# Patient Record
Sex: Male | Born: 1965 | Race: Black or African American | Hispanic: No | Smoking: Current every day smoker
Health system: Southern US, Community
[De-identification: ages and names within clinical notes are randomized; demographics above are authoritative.]

## PROBLEM LIST (undated history)

## (undated) DIAGNOSIS — I2510 Atherosclerotic heart disease of native coronary artery without angina pectoris: Secondary | ICD-10-CM

## (undated) DIAGNOSIS — I10 Essential (primary) hypertension: Secondary | ICD-10-CM

## (undated) DIAGNOSIS — I509 Heart failure, unspecified: Secondary | ICD-10-CM

## (undated) DIAGNOSIS — J189 Pneumonia, unspecified organism: Secondary | ICD-10-CM

## (undated) DIAGNOSIS — Z95810 Presence of automatic (implantable) cardiac defibrillator: Secondary | ICD-10-CM

## (undated) DIAGNOSIS — E7800 Pure hypercholesterolemia, unspecified: Secondary | ICD-10-CM

## (undated) DIAGNOSIS — I639 Cerebral infarction, unspecified: Secondary | ICD-10-CM

## (undated) DIAGNOSIS — E119 Type 2 diabetes mellitus without complications: Secondary | ICD-10-CM

## (undated) DIAGNOSIS — R0602 Shortness of breath: Secondary | ICD-10-CM

## (undated) DIAGNOSIS — I428 Other cardiomyopathies: Secondary | ICD-10-CM

---

## 1985-03-04 HISTORY — PX: FINGER AMPUTATION: SHX636

## 2005-07-30 ENCOUNTER — Emergency Department (HOSPITAL_COMMUNITY): Admission: EM | Admit: 2005-07-30 | Discharge: 2005-07-30 | Payer: Self-pay | Admitting: Family Medicine

## 2005-12-17 ENCOUNTER — Emergency Department (HOSPITAL_COMMUNITY): Admission: EM | Admit: 2005-12-17 | Discharge: 2005-12-17 | Payer: Self-pay | Admitting: Emergency Medicine

## 2006-04-11 ENCOUNTER — Inpatient Hospital Stay (HOSPITAL_COMMUNITY): Admission: EM | Admit: 2006-04-11 | Discharge: 2006-04-17 | Payer: Self-pay | Admitting: Emergency Medicine

## 2006-09-16 ENCOUNTER — Emergency Department (HOSPITAL_COMMUNITY): Admission: EM | Admit: 2006-09-16 | Discharge: 2006-09-16 | Payer: Self-pay | Admitting: Emergency Medicine

## 2007-05-20 ENCOUNTER — Emergency Department (HOSPITAL_COMMUNITY): Admission: EM | Admit: 2007-05-20 | Discharge: 2007-05-21 | Payer: Self-pay | Admitting: Emergency Medicine

## 2008-04-25 ENCOUNTER — Emergency Department (HOSPITAL_COMMUNITY): Admission: EM | Admit: 2008-04-25 | Discharge: 2008-04-25 | Payer: Self-pay | Admitting: Emergency Medicine

## 2009-10-18 ENCOUNTER — Emergency Department (HOSPITAL_COMMUNITY): Admission: EM | Admit: 2009-10-18 | Discharge: 2009-10-18 | Payer: Self-pay | Admitting: Family Medicine

## 2010-05-18 LAB — POCT RAPID STREP A (OFFICE): Streptococcus, Group A Screen (Direct): POSITIVE — AB

## 2010-06-19 LAB — COMPREHENSIVE METABOLIC PANEL
ALT: 17 U/L (ref 0–53)
AST: 14 U/L (ref 0–37)
Alkaline Phosphatase: 63 U/L (ref 39–117)
CO2: 15 mEq/L — ABNORMAL LOW (ref 19–32)
GFR calc Af Amer: >60 mL/min (ref >60)
Glucose, Bld: 229 mg/dL — ABNORMAL HIGH (ref 70–99)
Potassium: 4.6 mEq/L (ref 3.5–5.1)
Sodium: 133 mEq/L — ABNORMAL LOW (ref 135–145)
Total Protein: 7.8 g/dL (ref 6.0–8.3)

## 2010-06-19 LAB — DIFFERENTIAL
Basophils Relative: 1 % (ref 0–1)
Eosinophils Absolute: 0.1 10*3/uL (ref 0.0–0.7)
Eosinophils Relative: 1 % (ref 0–5)
Lymphs Abs: 1.9 10*3/uL (ref 0.7–4.0)
Monocytes Relative: 2 % — ABNORMAL LOW (ref 3–12)
Neutrophils Relative %: 77 % (ref 43–77)

## 2010-06-19 LAB — D-DIMER, QUANTITATIVE: D-Dimer, Quant: 1.02 ug/mL-FEU — ABNORMAL HIGH (ref 0.00–0.48)

## 2010-06-19 LAB — POCT CARDIAC MARKERS
CKMB, poc: 3.6 ng/mL (ref 1.0–8.0)
Troponin i, poc: <0.05 ng/mL (ref 0.00–0.09)

## 2010-06-19 LAB — CBC
Hemoglobin: 16.2 g/dL (ref 13.0–17.0)
RBC: 4.89 MIL/uL (ref 4.22–5.81)
RDW: 14.7 % (ref 11.5–15.5)

## 2010-07-20 NOTE — Discharge Summary (Signed)
Joseph [name], Joseph [name] NO.:  [id number]   MEDICAL RECORD NO.:  [id number]          PATIENT TYPE:  INP   LOCATION:  1410                         FACILITY:  [facility]   PHYSICIAN:  Michaelyn Barter, M.D. DATE OF BIRTH:  1965-12-07   DATE OF ADMISSION:  04/11/2006  DATE OF DISCHARGE:  04/17/2006                               DISCHARGE SUMMARY   This is a final discharge summary that will outline those events that  occurred during the time frame of February13 up until February14 only.  For events that occurred prior to February13, please see the discharge  summary dictated by Dr. Marcellus Scott.   FINAL DIAGNOSES:  1. Multilobar community-acquired pneumonia.  As of February13, the      patient indicated that he still has some shortness of breath but      stated that overall he felt significantly better.  By the date of      discharge his leukocytosis which had been as high as 35.3 earlier,      had declined to 11.9.  The patient remained afebrile during the      last 24 to 48 hours of his hospitalization.  2. With regard to hypoxemia, this resolved.  This more than likely was      secondary to the pneumonia.  By [id number], the patient's      oxygenation was 97% on 2 L of O2.  He showed no obvious signs or      symptoms of respiratory compromise during the latter portion of his      hospitalization.  3. With regards to hyperglycemia/questionable newly diagnosed diabetes      mellitus, the patient's sugars remained relatively stable during      the latter portion of his hospitalization.  It was decided that the      patient could follow up with his primary care physician once he is      discharged.  4. With regards to tachycardia, this resolved.  5. With regards to hyponatremia, by the date of discharge the      patient's sodium level was within the normal range and actually as      of February14, the patient's sodium was 142.   CONDITION AT THE TIME OF DISCHARGE:  On  the day of discharge, the  patient indicated that he felt better and stated that he was ready to be  discharged.   VITAL SIGNS:  Temperature was 98, heart rate 88, respirations 20, blood  pressure 130/86.   LAB WORK:  His white blood cell count was 11.9, hemoglobin 12.3,  hematocrit 36.2, platelets 598.  Sodium 142, potassium 4.6, chloride  110, CO2 of 27, BUN 11, creatinine 0.7, glucose 115, calcium 9.2.   DISCHARGE MEDICATIONS:  The patient was discharged home on:  1. Doxycycline 100 mg 1 tablet p.o. every 12 hours.  2. Mucinex 1 tablet p.o. every 12 hours.  3. Combivent MDI 2 puffs every 6 hours.  4. Azithromycin 250 mg tablets 1 tablet p.o. daily.   He was told to follow-up with [facility] within the next 2-4 weeks.  Michaelyn Barter, M.D.  Electronically Signed     OR/MEDQ  D:  04/29/2006  T:  04/29/2006  Job:  [id number]

## 2010-07-20 NOTE — H&P (Signed)
NAMEDALLEN, BUNTE NO.:  [id number]   MEDICAL RECORD NO.:  [id number]          PATIENT TYPE:  EMS   LOCATION:  ED                           FACILITY:  [facility]   PHYSICIAN:  Merlene Laughter. Renae Gloss, M.D.DATE OF BIRTH:  Sep 18, 1965   DATE OF ADMISSION:  04/11/2006  DATE OF DISCHARGE:                              HISTORY & PHYSICAL   This patient is unassigned.   CHIEF COMPLAINT:  Shortness of breath.   HISTORY OF PRESENT ILLNESS:  Mr. Bramel is a 45 year old gentleman who  presents with a one week history of progressively worsening chest pain,  productive cough, fever and chills.  He states he has not had previous  symptoms.  Pneumonia was noted on chest x-ray today, and he is being  admitted for further treatment.   PAST MEDICAL HISTORY:  None.   FAMILY HISTORY:  Significant for colon cancer in grandmother.   SOCIAL HISTORY:  Mr. Harbert is divorced.  He does report smoking a pack  of cigarettes a day for the past 20 years.  He denies drugs of abuse.  He reports drinking two beers a day.   ALLERGIES:  NKDA.   MEDICATIONS:  None.   REVIEW OF SYSTEMS:  As per HPI, greater than 10 systems reviewed.   PHYSICAL EXAMINATION:  GENERAL APPEARANCE:  Well-developed and well-  nourished male in no acute distress.  VITAL SIGNS:  Temperature, T max is 102.5, blood pressure 110/63,  respirations 22, heart rate 124.  HEENT:  No oropharyngeal lesions.  NECK:  Supple.  No masses.  Carotids 2+.  No bruits.  LUNGS:  Clear to auscultation bilaterally, however, with a few crackles  scattered bilaterally.  HEART:  S1 and S2.  Tachycardia.  No murmurs, rubs or gallops.  ABDOMEN:  Soft, nontender, nondistended.  Positive bowel sounds.  EXTREMITIES:  No clubbing, cyanosis or edema.  SKIN:  Warm.  Diaphoretic.  NEURO:  Alert and oriented x3.  Cranial nerves are intact.   LABORATORY DATA:  Chest x-ray reveals a left upper lobe pneumonia.   White cell count 40.9.  Sodium 131,  potassium 3, creatinine 1.19,  glucose 208.   ASSESSMENT/PLAN:  1. Community-acquired pneumonia:  Mr. Lawhorn will be started on IV      Rocephin and Zithromax.  Blood cultures have been obtained and are      pending.  2. Hyperglycemia:  The etiology of Mr. Gratz elevated glucose may      be due to stress from his acute illness; however, the possibility      of undiagnosed type 2 diabetes mellitus also exists.  Hemoglobin      A1C will be obtained during this admission.  3. Hypokalemia:  Potassium supplementation will be given.  4. Hyponatremia:  This is motor or sensory likely due to volume      depletion.  Fluid hydration will be given.           ______________________________  Merlene Laughter. Renae Gloss, M.D.     KRS/MEDQ  D:  04/11/2006  T:  04/11/2006  Job:  [id number]

## 2010-07-20 NOTE — H&P (Signed)
NAME:  DEMPSY, DAMIANO                ACCOUNT NO.:  [id number]   MEDICAL RECORD NO.:  [id number]          PATIENT TYPE:  INP   LOCATION:  0104                         FACILITY:  [facility]   PHYSICIAN:  Merlene Laughter. Renae Gloss, M.D.DATE OF BIRTH:  01/03/1966   DATE OF ADMISSION:  04/11/2006  DATE OF DISCHARGE:                              HISTORY & PHYSICAL   ADDENDUM:  This is a continuation of a previously dictated report.   ASSESSMENT/PLAN:  1. Tobacco dependence/abuse:  Mr. Grape will be placed on a nicotine      patch.  Ativan ordered as well.  He will receive tobacco cessation      education during this admission.           ______________________________  Merlene Laughter Renae Gloss, M.D.     KRS/MEDQ  D:  04/11/2006  T:  04/11/2006  Job:  [id number]

## 2010-07-20 NOTE — Discharge Summary (Signed)
NAME:  Joseph [name], Joseph [name] NO.:  [id number]   MEDICAL RECORD NO.:  [id number]          PATIENT TYPE:  INP   LOCATION:  1410                         FACILITY:  [facility]   PHYSICIAN:  Marcellus Scott, MD     DATE OF BIRTH:  10-Apr-1965   DATE OF ADMISSION:  04/11/2006  DATE OF DISCHARGE:                               DISCHARGE SUMMARY   INTERIM DISCHARGE SUMMARY:   PRIMARY CARE PHYSICIAN:  Patient is unassigned to the [facility] health  system.   DISCHARGE DIAGNOSES:  1. Multilobar community-acquired pneumonia.  2. Hypoxemia secondary to problem #1.  3. Tobacco abuse.  4. Hyperglycemia/query newly diagnosed diabetes.  5. Tachycardia.  6. Hyponatremia.  7. Hypokalemia.  8. Leukocytosis.   DISCHARGE MEDICATIONS:  To be determined on actual discharge.   PROCEDURES:  Chest x-ray on April 11, 2006.  Impression:  Left upper  lobe and superior segment left lower lobe air space disease, felt to be  most likely to represent infection, given the history of fever,  shortness of breath, and coughing.  Recommend radiographic followup to  confirm resolution in this patient with history of smoking.   CONSULTATIONS:  Diabetes treatment program.   HOSPITAL COURSE/PATIENT DISPOSITION:  For details of the initial part of  the admission, please refer to the history and physical note done by Dr.  Andi [name] on the 8th of February, 2008.  In summary, Joseph [name]  is a pleasant 45 year old African-American male patient with no past  medical history, presented with one week history of progressively  worsening chest pain, productive cough, fever and chills.  Further  evaluation revealed him to have community-acquired pneumonia.  Patient  was admitted to the hospital for further evaluation and management.   1. Multilobar community-acquired pneumonia: Patient was admitted to      the hospital.  His blood and sputum cultures were sent off.  He was      placed on empiric antibiotic  of IV ceftriaxone and Zithromax.  He      was also placed on bronchodilator nebs p.r.n., Mucinex, and oxygen.      With these measures, the patient has progressively done well with      decrease in his dyspnea, although he is still slightly dyspneic.      On ambulation, desatted yesterday to 87%.  His productive cough is      getting better, and the sputum color is clearing up.  He has no      fever.  His leukocytosis is improving.  Patient might need another      24-48 hours of IV antibiotics and then subsequently can be      discharged on oral antibiotics, maybe Avelox, to be followed up      closely as an outpatient.  Considering the multilobar presentation      of his pneumonia, patient had HIV testing after consent, which was      nonreactive.  Patient is in a monogamous relationship for five      years.   1. Hypoxemia secondary to problem #1.  It is improving  with decrease      in his oxygen requirement and should improve with improvement in      problem #1.   1. History of tobacco abuse:  Patient has been counseled against      tobacco use and placed on a nicotine patch, which has to be      continued as an outpatient and tapered appropriately.   1. Patient has been hyperglycemic in the hospital and has been placed      on sliding-scale insulin and received inpatient diabetes education.      Is questionable for a newly diagnostic diabetes.  He has to get an      outpatient diabetes education as well. Further workup as      outpatient.   1. Tachycardia, secondary to his pneumonia and hypoxia, has resolved.   1. Hyponatremia and hypokalemia secondary to his pneumonia and poor      p.o. intake. Repleted with IV fluids and electrolytes.   1. Leukocytosis secondary to his pneumonia:  It is improving overall.   Most recent lab data to be dictated at the time of discharge.      Marcellus Scott, MD  Electronically Signed     AH/MEDQ  D:  04/15/2006  T:  04/15/2006  Job:   [phone]

## 2013-06-28 ENCOUNTER — Encounter (HOSPITAL_COMMUNITY): Payer: Self-pay | Admitting: Emergency Medicine

## 2013-06-28 ENCOUNTER — Emergency Department (INDEPENDENT_AMBULATORY_CARE_PROVIDER_SITE_OTHER)
Admission: EM | Admit: 2013-06-28 | Discharge: 2013-06-28 | Disposition: A | Discharge status: Hospice | Payer: Medicaid Other | Source: Home / Self Care | Attending: Emergency Medicine | Admitting: Emergency Medicine

## 2013-06-28 ENCOUNTER — Inpatient Hospital Stay (HOSPITAL_COMMUNITY)
Admission: EM | Admit: 2013-06-28 | Discharge: 2013-07-01 | DRG: 286 | Disposition: A | Discharge status: Home / Self Care | Payer: Medicaid Other | Attending: Family Medicine | Admitting: Family Medicine

## 2013-06-28 ENCOUNTER — Emergency Department (HOSPITAL_COMMUNITY): Payer: Medicaid Other

## 2013-06-28 DIAGNOSIS — I959 Hypotension, unspecified: Secondary | ICD-10-CM

## 2013-06-28 DIAGNOSIS — Z79899 Other long term (current) drug therapy: Secondary | ICD-10-CM

## 2013-06-28 DIAGNOSIS — G8190 Hemiplegia, unspecified affecting unspecified side: Secondary | ICD-10-CM | POA: Diagnosis present

## 2013-06-28 DIAGNOSIS — J449 Chronic obstructive pulmonary disease, unspecified: Secondary | ICD-10-CM

## 2013-06-28 DIAGNOSIS — Z8673 Personal history of transient ischemic attack (TIA), and cerebral infarction without residual deficits: Secondary | ICD-10-CM | POA: Diagnosis not present

## 2013-06-28 DIAGNOSIS — I2510 Atherosclerotic heart disease of native coronary artery without angina pectoris: Secondary | ICD-10-CM

## 2013-06-28 DIAGNOSIS — Z23 Encounter for immunization: Secondary | ICD-10-CM

## 2013-06-28 DIAGNOSIS — E785 Hyperlipidemia, unspecified: Secondary | ICD-10-CM

## 2013-06-28 DIAGNOSIS — J189 Pneumonia, unspecified organism: Secondary | ICD-10-CM | POA: Diagnosis present

## 2013-06-28 DIAGNOSIS — E662 Morbid (severe) obesity with alveolar hypoventilation: Secondary | ICD-10-CM | POA: Diagnosis present

## 2013-06-28 DIAGNOSIS — J90 Pleural effusion, not elsewhere classified: Secondary | ICD-10-CM

## 2013-06-28 DIAGNOSIS — I428 Other cardiomyopathies: Secondary | ICD-10-CM | POA: Diagnosis present

## 2013-06-28 DIAGNOSIS — R748 Abnormal levels of other serum enzymes: Secondary | ICD-10-CM

## 2013-06-28 DIAGNOSIS — Z7982 Long term (current) use of aspirin: Secondary | ICD-10-CM

## 2013-06-28 DIAGNOSIS — F17200 Nicotine dependence, unspecified, uncomplicated: Secondary | ICD-10-CM | POA: Diagnosis present

## 2013-06-28 DIAGNOSIS — I110 Hypertensive heart disease with heart failure: Secondary | ICD-10-CM | POA: Diagnosis not present

## 2013-06-28 DIAGNOSIS — R0902 Hypoxemia: Secondary | ICD-10-CM

## 2013-06-28 DIAGNOSIS — R079 Chest pain, unspecified: Secondary | ICD-10-CM | POA: Diagnosis present

## 2013-06-28 DIAGNOSIS — F1010 Alcohol abuse, uncomplicated: Secondary | ICD-10-CM | POA: Diagnosis present

## 2013-06-28 DIAGNOSIS — I2489 Other forms of acute ischemic heart disease: Secondary | ICD-10-CM | POA: Diagnosis present

## 2013-06-28 DIAGNOSIS — Z5309 Procedure and treatment not carried out because of other contraindication: Secondary | ICD-10-CM

## 2013-06-28 DIAGNOSIS — I5043 Acute on chronic combined systolic (congestive) and diastolic (congestive) heart failure: Secondary | ICD-10-CM | POA: Diagnosis present

## 2013-06-28 DIAGNOSIS — G4733 Obstructive sleep apnea (adult) (pediatric): Secondary | ICD-10-CM | POA: Diagnosis present

## 2013-06-28 DIAGNOSIS — I248 Other forms of acute ischemic heart disease: Secondary | ICD-10-CM | POA: Diagnosis present

## 2013-06-28 DIAGNOSIS — F1410 Cocaine abuse, uncomplicated: Secondary | ICD-10-CM | POA: Diagnosis present

## 2013-06-28 DIAGNOSIS — R0989 Other specified symptoms and signs involving the circulatory and respiratory systems: Secondary | ICD-10-CM

## 2013-06-28 DIAGNOSIS — S68118A Complete traumatic metacarpophalangeal amputation of other finger, initial encounter: Secondary | ICD-10-CM | POA: Diagnosis not present

## 2013-06-28 DIAGNOSIS — I6350 Cerebral infarction due to unspecified occlusion or stenosis of unspecified cerebral artery: Secondary | ICD-10-CM | POA: Diagnosis present

## 2013-06-28 DIAGNOSIS — J4489 Other specified chronic obstructive pulmonary disease: Secondary | ICD-10-CM | POA: Diagnosis present

## 2013-06-28 DIAGNOSIS — I5042 Chronic combined systolic (congestive) and diastolic (congestive) heart failure: Secondary | ICD-10-CM

## 2013-06-28 DIAGNOSIS — I509 Heart failure, unspecified: Secondary | ICD-10-CM | POA: Diagnosis present

## 2013-06-28 DIAGNOSIS — I059 Rheumatic mitral valve disease, unspecified: Secondary | ICD-10-CM | POA: Diagnosis not present

## 2013-06-28 DIAGNOSIS — I6789 Other cerebrovascular disease: Secondary | ICD-10-CM | POA: Diagnosis not present

## 2013-06-28 DIAGNOSIS — E119 Type 2 diabetes mellitus without complications: Secondary | ICD-10-CM | POA: Diagnosis present

## 2013-06-28 DIAGNOSIS — I5023 Acute on chronic systolic (congestive) heart failure: Secondary | ICD-10-CM

## 2013-06-28 DIAGNOSIS — E669 Obesity, unspecified: Secondary | ICD-10-CM | POA: Diagnosis present

## 2013-06-28 DIAGNOSIS — R4789 Other speech disturbances: Secondary | ICD-10-CM | POA: Diagnosis present

## 2013-06-28 DIAGNOSIS — R0600 Dyspnea, unspecified: Secondary | ICD-10-CM

## 2013-06-28 DIAGNOSIS — R0609 Other forms of dyspnea: Secondary | ICD-10-CM

## 2013-06-28 DIAGNOSIS — G459 Transient cerebral ischemic attack, unspecified: Secondary | ICD-10-CM | POA: Diagnosis not present

## 2013-06-28 HISTORY — DX: Atherosclerotic heart disease of native coronary artery without angina pectoris: I25.10

## 2013-06-28 HISTORY — DX: Heart failure, unspecified: I50.9

## 2013-06-28 HISTORY — DX: Pure hypercholesterolemia, unspecified: E78.00

## 2013-06-28 HISTORY — DX: Other cardiomyopathies: I42.8

## 2013-06-28 HISTORY — DX: Essential (primary) hypertension: I10

## 2013-06-28 HISTORY — DX: Pneumonia, unspecified organism: J18.9

## 2013-06-28 HISTORY — DX: Shortness of breath: R06.02

## 2013-06-28 HISTORY — DX: Type 2 diabetes mellitus without complications: E11.9

## 2013-06-28 LAB — CBC WITH DIFFERENTIAL/PLATELET
BASOS PCT: 0 % (ref 0–1)
Basophils Absolute: 0 10*3/uL (ref 0.0–0.1)
EOS ABS: 0.2 10*3/uL (ref 0.0–0.7)
Eosinophils Relative: 2 % (ref 0–5)
HEMATOCRIT: 41.2 % (ref 39.0–52.0)
HEMOGLOBIN: 14.1 g/dL (ref 13.0–17.0)
Lymphocytes Relative: 27 % (ref 12–46)
Lymphs Abs: 2.4 10*3/uL (ref 0.7–4.0)
MCH: 34.2 pg — AB (ref 26.0–34.0)
MCHC: 34.2 g/dL (ref 30.0–36.0)
MCV: 100 fL (ref 78.0–100.0)
MONO ABS: 0.7 10*3/uL (ref 0.1–1.0)
MONOS PCT: 8 % (ref 3–12)
Neutro Abs: 5.6 10*3/uL (ref 1.7–7.7)
Neutrophils Relative %: 63 % (ref 43–77)
Platelets: 200 10*3/uL (ref 150–400)
RBC: 4.12 MIL/uL — ABNORMAL LOW (ref 4.22–5.81)
RDW: 13.8 % (ref 11.5–15.5)
WBC: 8.9 10*3/uL (ref 4.0–10.5)

## 2013-06-28 LAB — ETHANOL: Alcohol, Ethyl (B): <11 mg/dL (ref 0–11)

## 2013-06-28 LAB — COMPREHENSIVE METABOLIC PANEL
ALBUMIN: 3.4 g/dL — AB (ref 3.5–5.2)
ALK PHOS: 58 U/L (ref 39–117)
ALT: 33 U/L (ref 0–53)
AST: 23 U/L (ref 0–37)
BUN: 12 mg/dL (ref 6–23)
CHLORIDE: 103 meq/L (ref 96–112)
CO2: 20 meq/L (ref 19–32)
Calcium: 8.4 mg/dL (ref 8.4–10.5)
Creatinine, Ser: 0.82 mg/dL (ref 0.50–1.35)
GFR calc Af Amer: >90 mL/min (ref >90)
Glucose, Bld: 139 mg/dL — ABNORMAL HIGH (ref 70–99)
POTASSIUM: 4.4 meq/L (ref 3.7–5.3)
Sodium: 138 mEq/L (ref 137–147)
Total Bilirubin: 0.7 mg/dL (ref 0.3–1.2)
Total Protein: 6.6 g/dL (ref 6.0–8.3)

## 2013-06-28 LAB — LIPID PANEL
CHOL/HDL RATIO: 2.1 ratio
Cholesterol: 225 mg/dL — ABNORMAL HIGH (ref 0–200)
HDL: 109 mg/dL (ref >39)
LDL Cholesterol: 106 mg/dL — ABNORMAL HIGH (ref 0–99)
Triglycerides: 52 mg/dL (ref <150)
VLDL: 10 mg/dL (ref 0–40)

## 2013-06-28 LAB — TROPONIN I
Troponin I: <0.3 ng/mL (ref <0.30)
Troponin I: <0.3 ng/mL (ref <0.30)

## 2013-06-28 LAB — PRO B NATRIURETIC PEPTIDE: Pro B Natriuretic peptide (BNP): 955.8 pg/mL — ABNORMAL HIGH (ref 0–125)

## 2013-06-28 LAB — TSH: TSH: 0.562 u[IU]/mL (ref 0.350–4.500)

## 2013-06-28 LAB — HEMOGLOBIN A1C
Hgb A1c MFr Bld: 10.3 % — ABNORMAL HIGH (ref <5.7)
Mean Plasma Glucose: 249 mg/dL — ABNORMAL HIGH (ref <117)

## 2013-06-28 LAB — D-DIMER, QUANTITATIVE: D-Dimer, Quant: 0.61 ug/mL-FEU — ABNORMAL HIGH (ref 0.00–0.48)

## 2013-06-28 MED ORDER — METOPROLOL TARTRATE 12.5 MG HALF TABLET
12.5000 mg | ORAL_TABLET | Freq: Two times a day (BID) | ORAL | Status: DC
  Administered 2013-06-28: 12.5 mg via ORAL
  Filled 2013-06-28: qty 1

## 2013-06-28 MED ORDER — PREDNISONE 50 MG PO TABS
50.0000 mg | ORAL_TABLET | Freq: Every day | ORAL | Status: DC
  Administered 2013-06-29 – 2013-06-30 (×2): 50 mg via ORAL
  Filled 2013-06-28 (×3): qty 1

## 2013-06-28 MED ORDER — FUROSEMIDE 10 MG/ML IJ SOLN: 40.0000 mg | INTRAMUSCULAR | Status: DC

## 2013-06-28 MED ORDER — IPRATROPIUM BROMIDE 0.02 % IN SOLN
RESPIRATORY_TRACT | Status: AC
  Filled 2013-06-28: qty 2.5

## 2013-06-28 MED ORDER — SODIUM CHLORIDE 0.9 % IV SOLN: Freq: Once | INTRAVENOUS | Status: DC

## 2013-06-28 MED ORDER — FUROSEMIDE 10 MG/ML IJ SOLN
40.0000 mg | Freq: Once | INTRAMUSCULAR | Status: AC
  Administered 2013-06-28: 40 mg via INTRAVENOUS
  Filled 2013-06-28: qty 4

## 2013-06-28 MED ORDER — IOHEXOL 350 MG/ML SOLN
100.0000 mL | Freq: Once | INTRAVENOUS | Status: AC | PRN
  Administered 2013-06-28: 100 mL via INTRAVENOUS

## 2013-06-28 MED ORDER — NICOTINE 14 MG/24HR TD PT24
14.0000 mg | MEDICATED_PATCH | Freq: Every day | TRANSDERMAL | Status: DC
  Administered 2013-06-28 – 2013-07-01 (×4): 14 mg via TRANSDERMAL
  Filled 2013-06-28 (×4): qty 1

## 2013-06-28 MED ORDER — IPRATROPIUM-ALBUTEROL 0.5-2.5 (3) MG/3ML IN SOLN: 3.0000 mL | RESPIRATORY_TRACT | Status: DC | PRN

## 2013-06-28 MED ORDER — SODIUM CHLORIDE 0.9 % IV BOLUS (SEPSIS)
1000.0000 mL | Freq: Once | INTRAVENOUS | Status: AC
  Administered 2013-06-28: 1000 mL via INTRAVENOUS

## 2013-06-28 MED ORDER — ASPIRIN 81 MG PO CHEW
CHEWABLE_TABLET | ORAL | Status: AC
  Filled 2013-06-28: qty 4

## 2013-06-28 MED ORDER — IPRATROPIUM-ALBUTEROL 0.5-2.5 (3) MG/3ML IN SOLN
3.0000 mL | Freq: Once | RESPIRATORY_TRACT | Status: AC
  Administered 2013-06-28: 3 mL via RESPIRATORY_TRACT
  Filled 2013-06-28: qty 3

## 2013-06-28 MED ORDER — NITROGLYCERIN 0.4 MG SL SUBL: 0.4000 mg | SUBLINGUAL_TABLET | SUBLINGUAL | Status: DC | PRN

## 2013-06-28 MED ORDER — DEXTROSE 5 % IV SOLN: 1.0000 g | Freq: Once | INTRAVENOUS | Status: DC

## 2013-06-28 MED ORDER — ASPIRIN EC 325 MG PO TBEC
325.0000 mg | DELAYED_RELEASE_TABLET | Freq: Every day | ORAL | Status: DC
  Administered 2013-06-28: 325 mg via ORAL
  Filled 2013-06-28 (×2): qty 1

## 2013-06-28 MED ORDER — HEPARIN SODIUM (PORCINE) 5000 UNIT/ML IJ SOLN
5000.0000 [IU] | Freq: Three times a day (TID) | INTRAMUSCULAR | Status: DC
  Administered 2013-06-28 – 2013-06-30 (×5): 5000 [IU] via SUBCUTANEOUS
  Filled 2013-06-28 (×7): qty 1

## 2013-06-28 MED ORDER — LEVOFLOXACIN 750 MG PO TABS
750.0000 mg | ORAL_TABLET | Freq: Every day | ORAL | Status: DC
  Administered 2013-06-28 – 2013-06-29 (×2): 750 mg via ORAL
  Filled 2013-06-28 (×2): qty 1

## 2013-06-28 MED ORDER — FUROSEMIDE 40 MG PO TABS
40.0000 mg | ORAL_TABLET | Freq: Every day | ORAL | Status: DC
  Administered 2013-06-29 – 2013-07-01 (×3): 40 mg via ORAL
  Filled 2013-06-28 (×3): qty 1

## 2013-06-28 MED ORDER — ALBUTEROL SULFATE (2.5 MG/3ML) 0.083% IN NEBU
5.0000 mg | INHALATION_SOLUTION | Freq: Once | RESPIRATORY_TRACT | Status: AC
  Administered 2013-06-28: 5 mg via RESPIRATORY_TRACT

## 2013-06-28 MED ORDER — DEXTROSE 5 % IV SOLN: 500.0000 mg | Freq: Once | INTRAVENOUS | Status: DC

## 2013-06-28 MED ORDER — MORPHINE SULFATE 2 MG/ML IJ SOLN: 1.0000 mg | INTRAMUSCULAR | Status: DC | PRN

## 2013-06-28 MED ORDER — FUROSEMIDE 40 MG PO TABS: 40.0000 mg | ORAL_TABLET | Freq: Once | ORAL | Status: DC

## 2013-06-28 MED ORDER — METHYLPREDNISOLONE SODIUM SUCC 125 MG IJ SOLR
125.0000 mg | Freq: Once | INTRAMUSCULAR | Status: AC
  Administered 2013-06-28: 125 mg via INTRAVENOUS
  Filled 2013-06-28: qty 2

## 2013-06-28 MED ORDER — NITROGLYCERIN 0.4 MG SL SUBL
SUBLINGUAL_TABLET | SUBLINGUAL | Status: AC
  Filled 2013-06-28: qty 1

## 2013-06-28 MED ORDER — ASPIRIN 81 MG PO CHEW
324.0000 mg | CHEWABLE_TABLET | Freq: Once | ORAL | Status: AC
  Administered 2013-06-28: 324 mg via ORAL

## 2013-06-28 MED ORDER — IPRATROPIUM BROMIDE 0.02 % IN SOLN
0.5000 mg | Freq: Once | RESPIRATORY_TRACT | Status: AC
  Administered 2013-06-28: 0.5 mg via RESPIRATORY_TRACT

## 2013-06-28 MED ORDER — ALBUTEROL SULFATE (2.5 MG/3ML) 0.083% IN NEBU
INHALATION_SOLUTION | RESPIRATORY_TRACT | Status: AC
  Filled 2013-06-28: qty 6

## 2013-06-28 NOTE — ED Notes (Signed)
Per [facility], pt went to UC x3-4 days chest pain. Given 2 sl nitro, no change. 12 lead sinus tach. Pt given breathing treatment due to wheezing. No wheezing noted by [facility]. Pt on 2L [place], smoker history. Sat 92%.

## 2013-06-28 NOTE — Discharge Planning (Signed)
L4DC Felicia E, Community Liaison  Hospital follow up appointment made with [facility] for Wed May 6,2015 at 4:00pm. Patient was given the orange card application and resource guide. I expressed to patient and family at bedside that the new patient packet needed to be picked up from the [place] prior to the pt's appointment. Patient aware of this appointment, my contact information was provided for any future questions or concerns. No other needs expressed at this time.

## 2013-06-28 NOTE — H&P (Signed)
Attending Addendum  I examined the patient and discussed the assessment and plan with Dr. Berkley Harvey and Caryl Bis. I have reviewed the note and agree.  Briefly, 48 yo M with hx of HTN of unknown duration and smoking admitted for one week of gradually worsening SOB found to have cardiomegaly and elevated proBNP concerning for new onset L sided CHF exacerbation. He denies cough, chest pain, back pain, weight gain and swelling.   BP 126/77  Pulse 108  Temp(Src) 98.5 F (36.9 C) (Oral)  Resp 21  Wt 183 lb 6.4 oz (83.19 kg)  SpO2 96% General appearance: alert, cooperative and no distress, moderately obese Neck: no JVD and supple, symmetrical, trachea midline Lungs: clear to auscultation bilaterally Heart: regular rate and rhythm, S1, S2 normal, no murmur, click, rub or gallop Extremities: no edema.  A/P: 48 yo M smoker with dyspnea, cardiomegaly and elevated proBNP consistent with new onset CHF exacerbation. Positive D-dimer, CTA negative for PE.   1. CHF exacerbation: Patient hypertensive on admission. Improved with lasix. Plan to admit to tele, ECHO, diuresis, TSH. ACS r/o although gradual onset and progression of symptoms argue against acute coronary syndrome. Cycle troponin. PNA possible, no fever, elevated WBC or cough prior to onset following albuterol, so unlikely.  Start low dose BB, cardioselective Start ACEi Nicotine patch for smoking cessation, patient willing and feels able to quit. Nutrition consult. Anticipate cardiology consult after ECHO reviewed to help evaluate coronary arteries. Patient obese-will need outpatient sleep study. Establishing care with family medicine.      Boykin Nearing, [place]

## 2013-06-28 NOTE — H&P (Signed)
[facility] Admission History and Physical Service Pager: [phone]  Patient name: Joseph [name] Medical record number: [id number] Date of birth: 11-Jun-1965 Age: 48 y.o. Gender: male  Primary Care Provider: No PCP Per Patient Consultants: Cardiology Code Status: full  Chief Complaint: CP and Dyspnesa  Assessment and Plan: PRATT BRESS is a 48 y.o. male presenting with new onset CHF and possible COPD exacerbation vs CAP. PMH is significant for  ~ 30 pack year smoking Hx, and no medical care in past 3 years.   # Chest Pain - Substernal "chest pressure" last ~ 5 days associated with Dyspnea and orthopnea; No exertional component. Tachypnea and Tachycardia in ED with positive D-dimer, but CT Angio negative for PE. itrop negative in ED and EKG shows NSR w/ nonspecific Twave abnormalities (but incomplete upload in EPIC). CXR showed b/l pleural effusion; possible lower lobe PNA. BNP elevated. Afebrile, WBC wnl, Hgb wnl  - Most likely new onset HF with possible COPD vs CAP  - Oxygen to maintain saturations 88-92%; Currently on 4L   - Strict I&Os and daily weights - Check BAC and UDS - Cycle troponin's and repeat EKG in AM to rule out ACS - ECHO ordered - ASA given, Morphine prn of pain - Levaquin and Prednisone x 5 days (4/27>>)  - Duonebs q4hrs prn - Lasix 40 mg PO (Will give second dose this evening given tachpnea, tachycardia and new O2 requirement then daily starting tomorrow - Risk Stat labs: TSH, Lipids, A1c   # Tobacco Abuse, Possible COPD - Report ~ 30 pack year smoking Hx; No diagnosis of COPD, but no medical care > 3 years - Recommend PFTs as out patient once acute dyspnea resolves  - Duonebs as above; Hold off on Dulera until PFTs - Nicotine patch  # Elevated Glucose - No Hx of DM, Possibly elevated due to steroids received in ED - Check A1c  FEN/GI: Heart health diet- NPO after midnight for cardiology evaluation; SLIV Prophylaxis: heparin  subq  Disposition: Admit for observation to telemetry; Discharge pending cardiac work-up  History of Present Illness: Joseph [name] is a 48 y.o. male presenting with non-radiating substernal CP "pressure" and SOB that has been gradually getting worse since Wednesday. Denies any fevers, chills, cough, viral symptoms, palpitations, LE edema or GERD symptoms. Endorses two pillow orthopnea, ~ 30 pack year smoking Hx, and alcohol use, but denies Illicit drug use. He denies any current medical problems and doesn't take any medications but hasn't been to the doctor in several years.   Review Of Systems: Per HPI with the following additions:  Otherwise 12 point review of systems was performed and was unremarkable.  Patient Active Problem List   Diagnosis Date Noted  . CHF (congestive heart failure) 06/28/2013   Past Medical History: History reviewed. No pertinent past medical history. Past Surgical History: History reviewed. No pertinent past surgical history. Social History: History  Substance Use Topics  . Smoking status: Current Every Day Smoker  . Smokeless tobacco: Not on file  . Alcohol Use: Yes   Additional social history:  Please also refer to relevant sections of EMR.  Family History: No family history on file. Allergies and Medications: No Known Allergies No current facility-administered medications on file prior to encounter.   No current outpatient prescriptions on file prior to encounter.    Objective: BP 153/93  Pulse 120  Temp(Src) 98.5 F (36.9 C) (Oral)  Resp 22  SpO2 93% Exam: General: AA obese male;  NAD HEENT: MMM; PERRL; Pharynx clear Cardiovascular: RRR no m/r/g; pulse 2+ Respiratory: B/l bibasilar rales to mid thorax; mild tachypnea on 4L [place] O2; able to speak in full sentences  Abdomen: SNTND; BS +;  Extremities: WWP; No LE Edema Skin: No rashes  Labs and Imaging: CBC BMET   Recent Labs Lab 06/28/13 0949  WBC 8.9  HGB 14.1  HCT 41.2  PLT 200     Recent Labs Lab 06/28/13 0949  NA 138  K 4.4  CL 103  CO2 20  BUN 12  CREATININE 0.82  GLUCOSE 139*  CALCIUM 8.4       Recent Labs Lab 06/28/13 0949  TROPONINI <0.30   BNP    Component Value Date/Time   PROBNP 955.8* 06/28/2013 0949   Ddimer: 0.61  Ct Angio Chest W/cm &/or Wo Cm 06/28/2013   IMPRESSION: Small to moderate bilateral pleural effusions.  Cardiomegaly.  Bilateral lower lobe airspace opacities along with diffuse interstitial thickening. I favor this represents Edema/CHF. Bilateral lower lobe pneumonia cannot be completely excluded but felt less likely.  No evidence of pulmonary embolus.  Borderline and mildly enlarged mesenteric lymph nodes, presumably reactive. These could be followed with repeat CT in 3-6 months.     Dg Chest Portable 1 View 06/28/2013    IMPRESSION: Mild enlargement of cardiac silhouette.  Suspected mild pulmonary edema though infection not entirely excluded.     Phill Myron, MD 06/28/2013, 3:32 PM PGY-1, [place] Intern pager: [phone], text pages welcome

## 2013-06-28 NOTE — ED Provider Notes (Signed)
Date: 06/28/2013  Rate: 117  Rhythm: sinus tachycardia  QRS Axis: normal  Intervals: normal  ST/T Wave abnormalities: nonspecific ST changes  Conduction Disutrbances:none  Narrative Interpretation:   Old EKG Reviewed: tachycardia new     Wandra Arthurs, MD 06/28/13 1016

## 2013-06-28 NOTE — ED Notes (Signed)
Spoke with Hoyle Sauer from service response.  Heart healthy diet ordered for pt.

## 2013-06-28 NOTE — ED Provider Notes (Signed)
CSN: [id number]     Arrival date & time 06/28/13  4259 History   First MD Initiated Contact with Patient 06/28/13 [phone]     Chief Complaint  Patient presents with  . Chest Pain     (Consider location/radiation/quality/duration/timing/severity/associated sxs/prior Treatment) HPI Comments: Patient is a 48 year old male past medical history significant for hypertension, tobacco use presented to the emergency department for 3 days of gradually worsening shortness of breath, central nonradiating chest pressure. Patient states he has not tried anything at home for his breathing or pain. Denies any aggravating factors. Patient denies a history of CP or SOB like this in the past. He denies any associated diaphoresis, nausea, vomiting, abdominal pain, numbness or weakness to his arms or legs, cough, edema. No early familial cardiac history. No immediate familial cardiac history either. No hx of PE or DVT.   Patient is a 48 y.o. male presenting with chest pain.  Chest Pain Associated symptoms: shortness of breath   Associated symptoms: no abdominal pain, no nausea, no palpitations and not vomiting     History reviewed. No pertinent past medical history. History reviewed. No pertinent past surgical history. No family history on file. History  Substance Use Topics  . Smoking status: Current Every Day Smoker  . Smokeless tobacco: Not on file  . Alcohol Use: Yes    Review of Systems  Respiratory: Positive for shortness of breath.   Cardiovascular: Positive for chest pain. Negative for palpitations and leg swelling.  Gastrointestinal: Negative for nausea, vomiting and abdominal pain.  All other systems reviewed and are negative.     Allergies  Review of patient's allergies indicates no known allergies.  Home Medications   Prior to Admission medications   Not on File   BP 127/72  Pulse 110  Temp(Src) 98.5 F (36.9 C) (Oral)  Resp 19  SpO2 94% Physical Exam  Nursing note and vitals  reviewed. Constitutional: He is oriented to person, place, and time. He appears well-developed and well-nourished.  HENT:  Head: Normocephalic and atraumatic.  Right Ear: External ear normal.  Left Ear: External ear normal.  Nose: Nose normal.  Mouth/Throat: Uvula is midline, oropharynx is clear and moist and mucous membranes are normal. No oropharyngeal exudate.  Eyes: Conjunctivae are normal.  Neck: Neck supple.  Cardiovascular: Regular rhythm and normal heart sounds.   Pulmonary/Chest: Accessory muscle usage present. No stridor. He is in respiratory distress. He has decreased breath sounds. He exhibits tenderness.  Abdominal: Soft. Bowel sounds are normal. There is no tenderness.  Musculoskeletal: Normal range of motion. He exhibits no edema.  Lymphadenopathy:    He has no cervical adenopathy.  Neurological: He is alert and oriented to person, place, and time.  Skin: Skin is warm and dry.    ED Course  Procedures (including critical care time) Medications  ipratropium-albuterol (DUONEB) 0.5-2.5 (3) MG/3ML nebulizer solution 3 mL (3 mLs Nebulization Given 06/28/13 0945)  sodium chloride 0.9 % bolus 1,000 mL (0 mLs Intravenous Stopped 06/28/13 1020)  methylPREDNISolone sodium succinate (SOLU-MEDROL) 125 mg/2 mL injection 125 mg (125 mg Intravenous Given 06/28/13 0944)  ipratropium-albuterol (DUONEB) 0.5-2.5 (3) MG/3ML nebulizer solution 3 mL (3 mLs Nebulization Given 06/28/13 1031)  iohexol (OMNIPAQUE) 350 MG/ML injection 100 mL (100 mLs Intravenous Contrast Given 06/28/13 1328)  furosemide (LASIX) injection 40 mg (40 mg Intravenous Given 06/28/13 1431)    Labs Review Labs Reviewed  PRO B NATRIURETIC PEPTIDE - Abnormal; Notable for the following:    Pro B Natriuretic peptide (BNP)  955.8 (*)    All other components within normal limits  CBC WITH DIFFERENTIAL - Abnormal; Notable for the following:    RBC 4.12 (*)    MCH 34.2 (*)    All other components within normal limits   COMPREHENSIVE METABOLIC PANEL - Abnormal; Notable for the following:    Glucose, Bld 139 (*)    Albumin 3.4 (*)    All other components within normal limits  D-DIMER, QUANTITATIVE - Abnormal; Notable for the following:    D-Dimer, Quant 0.61 (*)    All other components within normal limits  TROPONIN I    Imaging Review Ct Angio Chest W/cm &/or Wo Cm  06/28/2013   CLINICAL DATA:  Chest pain, shortness of breath.  EXAM: CT ANGIOGRAPHY CHEST WITH CONTRAST  TECHNIQUE: Multidetector CT imaging of the chest was performed using the standard protocol during bolus administration of intravenous contrast. Multiplanar CT image reconstructions and MIPs were obtained to evaluate the vascular anatomy.  CONTRAST:  134mL OMNIPAQUE IOHEXOL 350 MG/ML SOLN  COMPARISON:  04/25/2008  FINDINGS: No filling defects in the pulmonary arteries to suggest pulmonary emboli. There are small to moderate bilateral pleural effusions. Bilateral lower lobe airspace opacities are noted. Mild interstitial prominence/ thickening. There is cardiomegaly. Scattered coronary artery calcifications. Aorta is normal caliber. No aneurysm or dissection.  Mildly prominent scattered mediastinal lymph nodes. Index right paratracheal lymph node has a short axis diameter of 14 mm. Other similarly sized mediastinal lymph nodes scattered throughout the mediastinum. These are presumably reactive/ related to congestion.  Chest wall soft tissues are unremarkable. Imaging into the upper abdomen shows no acute findings. No acute bony abnormality or focal bone lesion.  Review of the MIP images confirms the above findings.  IMPRESSION: Small to moderate bilateral pleural effusions.  Cardiomegaly.  Bilateral lower lobe airspace opacities along with diffuse interstitial thickening. I favor this represents Edema/CHF. Bilateral lower lobe pneumonia cannot be completely excluded but felt less likely.  No evidence of pulmonary embolus.  Borderline and mildly enlarged  mesenteric lymph nodes, presumably reactive. These could be followed with repeat CT in 3-6 months.   Electronically Signed   By: Rolm Baptise M.D.   On: 06/28/2013 13:59   Dg Chest Portable 1 View  06/28/2013   CLINICAL DATA:  Chest pain, shortness of breath, smoker  EXAM: PORTABLE CHEST - 1 VIEW  COMPARISON:  Portable exam 0942 hr compared to 04/25/2008  FINDINGS: Enlargement of cardiac silhouette.  Mediastinal contours and pulmonary vascularity normal.  Interstitial infiltrates at the mid to lower lungs question pulmonary edema.  No gross pleural effusion or pneumothorax.  Osseous structures unremarkable.  IMPRESSION: Mild enlargement of cardiac silhouette.  Suspected mild pulmonary edema though infection not entirely excluded.   Electronically Signed   By: Lavonia Dana M.D.   On: 06/28/2013 09:52     EKG Interpretation None      MDM   Final diagnoses:  CHF exacerbation  Hypoxia    Filed Vitals:   06/28/13 1400  BP: 127/72  Pulse: 110  Temp:   Resp: 19   Patient received 324mg  [facility] at [facility] and 3 SL Nitro at [facility] w/ no improvement of his symptoms. He endorses the Duoneb improved his symptoms.   Patient is a 48 yo M PMHx significant for HTN and tobacco use presenting to the emergency department for 3 days of central chest pressure and shortness of breath. Upon arrival to the emergency department patient with respiratory distress, accessory muscle use, decreased  breath sounds, tachycardic. Patient with new oxygen requirement, unable to maintain oxygen saturations above 90% on room air this is new for the patient he does not wear oxygen at home. Patient versus improvement after 3 DuoNeb and first improvement of symptoms, but still had mild accessory muscle use, lungs with better air exchange, mild expiratory wheeze noted. IV Solu-Medrol. X-ray questionable edema versus pneumonia. D-dimer was elevated, CT scan obtained no evidence of PE, CHF, edema and questionable pneumonia noted. Lasix,  Rocephin, azithromycin started. Patient will be admitted for new CHF with edema, possible community-acquired pneumonia, hypoxia. Patient d/w with Dr. Darl Householder, agrees with plan.    Harlow Mares, PA-C 06/28/13 1507

## 2013-06-28 NOTE — ED Provider Notes (Signed)
Medical screening examination/treatment/procedure(s) were performed by non-physician practitioner and as supervising physician I was immediately available for consultation/collaboration.  Philipp Deputy, M.D.  Harden Mo, MD 06/28/13 [phone]

## 2013-06-28 NOTE — ED Provider Notes (Signed)
CSN: [id number]     Arrival date & time 06/28/13  0803 History   First MD Initiated Contact with Patient 06/28/13 0815     Chief Complaint  Patient presents with  . Chest Pain   (Consider location/radiation/quality/duration/timing/severity/associated sxs/prior Treatment) Patient is a 48 y.o. male presenting with chest pain. The history is provided by the patient.  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 Associated symptoms: shortness of breath   Associated symptoms: no abdominal pain, no back pain, no claudication, no cough, no diaphoresis, no dizziness, no fever, no heartburn, no nausea, no near-syncope, no numbness, no orthopnea, no palpitations, no syncope, not vomiting and no weakness   Risk factors: hypertension, male sex, obesity and smoking   Risk factors: no coronary artery disease, no diabetes mellitus and no prior DVT/PE   Risk factors comment:  No report of familiy hx of early heart disease   History reviewed. No pertinent past medical history. No past surgical history on file. No family history on file. History  Substance Use Topics  . Smoking status: Not on file  . Smokeless tobacco: Not on file  . Alcohol Use: Not on file    Review of Systems  Constitutional: Negative for fever and diaphoresis.  HENT: Negative.   Eyes: Negative.   Respiratory: Positive for chest tightness and shortness of breath. Negative for cough and wheezing.   Cardiovascular: Positive for chest pain. Negative for palpitations, orthopnea, claudication, syncope and near-syncope.  Gastrointestinal: Negative for heartburn, nausea,  vomiting and abdominal pain.  Genitourinary: Negative.   Musculoskeletal: Negative for back pain.  Neurological: Negative for dizziness, weakness and numbness.    Allergies  Review of patient's allergies indicates no known allergies.  Home Medications   Prior to Admission medications   Not on File   BP 171/105  Pulse 122  Temp(Src) 97.2 F (36.2 C) (Oral)  Resp 22  SpO2 90% Physical Exam  Nursing note and vitals reviewed. Constitutional: He is oriented to person, place, and time. He appears well-developed and well-nourished. No distress.  HENT:  Head: Normocephalic and atraumatic.  Eyes: Conjunctivae are normal.  Neck: Normal range of motion. Neck supple. No JVD present.  Cardiovascular: Regular rhythm and normal heart sounds.  Tachycardia present.   Pulmonary/Chest: Effort normal. He has wheezes in the right middle field and the left middle field.  +mild tachypnea  Abdominal: Soft. Bowel sounds are normal.  Musculoskeletal: Normal range of motion.  Neurological: He is alert and oriented to person, place, and time.  Skin: Skin is warm and dry. No rash noted.  Psychiatric: He has a normal mood and affect. His behavior is normal.    ED Course  Angiocath insertion Date/Time: 06/28/2013 8:39 AM Performed by: Griselda Miner LEE Authorized by: Philipp Deputy C Consent: Verbal consent obtained. written consent not obtained. Risks and benefits: risks, benefits and alternatives were discussed Consent given by: patient Patient understanding: patient states understanding of the procedure being performed Patient identity confirmed: verbally with patient and arm band Preparation: Patient was prepped and draped in the usual sterile fashion. Local anesthesia used: no Patient sedated: no Patient tolerance: Patient tolerated  the procedure well with no immediate complications. Comments: 20g angiocath placed at right AC fossa   (including critical care time) Labs Review Labs  Reviewed - No data to display  Imaging Review No results found.   MDM   1. Chest pain   2. Dyspnea   Exertional chest pressure with associated dyspnea in 48 y/o hypertensive AA male. ECG: ST at 119 bpm with non-specific T wave changes.  Moderate bronchospasm on exam of patient. Patient is a smoker. No pedal edema/rales/calf pain or swelling. IV NS placed. Given Albuterol 5mg /Atrovent 0.5mg  neg tx, 324mg  aspirin, placed on monitor and transfer to [facility].       [place], [place] 06/28/13 [phone]

## 2013-06-28 NOTE — ED Notes (Signed)
PA at bedside.

## 2013-06-28 NOTE — ED Notes (Signed)
Pt returned from CT °

## 2013-06-28 NOTE — ED Notes (Signed)
C/o chest pain States its hard to breathe States pain has been for 3-4 days  States he has tried some cold medicine

## 2013-06-28 NOTE — ED Notes (Signed)
IV start attempted x 2 by this writer w/o success 

## 2013-06-28 NOTE — ED Notes (Signed)
Respiratory paged to assess patient for possible bipap.

## 2013-06-29 ENCOUNTER — Encounter (HOSPITAL_COMMUNITY): Payer: Self-pay | Admitting: General Practice

## 2013-06-29 DIAGNOSIS — I059 Rheumatic mitral valve disease, unspecified: Secondary | ICD-10-CM

## 2013-06-29 DIAGNOSIS — I509 Heart failure, unspecified: Secondary | ICD-10-CM

## 2013-06-29 DIAGNOSIS — R0902 Hypoxemia: Secondary | ICD-10-CM

## 2013-06-29 DIAGNOSIS — F1410 Cocaine abuse, uncomplicated: Secondary | ICD-10-CM

## 2013-06-29 DIAGNOSIS — R079 Chest pain, unspecified: Secondary | ICD-10-CM

## 2013-06-29 DIAGNOSIS — E119 Type 2 diabetes mellitus without complications: Secondary | ICD-10-CM

## 2013-06-29 LAB — RAPID URINE DRUG SCREEN, HOSP PERFORMED
Amphetamines: NOT DETECTED
BARBITURATES: NOT DETECTED
BENZODIAZEPINES: NOT DETECTED
Cocaine: POSITIVE — AB
Opiates: NOT DETECTED
Tetrahydrocannabinol: NOT DETECTED

## 2013-06-29 LAB — CBC
HEMATOCRIT: 45 % (ref 39.0–52.0)
Hemoglobin: 15.1 g/dL (ref 13.0–17.0)
MCH: 34 pg (ref 26.0–34.0)
MCHC: 33.6 g/dL (ref 30.0–36.0)
MCV: 101.4 fL — ABNORMAL HIGH (ref 78.0–100.0)
PLATELETS: 209 10*3/uL (ref 150–400)
RBC: 4.44 MIL/uL (ref 4.22–5.81)
RDW: 14 % (ref 11.5–15.5)
WBC: 13 10*3/uL — AB (ref 4.0–10.5)

## 2013-06-29 LAB — BASIC METABOLIC PANEL
BUN: 16 mg/dL (ref 6–23)
CHLORIDE: 101 meq/L (ref 96–112)
CO2: 22 mEq/L (ref 19–32)
Calcium: 9.3 mg/dL (ref 8.4–10.5)
Creatinine, Ser: 0.98 mg/dL (ref 0.50–1.35)
GFR calc Af Amer: >90 mL/min (ref >90)
Glucose, Bld: 189 mg/dL — ABNORMAL HIGH (ref 70–99)
POTASSIUM: 4 meq/L (ref 3.7–5.3)
Sodium: 138 mEq/L (ref 137–147)

## 2013-06-29 LAB — GLUCOSE, CAPILLARY
GLUCOSE-CAPILLARY: 190 mg/dL — AB (ref 70–99)
GLUCOSE-CAPILLARY: 139 mg/dL — AB (ref 70–99)
Glucose-Capillary: 342 mg/dL — ABNORMAL HIGH (ref 70–99)
Glucose-Capillary: 90 mg/dL (ref 70–99)

## 2013-06-29 LAB — PROTIME-INR
INR: 0.97 (ref 0.00–1.49)
Prothrombin Time: 12.7 seconds (ref 11.6–15.2)

## 2013-06-29 LAB — TROPONIN I

## 2013-06-29 MED ORDER — INSULIN ASPART 100 UNIT/ML ~~LOC~~ SOLN: 0.0000 [IU] | Freq: Every day | SUBCUTANEOUS | Status: DC

## 2013-06-29 MED ORDER — ASPIRIN 81 MG PO CHEW
324.0000 mg | CHEWABLE_TABLET | ORAL | Status: AC
  Administered 2013-06-30: 324 mg via ORAL
  Filled 2013-06-29: qty 4

## 2013-06-29 MED ORDER — SODIUM CHLORIDE 0.9 % IJ SOLN
3.0000 mL | Freq: Two times a day (BID) | INTRAMUSCULAR | Status: DC
  Administered 2013-06-29: 3 mL via INTRAVENOUS

## 2013-06-29 MED ORDER — SODIUM CHLORIDE 0.9 % IV SOLN
INTRAVENOUS | Status: DC
  Administered 2013-06-30: 06:00:00 via INTRAVENOUS

## 2013-06-29 MED ORDER — INSULIN ASPART 100 UNIT/ML ~~LOC~~ SOLN
0.0000 [IU] | Freq: Three times a day (TID) | SUBCUTANEOUS | Status: DC
Start: 2013-06-29 — End: 2013-07-01
  Administered 2013-06-29: 11 [IU] via SUBCUTANEOUS
  Administered 2013-06-30: 3 [IU] via SUBCUTANEOUS
  Administered 2013-06-30: 11 [IU] via SUBCUTANEOUS
  Administered 2013-07-01: 3 [IU] via SUBCUTANEOUS

## 2013-06-29 MED ORDER — ATORVASTATIN CALCIUM 40 MG PO TABS
40.0000 mg | ORAL_TABLET | Freq: Every day | ORAL | Status: DC
  Administered 2013-06-29 – 2013-07-01 (×3): 40 mg via ORAL
  Filled 2013-06-29 (×3): qty 1

## 2013-06-29 MED ORDER — SODIUM CHLORIDE 0.9 % IJ SOLN: 3.0000 mL | INTRAMUSCULAR | Status: DC | PRN

## 2013-06-29 MED ORDER — DIAZEPAM 5 MG PO TABS
5.0000 mg | ORAL_TABLET | ORAL | Status: AC
  Administered 2013-06-30: 5 mg via ORAL
  Filled 2013-06-29: qty 1

## 2013-06-29 MED ORDER — METFORMIN HCL 500 MG PO TABS
500.0000 mg | ORAL_TABLET | Freq: Every day | ORAL | Status: DC
  Administered 2013-06-29 – 2013-06-30 (×2): 500 mg via ORAL
  Filled 2013-06-29 (×3): qty 1

## 2013-06-29 MED ORDER — SODIUM CHLORIDE 0.9 % IV SOLN: 250.0000 mL | INTRAVENOUS | Status: DC | PRN

## 2013-06-29 MED ORDER — INSULIN ASPART 100 UNIT/ML ~~LOC~~ SOLN
0.0000 [IU] | SUBCUTANEOUS | Status: DC
  Administered 2013-06-29: 3 [IU] via SUBCUTANEOUS
  Administered 2013-06-29: 2 [IU] via SUBCUTANEOUS

## 2013-06-29 MED ORDER — ASPIRIN 81 MG PO CHEW
81.0000 mg | CHEWABLE_TABLET | Freq: Every day | ORAL | Status: DC
  Administered 2013-06-29: 81 mg via ORAL
  Filled 2013-06-29: qty 1

## 2013-06-29 MED ORDER — PNEUMOCOCCAL VAC POLYVALENT 25 MCG/0.5ML IJ INJ
0.5000 mL | INJECTION | INTRAMUSCULAR | Status: AC
  Administered 2013-06-30: 0.5 mL via INTRAMUSCULAR
  Filled 2013-06-29: qty 0.5

## 2013-06-29 MED ORDER — INSULIN ASPART 100 UNIT/ML ~~LOC~~ SOLN
4.0000 [IU] | Freq: Three times a day (TID) | SUBCUTANEOUS | Status: DC
  Administered 2013-06-29 – 2013-07-01 (×4): 4 [IU] via SUBCUTANEOUS

## 2013-06-29 NOTE — Progress Notes (Signed)
*  PRELIMINARY RESULTS* Echocardiogram 2D Echocardiogram has been performed.  Joseph [name] 06/29/2013, 11:17 AM

## 2013-06-29 NOTE — Progress Notes (Signed)
Family Medicine Teaching Service Daily Progress Note Intern Pager: [phone]  Patient name: Joseph [name] Medical record number: [id number] Date of birth: [birth year] Age: 48 y.o. Gender: male  Primary Care Provider: No PCP Per Patient Consultants: cardiology  Code Status: full  Pt Overview and Major Events to Date:  4/27: Chest pain-cociane +; New onset HF, New daignosis DM A1c ~10  Assessment and Plan: Joseph [name] is a 48 y.o. male presenting with new onset CHF and possible COPD exacerbation vs CAP. PMH is significant for ~ 30 pack year smoking Hx, and no medical care in past 3 years.   # Chest Pain  - Substernal "chest pressure" last ~ 5 days associated with Dyspnea and orthopnea; No exertional component. Tachypnea and Tachycardia in ED with positive D-dimer, but CT Angio negative for PE. itrop negative in ED and EKG shows NSR w/ nonspecific Twave abnormalities (but incomplete upload in EPIC). CXR showed b/l pleural effusion; possible lower lobe PNA. BNP elevated. Afebrile, WBC wnl, Hgb wnl  - Most likely new onset HF with possible COPD vs CAP  - Oxygen to maintain saturations 88-92%; weaned down to 2L  - Strict I&Os (Net -3.7L) and daily weights (Down 4 lbs) (Currently 179; 183 admit) - Check BAC (Neg) and UDS (Cocaine +), D/c'd BB - Cycle troponin's Negative x 3  - Repeat EKG: Sinus Tach; LAD,LVH, L atrial enlargement   - ECHO ordered  - ASA, Morphine prn of pain  - Levaquin and Prednisone x 5 days (4/27>>)  - Duonebs q4hrs prn  - Lasix 40 mg PO qd - Risk Stat labs: TSH wnl, Lipids: (see below) , A1c (as below) - Cardiology consult today  # Tobacco Abuse, Possible COPD  - Report ~ 30 pack year smoking Hx; No diagnosis of COPD, but no medical care > 3 years  - Recommend PFTs as out patient once acute dyspnea resolves  - Duonebs as above; Hold off on Dulera until PFTs  - Nicotine patch   # DM - New onset - A1c: 10.3 - CBG q4hrs while NPO + SSI  # HLD -  Lipids: LDL 106,  Tchol 225 - CVrisk >10%; Start Lipitor  # Obese - Consider Out patient sleep study pending ECHO results  FEN/GI: NPO after midnight for cardiology evaluation; SLIV  Prophylaxis: heparin subq  Disposition: Discharge pending cardiac work-up  Subjective:  Denies CP, improved breathing. Admits to using Cocaine this past Saturday.   Objective: Temp:  [97.2 F (36.2 C)-98.5 F (36.9 C)] 97.7 F (36.5 C) (04/28 0500) Pulse Rate:  [94-126] 100 (04/28 0500) Resp:  [15-29] 19 (04/28 0500) BP: (109-171)/(52-105) 111/85 mmHg (04/28 0500) SpO2:  [90 %-98 %] 97 % (04/28 0500) Weight:  [179 lb 3.7 oz (81.3 kg)-183 lb 6.4 oz (83.19 kg)] 179 lb 3.7 oz (81.3 kg) (04/28 0500) Physical Exam: General: AA obese male; NAD  HEENT: MMM; PERRL; Pharynx clear  Cardiovascular: RRR no m/r/g; pulse 2+  Respiratory: B/l bibasilar rales improved from yesterday; normal WOB on 2L [place] O2; able to speak in full sentences  Abdomen: SNTND; BS +;  Extremities: WWP; No LE Edema  Skin: No rashes  Laboratory:  Recent Labs Lab 06/28/13 0949  WBC 8.9  HGB 14.1  HCT 41.2  PLT 200    Recent Labs Lab 06/28/13 0949  NA 138  K 4.4  CL 103  CO2 20  BUN 12  CREATININE 0.82  CALCIUM 8.4  PROT 6.6  BILITOT 0.7  ALKPHOS 58  ALT 33  AST 23  GLUCOSE 139*   BNP    Component Value Date/Time   PROBNP 955.8* 06/28/2013 0949     Recent Labs Lab 06/28/13 0949 06/28/13 2045 06/29/13 0145 06/29/13 0550  TROPONINI <0.30 <0.30 <0.30 <0.30   UDS: Cocaine +  Imaging/Diagnostic Tests: Ct Angio Chest W/cm &/or Wo Cm  06/28/2013 IMPRESSION: Small to moderate bilateral pleural effusions. Cardiomegaly. Bilateral lower lobe airspace opacities along with diffuse interstitial thickening. I favor this represents Edema/CHF. Bilateral lower lobe pneumonia cannot be completely excluded but felt less likely. No evidence of pulmonary embolus. Borderline and mildly enlarged mesenteric lymph nodes, presumably reactive.  These could be followed with repeat CT in 3-6 months.   Dg Chest Portable 1 View  06/28/2013 IMPRESSION: Mild enlargement of cardiac silhouette. Suspected mild pulmonary edema though infection not entirely excluded.   Phill Myron, MD 06/29/2013, 7:09 AM PGY-1, [place] Intern pager: [phone], text pages welcome

## 2013-06-29 NOTE — Progress Notes (Signed)
Nutrition Education Note  RD consulted for nutrition education regarding a Heart Healthy/Low Sodium diet.    Lipid Panel     Component Value Date/Time   CHOL 225* 06/28/2013 1701   TRIG 52 06/28/2013 1701   HDL 109 06/28/2013 1701   CHOLHDL 2.1 06/28/2013 1701   VLDL 10 06/28/2013 1701   LDLCALC 106* 06/28/2013 1701    RD provided "Heart Healthy Nutrition Therapy" handout from the Academy of [employer] and Dietetics. Reviewed patient's dietary recall. Provided examples on ways to decrease sodium and fat intake in diet. Discouraged intake of processed foods and use of salt shaker. Encouraged fresh fruits and vegetables as well as whole grain sources of carbohydrates to maximize fiber intake. Teach back method used. Pt states he will stop adding salt to food, decrease his intake of fried foods, and stop eating macaroni and cheese. He also reports eating whole wheat bread and states he will start eating brown rice instead of white rice. He was drinking Gatorade throughout the day PTA and states he will drink water instead.  Discussed ways to better control blood sugar. Encouraged pt to eat 3 meals daily instead of just one. Encouraged increased intake of vegetables and lean protein. Encouraged whole grains. Discussed the importance of avoiding sugar sweetened beverages and other sweets.   Expect good compliance.  Body mass index is 29.83 kg/(m^2). Pt meets criteria for Overweight based on current BMI.  Current diet order is NPO. Pt reports having a good appetite and eating well PTA. Labs and medications reviewed. No further nutrition interventions warranted at this time. RD contact information provided. If additional nutrition issues arise, please re-consult RD.  Pryor Ochoa RD, LDN Inpatient Clinical Dietitian Pager: [phone] After Hours Pager: [phone]

## 2013-06-29 NOTE — Progress Notes (Signed)
Report given to receiving RN. Patient in bed resting watching TV. No verbal complaints and no signs or symptoms of distress or discomfort. Family member at bed side.

## 2013-06-29 NOTE — Discharge Summary (Signed)
Family Medicine Teaching Service Hospital Discharge Summary  Patient name: Joseph [name] Medical record number: [id number] Date of birth: 09/27/1965 Age: 47 y.o. Gender: male Date of Admission: 06/28/2013  Date of Discharge: 07/01/13 Admitting Physician: Josalyn C Funches, MD  Primary Care Provider: No PCP Per Patient Consultants: Cardiology  Indication for Hospitalization: New Heart Failure  Discharge Diagnoses/Problem List:  HFrEF (EF 15% by CATH); Nonischemic, Cocaine induced New diagnosed DM2 (A1c 10) Tobacco Abuse HLD  Disposition: home  Discharge Condition: stable  Discharge Exam:  Filed Vitals:   07/01/13 0503 07/01/13 0607 07/01/13 0815 07/01/13 0817  BP: 92/67 98/70 95/66   Pulse: 92     Temp: 97.9 F (36.6 C)     TempSrc: Oral     Resp: 18  16   Height:      Weight: 175 lb 8 oz (79.606 kg)     SpO2: 97%  99% 95%   Physical Exam:  General: AA obese male; NAD  Cardiovascular: RRR no m/r/g; pulse 2+  Respiratory: B/l mild bibasilar rales; normal WOB on RA Abdomen: SNTND; BS + Extremities: WWP; No LE Edema  Skin: No rashes  Brief Hospital Course:  Joseph [name] is a 47 y.o. male presenting with new onset cocaine induced HFrEF (EF 15%)  PMH is significant for ~ 30 pack year smoking Hx, and no medical care in past 3 years. Cardiology was consulted and CATH performed that did not show ischemic etiology. Medical management of HFrEF was started. He was also diagnosed with DM2 and started on Metformin, SSI and Lipitor. He was discharged home to follow up with his New PCP and the Heart Failure team.    HFrEF  CATH: EF ~ 15%, LV dysfunction compatible with a nonischemic cardiomyopathy   Recommend medical therapy for a nonischemic cardiomyopathy and major lifestyle modification changes with reference to his alcohol, tobacco, and cocaine use.  Needs reassessment of LV function in several months  Will require lifevest in the interim and if LV function does not improve  ICD implantation.Representative contacted and paperwork and faxed in paperwork.   Metoprolol tartrate 12.5 BID (started 4/29); Consider adding ACE-I if BP tolerates   Lasix 40 mg PO qd, ASA 81qd  DM - New onset; A1c = 10.3  - Metformin (started 4/28). Held 4/30 post CATH. Restart 07/02/13 at 500 mg BID - Insulin SS while inpatient. Not prescribe on discharge. Will work on diet and titrate Metformin. Continue to monitor and escalate treatment as needed.   Tobacco Abuse, Possible COPD  - Reports ~ 30 pack year smoking Hx; No diagnosis of COPD, but no medical care > 3 years  - Recommend PFTs as out patient once acute dyspnea resolves  - Hold off on Dulera until PFTs  - Oxygen requirement resolved by discharge  HLD  - Lipids: LDL 106, Tchol 225  - CVrisk >10%; Lipitor (started 4/28)   Possible OSA/OHS - pt & wife report snoring  - Reccomend out patient sleep study   Issues for Follow Up:   Maximize HFrEF medical management  Optimize DM medications   Recommend PFT once acute dyspnea resolves  Recommend Sleep Study  Significant Procedures: Cardiac CATH  Significant Labs and Imaging:   Recent Labs Lab 06/30/13 0354 06/30/13 1246 07/01/13 0629  WBC 14.4* 7.9 13.4*  HGB 15.6 15.5 15.4  HCT 45.3 46.0 45.6  PLT 198 193 186    Recent Labs Lab 06/28/13 0949 06/29/13 0900 06/30/13 0354 06/30/13 1246 07/01/13 0629  NA   138 138 139  --  139  K 4.4 4.0 4.1  --  4.1  CL 103 101 102  --  103  CO2 _0 --  22  GLUCOSE 139* 189* 202*  --  144*  BUN _1 --  22  CREATININE 0.82 0.98 1.03 1.12 1.02  CALCIUM 8.4 9.3 9.4  --  9.2  ALKPHOS 58  --   --   --   --   AST 23  --   --   --   --   ALT 33  --   --   --   --   ALBUMIN 3.4*  --   --   --   --       Recent Labs Lab 06/28/13 0949 06/28/13 2045 06/29/13 0145 06/29/13 0550  TROPONINI <0.30 <0.30 <0.30 <0.30   BNP    Component Value Date/Time   PROBNP 955.8* 06/28/2013 0949   UDS: Cocaine  +  Imaging/Diagnostic Tests:  ECHO 06/29/13  Study Conclusions - Left ventricle: The cavity size was normal. There was mild concentric hypertrophy. The estimated ejection fraction was 20%. Severe global hypokinesis. The study is not technically sufficient to allow evaluation of LV diastolic function. - Mitral valve: Mildly thickened leaflets . Mild regurgitation. - Left atrium: Moderately dilated (25 cm2). - Right atrium: The atrium was at the upper limits of normal in size. - Atrial septum: No defect or patent foramen ovale was identified. - Inferior vena cava: The vessel was normal in size; the respirophasic diameter changes were in the normal range (= 50%); findings are consistent with normal central venous pressure. - Pericardium, extracardiac: There was no pericardial effusion.  Ct Angio Chest W/cm &/or Wo Cm  06/28/2013 IMPRESSION: Small to moderate bilateral pleural effusions. Cardiomegaly. Bilateral lower lobe airspace opacities along with diffuse interstitial thickening. I favor this represents Edema/CHF. Bilateral lower lobe pneumonia cannot be completely excluded but felt less likely. No evidence of pulmonary embolus. Borderline and mildly enlarged mesenteric lymph nodes, presumably reactive. These could be followed with repeat CT in 3-6 months.   Dg Chest Portable 1 View  06/28/2013 IMPRESSION: Mild enlargement of cardiac silhouette. Suspected mild pulmonary edema though infection not entirely excluded.   Results/Tests Pending at Time of Discharge: none  Discharge Medications:    Medication List         aspirin 81 MG EC tablet  Take 1 tablet (81 mg total) by mouth daily.     atorvastatin 40 MG tablet  Commonly known as:  LIPITOR  Take 1 tablet (40 mg total) by mouth daily at 6 PM.     Blood Glucose Meter kit  Use as instructed. Also provide Meter strips and lancets. Testing 3 times daily. Fasting in morning, 2hrs after largest meal, and any time symptoms of low  blood sugar     furosemide 40 MG tablet  Commonly known as:  LASIX  Take 1 tablet (40 mg total) by mouth daily.     metFORMIN 500 MG tablet  Commonly known as:  GLUCOPHAGE  Take 1 tablet (500 mg total) by mouth 2 (two) times daily with a meal. Start taking on 07/02/13. Due to CATH (dye load)  procedure 4/28.     metoprolol tartrate 12.5 mg Tabs tablet  Commonly known as:  LOPRESSOR  Take 0.5 tablets (12.5 mg total) by mouth 2 (two) times daily.        Discharge Instructions: Please refer to Patient Instructions  section of EMR for full details.  Patient was counseled important signs and symptoms that should prompt return to medical care, changes in medications, dietary instructions, activity restrictions, and follow up appointments.   Follow-Up Appointments: Follow-up Information   Follow up with Phill Myron, MD On 07/07/2013. (Apt at 4pm to establish PCP and Hosptial f/u)    Specialty:  Family Medicine   Contact information:   [place] [address] [phone]       Follow up with [facility] On 07/07/2013. (Apt at 2:30)    Specialty:  Cardiology   Contact information:   [id number] [place] [address] [phone]     Phill Myron, MD 07/01/2013, 12:51 PM PGY-1, [facility]

## 2013-06-29 NOTE — Progress Notes (Signed)
Utilization Review Completed Nashalie Sallis J. Kyra Laffey, RN, BSN, NCM [phone]  

## 2013-06-29 NOTE — ED Provider Notes (Signed)
Medical screening examination/treatment/procedure(s) were conducted as a shared visit with non-physician practitioner(s) and myself.  I personally evaluated the patient during the encounter.   EKG Interpretation   Date/Time:  Monday June 28 2013 09:20:53 EDT Ventricular Rate:  117 PR Interval:  154 QRS Duration: 100 QT Interval:  350 QTC Calculation: 488 R Axis:   -51 Text Interpretation:  Sinus tachycardia Left atrial enlargement LAD,  consider left anterior fascicular block Left ventricular hypertrophy  Borderline prolonged QT interval No significant change was found Confirmed  by Wyvonnia Dusky  MD, [place] [phone]) on 06/28/2013 4:35:50 PM      Joseph [name] is a 48 y.o. male hx of HTN here with 3 days of central chest pressure and shortness of breath. Sent from urgent care. O2 was around 90% on RA on arrival. Tachycardic. + diffuse wheezing on exam. Mild accessory muscle use but not in severe distress to require intubation or bipap. Given nebs, IV solumedrol. D-dimer elevated and CT showed no PE but possible CHF vs pneumonia. Given IV lasix, abx. Will admit for CHF, pneumonia, hypoxia.    Wandra Arthurs, MD 06/29/13 [phone]

## 2013-06-29 NOTE — Progress Notes (Signed)
Family Practice Teaching Service Interval Progress Note  Reviewed patients labs since admission. Note troponins negative x3. UDS positive for cocaine. Given this positive cocaine result will d/c beta blocker at this time. Note he received one dose prior to this result. Will need to discuss this result with the patient in the morning.  Tommi Rumps, MD Family Medicine PGY-2 Service Pager [phone]

## 2013-06-29 NOTE — Consult Note (Addendum)
CARDIOLOGY CONSULT NOTE  Patient ID: Joseph [name], MRN: [id number], DOB/AGE: Jul 21, 1965 48 y.o. Admit date: 06/28/2013 Date of Consult: 06/29/2013  Primary Physician: No PCP Per Patient Primary Cardiologist: none Referring Physician: Dr Adrian Blackwater  Chief Complaint: Shortness of Breath Reason for Consultation: CHF  HPI: 48 year old gentleman admitted with shortness of breath. This occurred in the setting of tobacco use, alcohol, and cocaine. He has no past medical problems, but does not receive any regular medical care.  The patient reports several days of progressive dyspnea and orthopnea. He also notes PND. He did not have leg swelling, abdominal bloating, lightheadedness, or syncope. He complained of associated chest pressure, but this is all improved since he has been treated with IV diuretics.  The patient has had no fevers, chills, productive cough, myalgias or arthralgias. He denies any sick contacts.  Pertinent findings from his admission labs and imaging are as follows:  Elevated pro BNP 956  Chest x-ray with bilateral pleural effusions and cardiomegaly as well as bilateral lower lobe opacities suggestive of edema/CHF  Negative troponin values  Positive urine drug screen for cocaine  Past Medical History  Diagnosis Date  . Hypertension   . Shortness of breath       Surgical History:  Past Surgical History  Procedure Laterality Date  . Fracture surgery Right 1987    2 fingers cut off rt hand roofing surgery replaced them     Home Meds: Prior to Admission medications   Not on File    Inpatient Medications:  . aspirin  81 mg Oral Daily  . atorvastatin  40 mg Oral q1800  . furosemide  40 mg Oral Once  . furosemide  40 mg Oral Daily  . heparin  5,000 Units Subcutaneous 3 times per day  . insulin aspart  0-15 Units Subcutaneous 6 times per day  . levofloxacin  750 mg Oral Daily  . metFORMIN  500 mg Oral Q supper  . nicotine  14 mg Transdermal Daily  .  predniSONE  50 mg Oral Q breakfast      Allergies: No Known Allergies  History   Social History  . Marital Status: Legally Separated    Spouse Name: N/A    Number of Children: N/A  . Years of Education: N/A   Occupational History  . Not on file.   Social History Main Topics  . Smoking status: Current Every Day Smoker -- 1.00 packs/day for 20 years    Types: Cigarettes  . Smokeless tobacco: Not on file  . Alcohol Use: 17.4 oz/week    24 Cans of beer, 5 Shots of liquor per week  . Drug Use: No  . Sexual Activity: Yes   Other Topics Concern  . Not on file   Social History Narrative  . No narrative on file     Family History  Problem Relation Age of Onset  . Family history unknown: Yes   there is no family history of coronary artery disease, myocardial infarction, CABG, or congestive heart failure.  Review of Systems: General: negative for chills, fever, night sweats or weight changes.  ENT: negative for rhinorrhea or epistaxis Cardiovascular: negative for chest pain, shortness of breath, dyspnea on exertion, edema, orthopnea, palpitations, or paroxysmal nocturnal dyspnea Dermatological: negative for rash Respiratory: negative for cough or wheezing GI: negative for nausea, vomiting, diarrhea, bright red blood per rectum, melena, or hematemesis GU: no hematuria, urgency, or frequency Neurologic: negative for visual changes, syncope, headache, or dizziness Heme: no  easy bruising or bleeding Endo: negative for excessive thirst, thyroid disorder, or flushing Musculoskeletal: negative for joint pain or swelling, negative for myalgias  All other systems reviewed and are otherwise negative except as noted above.  Physical Exam: Blood pressure 93/65, pulse 102, temperature 97.5 F (36.4 C), temperature source Oral, resp. rate 18, height 5\' 5"  (1.651 m), weight 179 lb 3.7 oz (81.3 kg), SpO2 98.00%. General: Well developed, well nourished, alert and oriented, in no acute  distress. HEENT: Normocephalic, atraumatic, sclera non-icteric, no xanthomas, nares are without discharge.  Neck: Supple. Carotids 2+ without bruits. JVP normal Lungs: Clear bilaterally to auscultation without wheezes, rales, or rhonchi. Breathing is unlabored. Heart: RRR with normal S1 and S2. No murmurs. There is an S4 gallop present Abdomen: Soft, non-tender, non-distended with normoactive bowel sounds. No hepatomegaly. No rebound/guarding. No obvious abdominal masses. Back: No CVA tenderness Msk:  Strength and tone appear normal for age. Extremities: No clubbing, cyanosis, or edema.  Distal pedal pulses are 2+ and equal bilaterally. Neuro: CNII-XII intact, moves all extremities spontaneously. Strength intact and equal bilaterally Psych:  Responds to questions appropriately with a normal affect.    Labs:  Recent Labs  06/28/13 0949 06/28/13 2045 06/29/13 0145 06/29/13 0550  TROPONINI <0.30 <0.30 <0.30 <0.30   Lab Results  Component Value Date   WBC 13.0* 06/29/2013   HGB 15.1 06/29/2013   HCT 45.0 06/29/2013   MCV 101.4* 06/29/2013   PLT 209 06/29/2013    Recent Labs Lab 06/28/13 0949 06/29/13 0900  NA 138 138  K 4.4 4.0  CL 103 101  CO2 20 22  BUN 12 16  CREATININE 0.82 0.98  CALCIUM 8.4 9.3  PROT 6.6  --   BILITOT 0.7  --   ALKPHOS 58  --   ALT 33  --   AST 23  --   GLUCOSE 139* 189*   Lab Results  Component Value Date   CHOL 225* 06/28/2013   HDL 109 06/28/2013   LDLCALC 106* 06/28/2013   TRIG 52 06/28/2013   Lab Results  Component Value Date   DDIMER 0.61* 06/28/2013    Radiology/Studies:  Ct Angio Chest W/cm &/or Wo Cm  06/28/2013   CLINICAL DATA:  Chest pain, shortness of breath.  EXAM: CT ANGIOGRAPHY CHEST WITH CONTRAST  TECHNIQUE: Multidetector CT imaging of the chest was performed using the standard protocol during bolus administration of intravenous contrast. Multiplanar CT image reconstructions and MIPs were obtained to evaluate the vascular anatomy.   CONTRAST:  190mL OMNIPAQUE IOHEXOL 350 MG/ML SOLN  COMPARISON:  04/25/2008  FINDINGS: No filling defects in the pulmonary arteries to suggest pulmonary emboli. There are small to moderate bilateral pleural effusions. Bilateral lower lobe airspace opacities are noted. Mild interstitial prominence/ thickening. There is cardiomegaly. Scattered coronary artery calcifications. Aorta is normal caliber. No aneurysm or dissection.  Mildly prominent scattered mediastinal lymph nodes. Index right paratracheal lymph node has a short axis diameter of 14 mm. Other similarly sized mediastinal lymph nodes scattered throughout the mediastinum. These are presumably reactive/ related to congestion.  Chest wall soft tissues are unremarkable. Imaging into the upper abdomen shows no acute findings. No acute bony abnormality or focal bone lesion.  Review of the MIP images confirms the above findings.  IMPRESSION: Small to moderate bilateral pleural effusions.  Cardiomegaly.  Bilateral lower lobe airspace opacities along with diffuse interstitial thickening. I favor this represents Edema/CHF. Bilateral lower lobe pneumonia cannot be completely excluded but felt less likely.  No  evidence of pulmonary embolus.  Borderline and mildly enlarged mesenteric lymph nodes, presumably reactive. These could be followed with repeat CT in 3-6 months.   Electronically Signed   By: Rolm Baptise M.D.   On: 06/28/2013 13:59   Dg Chest Portable 1 View  06/28/2013   CLINICAL DATA:  Chest pain, shortness of breath, smoker  EXAM: PORTABLE CHEST - 1 VIEW  COMPARISON:  Portable exam 0942 hr compared to 04/25/2008  FINDINGS: Enlargement of cardiac silhouette.  Mediastinal contours and pulmonary vascularity normal.  Interstitial infiltrates at the mid to lower lungs question pulmonary edema.  No gross pleural effusion or pneumothorax.  Osseous structures unremarkable.  IMPRESSION: Mild enlargement of cardiac silhouette.  Suspected mild pulmonary edema though  infection not entirely excluded.   Electronically Signed   By: Lavonia Dana M.D.   On: 06/28/2013 09:52    EKG: Sinus tachycardia, left ventricular hypertrophy, leftward axis, nonspecific T wave abnormality.  Cardiac Studies: 2-D echocardiogram is pending  ASSESSMENT AND PLAN:  48 year old gentleman with acute congestive heart failure, likely diastolic but echocardiogram is currently pending. I suspect this patient has hypertensive heart disease. Interestingly his blood pressure is now in the low-normal range on no antihypertensive medicines. He has had a significant diuresis since hospitalization as he is nearly 5 L negative in fluid balance.  Recommend:  Alcohol, tobacco, cocaine cessation. Counseling was done and the patient understands the relationship between his polysubstance abuse and development of congestive heart failure  Agree with converting IV Lasix to a daily dose of oral furosemide 40 mg  Await 2-D echocardiogram  Avoid beta blocker because of cocaine  After load reducing agent only if BP will tolerate (current SBP less than 100 mmHg)  Low-sodium diet  Will follow-up after echo resulted  Signed, Sherren Mocha  06/29/2013, 1:46 PM  2D Echo reviewed: Pt has severe LV dysfunction with LVEF 20%. With new LV dysfunction and CHF, recommend cardiac cath to eval for CAD. Suspect NICM, but pt has diabetes, HTN, tobacco, and significant risk of CAD. Will arrange for tomorrow.  Sherren Mocha 06/29/2013 4:26 PM

## 2013-06-29 NOTE — Progress Notes (Signed)
Pt given education sheet with cath PCI and Congestive heart failure videos. Pt verbalized understanding on how to watch videos. Pt currently watching movie.

## 2013-06-29 NOTE — Progress Notes (Signed)
FMTS ATTENDING  NOTE Joseph Duquette,MD I  have seen and examined this patient, reviewed their chart. I have discussed this patient with the resident. I agree with the resident's findings, assessment and care plan.  48 Y/O M with no significant PMX with hx of chest pain and SOB which has since improved in the last few hrs, he denies any chest pain currently,no SOB,no cough. He denies previous episode of chest pain or heart problem. Other than use of cocaine,he denies any other possible trigger of his chest pain.  Filed Vitals:   06/29/13 0241 06/29/13 0500 06/29/13 1044 06/29/13 1326  BP: 116/86 111/85 114/86 93/65  Pulse: 94 100 102 102  Temp: 97.4 F (36.3 C) 97.7 F (36.5 C)  97.5 F (36.4 C)  TempSrc: Oral Oral  Oral  Resp: 19 19  18   Height:      Weight:  179 lb 3.7 oz (81.3 kg)    SpO2: 97% 97%  98%   Exam: Gen: Calm in bed, not in distress. HEENT: EOMI,PEELA, Resp: Air entry equal and clear B/L. CV: S1 S2 normal,no murmur. Abd: NT/ND/BS+ Ext: No edema.  A/P: Chest pain/SOB:  R/O ACS vs cocaine induced vs CHF.         CXR showed possible infiltrate. Pro-BNP elevated as well         Doing well on Levaquin. Monitor on telemetry(QT interval), may switch if QT prolongation.         So far troponin has been negative.         Cardiologist consulted, he might need to be stressed.         F/U ECHO report.         Monitor O2 requirement.        Newly diagnosed DM: with A1C of 10.       Currently on sliding scale.       Plan to start of long acting insulin if patient agrees otherwise Metformin and sulfonylurea will be a good option.       Monitor CBG and serum glucose.

## 2013-06-30 ENCOUNTER — Encounter (HOSPITAL_COMMUNITY)
Admission: EM | Disposition: A | Discharge status: Home / Self Care | Payer: Self-pay | Source: Home / Self Care | Attending: Family Medicine

## 2013-06-30 DIAGNOSIS — J90 Pleural effusion, not elsewhere classified: Secondary | ICD-10-CM

## 2013-06-30 DIAGNOSIS — I2510 Atherosclerotic heart disease of native coronary artery without angina pectoris: Secondary | ICD-10-CM

## 2013-06-30 HISTORY — PX: LEFT HEART CATHETERIZATION WITH CORONARY ANGIOGRAM: SHX5451

## 2013-06-30 LAB — CBC
HCT: 45.3 % (ref 39.0–52.0)
HCT: 46 % (ref 39.0–52.0)
HEMOGLOBIN: 15.5 g/dL (ref 13.0–17.0)
Hemoglobin: 15.6 g/dL (ref 13.0–17.0)
MCH: 34.9 pg — AB (ref 26.0–34.0)
MCH: 34.2 pg — ABNORMAL HIGH (ref 26.0–34.0)
MCHC: 34.4 g/dL (ref 30.0–36.0)
MCHC: 33.7 g/dL (ref 30.0–36.0)
MCV: 101.3 fL — AB (ref 78.0–100.0)
MCV: 101.5 fL — ABNORMAL HIGH (ref 78.0–100.0)
PLATELETS: 198 10*3/uL (ref 150–400)
Platelets: 193 10*3/uL (ref 150–400)
RBC: 4.47 MIL/uL (ref 4.22–5.81)
RBC: 4.53 MIL/uL (ref 4.22–5.81)
RDW: 14.1 % (ref 11.5–15.5)
RDW: 14 % (ref 11.5–15.5)
WBC: 14.4 10*3/uL — ABNORMAL HIGH (ref 4.0–10.5)
WBC: 7.9 10*3/uL (ref 4.0–10.5)

## 2013-06-30 LAB — GLUCOSE, CAPILLARY
GLUCOSE-CAPILLARY: 144 mg/dL — AB (ref 70–99)
GLUCOSE-CAPILLARY: 211 mg/dL — AB (ref 70–99)
GLUCOSE-CAPILLARY: 314 mg/dL — AB (ref 70–99)
Glucose-Capillary: 172 mg/dL — ABNORMAL HIGH (ref 70–99)
Glucose-Capillary: 140 mg/dL — ABNORMAL HIGH (ref 70–99)
Glucose-Capillary: 155 mg/dL — ABNORMAL HIGH (ref 70–99)

## 2013-06-30 LAB — BASIC METABOLIC PANEL
BUN: 21 mg/dL (ref 6–23)
CALCIUM: 9.4 mg/dL (ref 8.4–10.5)
CO2: 23 meq/L (ref 19–32)
Chloride: 102 mEq/L (ref 96–112)
Creatinine, Ser: 1.03 mg/dL (ref 0.50–1.35)
GFR calc Af Amer: >90 mL/min (ref >90)
GFR calc non Af Amer: 84 mL/min — ABNORMAL LOW (ref >90)
GLUCOSE: 202 mg/dL — AB (ref 70–99)
Potassium: 4.1 mEq/L (ref 3.7–5.3)
Sodium: 139 mEq/L (ref 137–147)

## 2013-06-30 LAB — POCT I-STAT 3, VENOUS BLOOD GAS (G3P V)
Acid-base deficit: 1 mmol/L (ref 0.0–2.0)
Bicarbonate: 24.6 mEq/L — ABNORMAL HIGH (ref 20.0–24.0)
O2 Saturation: 64 %
PCO2 VEN: 41.4 mmHg — AB (ref 45.0–50.0)
PH VEN: 7.381 — AB (ref 7.250–7.300)
PO2 VEN: 34 mmHg (ref 30.0–45.0)
TCO2: 26 mmol/L (ref 0–100)

## 2013-06-30 LAB — POCT I-STAT 3, ART BLOOD GAS (G3+)
BICARBONATE: 22.9 meq/L (ref 20.0–24.0)
O2 Saturation: 96 %
PCO2 ART: 32.6 mmHg — AB (ref 35.0–45.0)
TCO2: 24 mmol/L (ref 0–100)
pH, Arterial: 7.455 — ABNORMAL HIGH (ref 7.350–7.450)
pO2, Arterial: 74 mmHg — ABNORMAL LOW (ref 80.0–100.0)

## 2013-06-30 LAB — CREATININE, SERUM
CREATININE: 1.12 mg/dL (ref 0.50–1.35)
GFR, EST AFRICAN AMERICAN: 87 mL/min — AB (ref >90)
GFR, EST NON AFRICAN AMERICAN: 75 mL/min — AB (ref >90)

## 2013-06-30 SURGERY — LEFT HEART CATHETERIZATION WITH CORONARY ANGIOGRAM
Anesthesia: LOCAL

## 2013-06-30 MED ORDER — HEPARIN SODIUM (PORCINE) 5000 UNIT/ML IJ SOLN
5000.0000 [IU] | Freq: Three times a day (TID) | INTRAMUSCULAR | Status: DC
  Administered 2013-07-01 (×2): 5000 [IU] via SUBCUTANEOUS
  Filled 2013-06-30 (×3): qty 1

## 2013-06-30 MED ORDER — LIVING WELL WITH DIABETES BOOK
Freq: Once | Status: AC
  Administered 2013-06-30: 10:00:00
  Filled 2013-06-30: qty 1

## 2013-06-30 MED ORDER — SODIUM CHLORIDE 0.9 % IV SOLN
INTRAVENOUS | Status: DC
  Administered 2013-06-30: 75 mL/h via INTRAVENOUS

## 2013-06-30 MED ORDER — MIDAZOLAM HCL 2 MG/2ML IJ SOLN
INTRAMUSCULAR | Status: AC
  Filled 2013-06-30: qty 2

## 2013-06-30 MED ORDER — ASPIRIN EC 81 MG PO TBEC
81.0000 mg | DELAYED_RELEASE_TABLET | Freq: Every day | ORAL | Status: DC
  Administered 2013-07-01: 81 mg via ORAL
  Filled 2013-06-30: qty 1

## 2013-06-30 MED ORDER — NITROGLYCERIN 0.2 MG/ML ON CALL CATH LAB
INTRAVENOUS | Status: AC
  Filled 2013-06-30: qty 1

## 2013-06-30 MED ORDER — HEPARIN (PORCINE) IN NACL 2-0.9 UNIT/ML-% IJ SOLN
INTRAMUSCULAR | Status: AC
  Filled 2013-06-30: qty 1000

## 2013-06-30 MED ORDER — METOPROLOL TARTRATE 12.5 MG HALF TABLET
12.5000 mg | ORAL_TABLET | Freq: Two times a day (BID) | ORAL | Status: DC
  Administered 2013-06-30 – 2013-07-01 (×3): 12.5 mg via ORAL
  Filled 2013-06-30 (×4): qty 1

## 2013-06-30 MED ORDER — FENTANYL CITRATE 0.05 MG/ML IJ SOLN
INTRAMUSCULAR | Status: AC
  Filled 2013-06-30: qty 2

## 2013-06-30 MED ORDER — LIDOCAINE HCL (PF) 1 % IJ SOLN
INTRAMUSCULAR | Status: AC
  Filled 2013-06-30: qty 30

## 2013-06-30 NOTE — Progress Notes (Signed)
Cath Lab arrived and applied pressured dressing and also applied pressure to the incision site as well. He restarted his bed rest. Will continue to monitor patient and patient's dressing for any additional bleeding, swelling, tenderness, and pain.

## 2013-06-30 NOTE — Progress Notes (Signed)
FMTS ATTENDING  NOTE Joseph Kosmicki,MD I  have seen and examined this patient, reviewed their chart. I have discussed this patient with the resident. I agree with the resident's findings, assessment and care plan.  Patient feels better,denies SOB or chest pain,awaiting cardiac catheterization today. I discuss his new diagnosis of CHF with EF 20% with him, possible associated risk factors including cocaine use, tobacco use as well as alcoholism, I discussed importance of abstaining from all this. Treatment plan discussed with him. All questions were answered about his CHF and DM 2. He continues to be treated for PNA. Not officially diagnosed with COPD but with hx of smoking was treated as such,but at this time, I feel we can D/C his oral steroid since he responded very well to Lasix. We will f/u after catheterization procedure.

## 2013-06-30 NOTE — Progress Notes (Signed)
Patient return to unit from cath lab. Right groin is a level 0. No complaints of pain. No signs or symptoms of distress or discomfort. Will continue to monitor patient for further changes.

## 2013-06-30 NOTE — Progress Notes (Addendum)
Report given to receiving RN. Patient in bed resting watching TV, with family at the bedside. Right groin is a level 0. No complaints of pain. No signs or symptoms of distress or discomfort.

## 2013-06-30 NOTE — Progress Notes (Addendum)
Heart Failure Navigator Consult Note  Presentation: Joseph [name] is a 48 y.o. male presenting with new onset CHF and possible COPD exacerbation vs CAP. PMH is significant for ~ 30 pack year smoking Hx, and no medical care in past 3 years.    Past Medical History  Diagnosis Date  . Hypertension   . Shortness of breath   . High cholesterol   . CHF (congestive heart failure)   . Pneumonia ?2004  . Type II diabetes mellitus     "dx'd today" (06/29/2013)    History   Social History  . Marital Status: Legally Separated    Spouse Name: N/A    Number of Children: N/A  . Years of Education: N/A   Social History Main Topics  . Smoking status: Current Every Day Smoker -- 1.00 packs/day for 31 years    Types: Cigarettes  . Smokeless tobacco: Never Used  . Alcohol Use: 19.2 oz/week    21 Cans of beer, 11 Shots of liquor per week     Comment: 06/29/2013 "2-3 beers/day; maybe 1 pint/wk"  . Drug Use: Yes    Special: Cocaine     Comment: 06/29/2013 "last used cocaine ~ 06/25/2013; use it ~ once/month"  . Sexual Activity: Yes   Other Topics Concern  . None   Social History Narrative  . None    ECHO:Study Conclusions--06/29/13  - Left ventricle: The cavity size was normal. There was mild concentric hypertrophy. The estimated ejection fraction was 20%. Severe global hypokinesis. The study is not technically sufficient to allow evaluation of LV diastolic function. - Mitral valve: Mildly thickened leaflets . Mild regurgitation. - Left atrium: Moderately dilated (25 cm2). - Right atrium: The atrium was at the upper limits of normal in size. - Atrial septum: No defect or patent foramen ovale was identified. - Inferior vena cava: The vessel was normal in size; the respirophasic diameter changes were in the normal range (= 50%); findings are consistent with normal central venous pressure. - Pericardium, extracardiac: There was no pericardial effusion.  Cardiac  Catheterization--06/30/13 Severe LV dysfunction compatible with a nonischemic cardiomyopathy and an ejection fraction of less than or equal to 15%.   Mild coronary obstructive disease with 30% narrowing in the mid AV groove circumflex, and 30% proximal and 40% mid RCA stenoses with otherwise normal LAD and ramus intermediate vessel.  The patient will be started on medical therapy for a nonischemic cardiomyopathy. He will require major lifestyle modification changes with reference to his alcohol, tobacco, and cocaine use. Following medical therapy, reassessment of LV function will be necessary in several months. He may require lifevest in the interim and if LV function does not improve ICD implantation  BNP    Component Value Date/Time   PROBNP 955.8* 06/28/2013 0949    Education Assessment and Provision:  Detailed education and instructions provided on heart failure disease management including the following:  Signs and symptoms of Heart Failure When to call the physician Importance of daily weights Low sodium diet  Fluid restriction Medication management Anticipated future follow-up appointments  Patient education given on each of the above topics.  Patient acknowledges understanding and acceptance of all instructions.  I left written materials with Mr. Klabunde plan to return tomorrow to do teaching as he seemed a bit overwhelmed today and had cardiac cath earlier today.  He had reviewed the educational material I Ieft last pm.  We discussed all topics described above as well as how important lifestyle changes including stopping  cocaine and drinking will be for his health.   He seems to understand and seems motivated to make changes.  Dietician is also seeing him to review low sodium diet.  I will provide him with a scale to daily weighing at home.  Education Materials:  "Living Better With Heart Failure" Booklet, Daily Weight Tracker Tool and Heart Failure Educational Video.   High Risk  Criteria for Readmission and/or Poor Patient Outcomes:  (Recommend Follow-up with [facility])--Yes   EF <30%- Yes  2 or more admissions in 6 months- No  Difficult social situation- No  Demonstrates medication noncompliance- Yes   Barriers of Care:  New diagnosis of HF--Knowledge of medical condition, ability/ desire to be compliant, lifestyle modifications  Discharge Planning:   Patient has not had medical care in over 3 years.  He plans to discharge to home with girlfriend.  He will need quick outpatient follow-up and continued HF education as well as compliance reinforcement.   I have scheduled him an appt in the HF clinic.  He also has an appt in the family medicine clinic.

## 2013-06-30 NOTE — H&P (View-Only) (Signed)
Subjective:  No chest pain; breathing better  Objective:   Vital Signs in the last 24 hours: Temp:  [97.5 F (36.4 C)-98.2 F (36.8 C)] 98 F (36.7 C) (04/29 1761) Pulse Rate:  [102-103] 103 (04/29 0608) Resp:  [18] 18 (04/29 0608) BP: (93-114)/(65-86) 105/78 mmHg (04/29 0608) SpO2:  [97 %-98 %] 98 % (04/29 0608) Weight:  [174 lb 9.6 oz (79.198 kg)] 174 lb 9.6 oz (79.198 kg) (04/29 6073)  Intake/Output from previous day: 04/28 0701 - 04/29 0700 In: 240 [P.O.:240] Out: 1950 [Urine:1950]  Medications: . aspirin  81 mg Oral Daily  . atorvastatin  40 mg Oral q1800  . diazepam  5 mg Oral On Call  . furosemide  40 mg Oral Once  . furosemide  40 mg Oral Daily  . heparin  5,000 Units Subcutaneous 3 times per day  . insulin aspart  0-15 Units Subcutaneous TID WC  . insulin aspart  0-5 Units Subcutaneous QHS  . insulin aspart  4 Units Subcutaneous TID WC  . metFORMIN  500 mg Oral Q supper  . nicotine  14 mg Transdermal Daily  . pneumococcal 23 valent vaccine  0.5 mL Intramuscular Tomorrow-1000  . predniSONE  50 mg Oral Q breakfast  . sodium chloride  3 mL Intravenous Q12H    . sodium chloride 10 mL/hr at 06/30/13 0609    Physical Exam:   General appearance: alert, cooperative and no distress Neck: no carotid bruit, no JVD, supple, symmetrical, trachea midline and thyroid not enlarged, symmetric, no tenderness/mass/nodules Lungs: clear to auscultation bilaterally; sligtly decreased at bases Heart: tachycardic, regular, s1, s2, S4 present and 1/6 sem; no rub Abdomen: soft, non-tender; bowel sounds normal; no masses,  no organomegaly Extremities: extremities normal, atraumatic, no cyanosis or edema Pulses: 2+ and symmetric Skin: Skin color, texture, turgor normal. No rashes or lesions Neurologic: Grossly normal   Rate: 102  Rhythm: sinus tachycardia  Lab Results:   Recent Labs  06/29/13 0900 06/30/13 0354  NA 138 139  K 4.0 4.1  CL 101 102  CO2 22 23  GLUCOSE  189* 202*  BUN 16 21  CREATININE 0.98 1.03    Recent Labs  06/29/13 0145 06/29/13 0550  TROPONINI <0.30 <0.30   CBC    Component Value Date/Time   WBC 14.4* 06/30/2013 0354   RBC 4.47 06/30/2013 0354   HGB 15.6 06/30/2013 0354   HCT 45.3 06/30/2013 0354   PLT 198 06/30/2013 0354   MCV 101.3* 06/30/2013 0354   MCH 34.9* 06/30/2013 0354   MCHC 34.4 06/30/2013 0354   RDW 14.1 06/30/2013 0354   LYMPHSABS 2.4 06/28/2013 0949   MONOABS 0.7 06/28/2013 0949   EOSABS 0.2 06/28/2013 0949   BASOSABS 0.0 06/28/2013 0949    Hepatic Function Panel  Recent Labs  06/28/13 0949  PROT 6.6  ALBUMIN 3.4*  AST 23  ALT 33  ALKPHOS 58  BILITOT 0.7    Recent Labs  06/29/13 2032  INR 0.97   BNP (last 3 results)  Recent Labs  06/28/13 0949  PROBNP 955.8*    Lipid Panel     Component Value Date/Time   CHOL 225* 06/28/2013 1701   TRIG 52 06/28/2013 1701   HDL 109 06/28/2013 1701   CHOLHDL 2.1 06/28/2013 1701   VLDL 10 06/28/2013 1701   LDLCALC 106* 06/28/2013 1701      Imaging:  Ct Angio Chest W/cm &/or Wo Cm  06/28/2013   CLINICAL DATA:  Chest pain, shortness of  breath.  EXAM: CT ANGIOGRAPHY CHEST WITH CONTRAST  TECHNIQUE: Multidetector CT imaging of the chest was performed using the standard protocol during bolus administration of intravenous contrast. Multiplanar CT image reconstructions and MIPs were obtained to evaluate the vascular anatomy.  CONTRAST:  156mL OMNIPAQUE IOHEXOL 350 MG/ML SOLN  COMPARISON:  04/25/2008  FINDINGS: No filling defects in the pulmonary arteries to suggest pulmonary emboli. There are small to moderate bilateral pleural effusions. Bilateral lower lobe airspace opacities are noted. Mild interstitial prominence/ thickening. There is cardiomegaly. Scattered coronary artery calcifications. Aorta is normal caliber. No aneurysm or dissection.  Mildly prominent scattered mediastinal lymph nodes. Index right paratracheal lymph node has a short axis diameter of 14 mm.  Other similarly sized mediastinal lymph nodes scattered throughout the mediastinum. These are presumably reactive/ related to congestion.  Chest wall soft tissues are unremarkable. Imaging into the upper abdomen shows no acute findings. No acute bony abnormality or focal bone lesion.  Review of the MIP images confirms the above findings.  IMPRESSION: Small to moderate bilateral pleural effusions.  Cardiomegaly.  Bilateral lower lobe airspace opacities along with diffuse interstitial thickening. I favor this represents Edema/CHF. Bilateral lower lobe pneumonia cannot be completely excluded but felt less likely.  No evidence of pulmonary embolus.  Borderline and mildly enlarged mesenteric lymph nodes, presumably reactive. These could be followed with repeat CT in 3-6 months.   Electronically Signed   By: Rolm Baptise M.D.   On: 06/28/2013 13:59   Dg Chest Portable 1 View  06/28/2013   CLINICAL DATA:  Chest pain, shortness of breath, smoker  EXAM: PORTABLE CHEST - 1 VIEW  COMPARISON:  Portable exam 0942 hr compared to 04/25/2008  FINDINGS: Enlargement of cardiac silhouette.  Mediastinal contours and pulmonary vascularity normal.  Interstitial infiltrates at the mid to lower lungs question pulmonary edema.  No gross pleural effusion or pneumothorax.  Osseous structures unremarkable.  IMPRESSION: Mild enlargement of cardiac silhouette.  Suspected mild pulmonary edema though infection not entirely excluded.   Electronically Signed   By: Lavonia Dana M.D.   On: 06/28/2013 09:52    Echo Study Conclusions  - Left ventricle: The cavity size was normal. There was mild concentric hypertrophy. The estimated ejection fraction was 20%. Severe global hypokinesis. The study is not technically sufficient to allow evaluation of LV diastolic function. - Mitral valve: Mildly thickened leaflets . Mild regurgitation. - Left atrium: Moderately dilated (25 cm2). - Right atrium: The atrium was at the upper limits of normal in  size. - Atrial septum: No defect or patent foramen ovale was identified. - Inferior vena cava: The vessel was normal in size; the respirophasic diameter changes were in the normal range (= 50%); findings are consistent with normal central venous pressure. - Pericardium, extracardiac: There was no pericardial effusion.    Assessment/Plan:   Active Problems:   CHF (congestive heart failure)   Chest pain   Pleural effusion   Labs and echo reviewed. Pt breathing well. EF 20% with severe global hypokinesis. Discussed cath with patient. He drinks beer daily, occ cocaine use, last on Saturday and states he uses monthly. With markedly decreased EF will plan R and L heart cath today. Pt agrees to pursue. Increased WBC and glucose ? Steroid related. Inc MCV, check B12, folate.   Troy Sine, MD, [facility] 06/30/2013, 8:38 AM

## 2013-06-30 NOTE — Progress Notes (Signed)
Subjective:  No chest pain; breathing better  Objective:   Vital Signs in the last 24 hours: Temp:  [97.5 F (36.4 C)-98.2 F (36.8 C)] 98 F (36.7 C) (04/29 5329) Pulse Rate:  [102-103] 103 (04/29 0608) Resp:  [18] 18 (04/29 0608) BP: (93-114)/(65-86) 105/78 mmHg (04/29 0608) SpO2:  [97 %-98 %] 98 % (04/29 0608) Weight:  [174 lb 9.6 oz (79.198 kg)] 174 lb 9.6 oz (79.198 kg) (04/29 9242)  Intake/Output from previous day: 04/28 0701 - 04/29 0700 In: 240 [P.O.:240] Out: 1950 [Urine:1950]  Medications: . aspirin  81 mg Oral Daily  . atorvastatin  40 mg Oral q1800  . diazepam  5 mg Oral On Call  . furosemide  40 mg Oral Once  . furosemide  40 mg Oral Daily  . heparin  5,000 Units Subcutaneous 3 times per day  . insulin aspart  0-15 Units Subcutaneous TID WC  . insulin aspart  0-5 Units Subcutaneous QHS  . insulin aspart  4 Units Subcutaneous TID WC  . metFORMIN  500 mg Oral Q supper  . nicotine  14 mg Transdermal Daily  . pneumococcal 23 valent vaccine  0.5 mL Intramuscular Tomorrow-1000  . predniSONE  50 mg Oral Q breakfast  . sodium chloride  3 mL Intravenous Q12H    . sodium chloride 10 mL/hr at 06/30/13 0609    Physical Exam:   General appearance: alert, cooperative and no distress Neck: no carotid bruit, no JVD, supple, symmetrical, trachea midline and thyroid not enlarged, symmetric, no tenderness/mass/nodules Lungs: clear to auscultation bilaterally; sligtly decreased at bases Heart: tachycardic, regular, s1, s2, S4 present and 1/6 sem; no rub Abdomen: soft, non-tender; bowel sounds normal; no masses,  no organomegaly Extremities: extremities normal, atraumatic, no cyanosis or edema Pulses: 2+ and symmetric Skin: Skin color, texture, turgor normal. No rashes or lesions Neurologic: Grossly normal   Rate: 102  Rhythm: sinus tachycardia  Lab Results:   Recent Labs  06/29/13 0900 06/30/13 0354  NA 138 139  K 4.0 4.1  CL 101 102  CO2 22 23  GLUCOSE  189* 202*  BUN 16 21  CREATININE 0.98 1.03    Recent Labs  06/29/13 0145 06/29/13 0550  TROPONINI <0.30 <0.30   CBC    Component Value Date/Time   WBC 14.4* 06/30/2013 0354   RBC 4.47 06/30/2013 0354   HGB 15.6 06/30/2013 0354   HCT 45.3 06/30/2013 0354   PLT 198 06/30/2013 0354   MCV 101.3* 06/30/2013 0354   MCH 34.9* 06/30/2013 0354   MCHC 34.4 06/30/2013 0354   RDW 14.1 06/30/2013 0354   LYMPHSABS 2.4 06/28/2013 0949   MONOABS 0.7 06/28/2013 0949   EOSABS 0.2 06/28/2013 0949   BASOSABS 0.0 06/28/2013 0949    Hepatic Function Panel  Recent Labs  06/28/13 0949  PROT 6.6  ALBUMIN 3.4*  AST 23  ALT 33  ALKPHOS 58  BILITOT 0.7    Recent Labs  06/29/13 2032  INR 0.97   BNP (last 3 results)  Recent Labs  06/28/13 0949  PROBNP 955.8*    Lipid Panel     Component Value Date/Time   CHOL 225* 06/28/2013 1701   TRIG 52 06/28/2013 1701   HDL 109 06/28/2013 1701   CHOLHDL 2.1 06/28/2013 1701   VLDL 10 06/28/2013 1701   LDLCALC 106* 06/28/2013 1701      Imaging:  Ct Angio Chest W/cm &/or Wo Cm  06/28/2013   CLINICAL DATA:  Chest pain, shortness of  breath.  EXAM: CT ANGIOGRAPHY CHEST WITH CONTRAST  TECHNIQUE: Multidetector CT imaging of the chest was performed using the standard protocol during bolus administration of intravenous contrast. Multiplanar CT image reconstructions and MIPs were obtained to evaluate the vascular anatomy.  CONTRAST:  156mL OMNIPAQUE IOHEXOL 350 MG/ML SOLN  COMPARISON:  04/25/2008  FINDINGS: No filling defects in the pulmonary arteries to suggest pulmonary emboli. There are small to moderate bilateral pleural effusions. Bilateral lower lobe airspace opacities are noted. Mild interstitial prominence/ thickening. There is cardiomegaly. Scattered coronary artery calcifications. Aorta is normal caliber. No aneurysm or dissection.  Mildly prominent scattered mediastinal lymph nodes. Index right paratracheal lymph node has a short axis diameter of 14 mm.  Other similarly sized mediastinal lymph nodes scattered throughout the mediastinum. These are presumably reactive/ related to congestion.  Chest wall soft tissues are unremarkable. Imaging into the upper abdomen shows no acute findings. No acute bony abnormality or focal bone lesion.  Review of the MIP images confirms the above findings.  IMPRESSION: Small to moderate bilateral pleural effusions.  Cardiomegaly.  Bilateral lower lobe airspace opacities along with diffuse interstitial thickening. I favor this represents Edema/CHF. Bilateral lower lobe pneumonia cannot be completely excluded but felt less likely.  No evidence of pulmonary embolus.  Borderline and mildly enlarged mesenteric lymph nodes, presumably reactive. These could be followed with repeat CT in 3-6 months.   Electronically Signed   By: Rolm Baptise M.D.   On: 06/28/2013 13:59   Dg Chest Portable 1 View  06/28/2013   CLINICAL DATA:  Chest pain, shortness of breath, smoker  EXAM: PORTABLE CHEST - 1 VIEW  COMPARISON:  Portable exam 0942 hr compared to 04/25/2008  FINDINGS: Enlargement of cardiac silhouette.  Mediastinal contours and pulmonary vascularity normal.  Interstitial infiltrates at the mid to lower lungs question pulmonary edema.  No gross pleural effusion or pneumothorax.  Osseous structures unremarkable.  IMPRESSION: Mild enlargement of cardiac silhouette.  Suspected mild pulmonary edema though infection not entirely excluded.   Electronically Signed   By: Lavonia Dana M.D.   On: 06/28/2013 09:52    Echo Study Conclusions  - Left ventricle: The cavity size was normal. There was mild concentric hypertrophy. The estimated ejection fraction was 20%. Severe global hypokinesis. The study is not technically sufficient to allow evaluation of LV diastolic function. - Mitral valve: Mildly thickened leaflets . Mild regurgitation. - Left atrium: Moderately dilated (25 cm2). - Right atrium: The atrium was at the upper limits of normal in  size. - Atrial septum: No defect or patent foramen ovale was identified. - Inferior vena cava: The vessel was normal in size; the respirophasic diameter changes were in the normal range (= 50%); findings are consistent with normal central venous pressure. - Pericardium, extracardiac: There was no pericardial effusion.    Assessment/Plan:   Active Problems:   CHF (congestive heart failure)   Chest pain   Pleural effusion   Labs and echo reviewed. Pt breathing well. EF 20% with severe global hypokinesis. Discussed cath with patient. He drinks beer daily, occ cocaine use, last on Saturday and states he uses monthly. With markedly decreased EF will plan R and L heart cath today. Pt agrees to pursue. Increased WBC and glucose ? Steroid related. Inc MCV, check B12, folate.   Troy Sine, MD, [facility] 06/30/2013, 8:38 AM

## 2013-06-30 NOTE — Interval H&P Note (Signed)
Cath Lab Visit (complete for each Cath Lab visit)  Clinical Evaluation Leading to the Procedure:   ACS: no  Non-ACS:    Anginal Classification: CCS III  Anti-ischemic medical therapy: Minimal Therapy (1 class of medications)  Non-Invasive Test Results: No non-invasive testing performed  Prior CABG: No previous CABG      History and Physical Interval Note:  06/30/2013 10:07 AM  Joseph [name]  has presented today for surgery, with the diagnosis of cp  The various methods of treatment have been discussed with the patient and family. After consideration of risks, benefits and other options for treatment, the patient has consented to  Procedure(s): LEFT HEART CATHETERIZATION WITH CORONARY ANGIOGRAM (N/A) as a surgical intervention .  The patient's history has been reviewed, patient examined, no change in status, stable for surgery.  I have reviewed the patient's chart and labs.  Questions were answered to the patient's satisfaction.     Troy Sine

## 2013-06-30 NOTE — Progress Notes (Signed)
Family Medicine Teaching Service Daily Progress Note Intern Pager: [phone]  Patient name: Joseph [name] Medical record number: [id number] Date of birth: 09/10/1965 Age: 48 y.o. Gender: male  Primary Care Provider: No PCP Per Patient Consultants: cardiology  Code Status: full  Pt Overview and Major Events to Date:  4/27: Chest pain-cociane +; New onset HF, New daignosis DM A1c ~10 4/28: ECHO EF ~ 20%; Started Lipitor, Metformin, SSI 4/29: CATH Lab; tolerating meds well  Assessment and Plan: JHAMIR PICKUP is a 48 y.o. male presenting with new onset CHF and possible COPD exacerbation vs CAP. PMH is significant for ~ 30 pack year smoking Hx, and no medical care in past 3 years.   # Chest Pain  - Substernal "chest pressure" last ~ 5 days associated with Dyspnea and orthopnea; No exertional component. Tachypnea and Tachycardia in ED with positive D-dimer, but CT Angio negative for PE. itrop negative in ED and EKG shows NSR w/ nonspecific Twave abnormalities (but incomplete upload in EPIC). CXR showed b/l pleural effusion; possible lower lobe PNA. BNP elevated. Afebrile, WBC wnl, Hgb wnl  - Most likely new onset HF with possible COPD vs CAP  - Oxygen to maintain saturations 88-92%; weaned down to 2L  - Strict I&Os (Net -5.5L) and daily weights (Down 9 lbs) (Currently 174; 183 admit) - BAC (Neg) and UDS (Cocaine +), D/c'd BB 4/28 - Cycle troponin's Negative x 3  - Repeat EKG: Sinus Tach; LAD,LVH, L atrial enlargement   - ECHO: EF 20% Severe global hypokinesis; LAD - ASA, - D/c'd Levaquin and Prednisone(4/27>>4/29); as hypoxia/symptoms most likely HFrEF related  - Duonebs q4hrs prn  - Lasix 40 mg PO qd - Risk Stat labs: TSH wnl, Lipids: (see below) , A1c (as below) - Cardiology consulted  CATH: EF ~ 15%, LV dysfunction compatible with a nonischemic cardiomyopathy   Rec medical therapy for a nonischemic cardiomyopathy. He will require major lifestyle modification changes with reference to  his alcohol, tobacco, and cocaine use.   Following medical therapy, reassessment of LV function will be necessary in several months.  He may require lifevest in the interim and if LV function does not improve ICD implantation. - Metoprolol tartrate 12.5 BID (started 4/29); Consider adding ACE-I if BP tolerates    # Tobacco Abuse, Possible COPD  - Report ~ 30 pack year smoking Hx; No diagnosis of COPD, but no medical care > 3 years  - Recommend PFTs as out patient once acute dyspnea resolves  - Duonebs as above; Hold off on Dulera until PFTs  - Nicotine patch   # DM - New onset - A1c: 10.3 - CBG AC/HS + SSI, Metformin (started 4/28)  # HLD -  Lipids: LDL 106, Tchol 225 - CVrisk >10%; Lipitor (started 4/28)  # Possible OSA/OHS - pt & wife report snoring  - Reccomend out patient sleep study pending  FEN/GI: NPO after midnight for cardiology CATH; SLIV  Prophylaxis: heparin subq  Disposition: Discharge pending cardiac work-up  Subjective:  Denies current CP of SOB on 2L [place]. No palpitations   Objective: Temp:  [97.5 F (36.4 C)-98.2 F (36.8 C)] 98 F (36.7 C) (04/29 2951) Pulse Rate:  [102-103] 103 (04/29 0608) Resp:  [18] 18 (04/29 8841) BP: (93-114)/(65-86) 105/78 mmHg (04/29 0608) SpO2:  [97 %-98 %] 98 % (04/29 0608) Weight:  [174 lb 9.6 oz (79.198 kg)] 174 lb 9.6 oz (79.198 kg) (04/29 6606) Physical Exam: General: AA obese male; NAD  Cardiovascular: RRR no m/r/g;  pulse 2+  Respiratory: B/l mild bibasilar rales; normal WOB on 2L [place] O2 Abdomen: SNTND; BS +;  Extremities: WWP; No LE Edema  Skin: No rashes  Laboratory:  Recent Labs Lab 06/28/13 0949 06/29/13 0900 06/30/13 0354  WBC 8.9 13.0* 14.4*  HGB 14.1 15.1 15.6  HCT 41.2 45.0 45.3  PLT 200 209 198    Recent Labs Lab 06/28/13 0949 06/29/13 0900 06/30/13 0354  NA 138 138 139  K 4.4 4.0 4.1  CL 103 101 102  CO2 20 22 23   BUN 12 16 21   CREATININE 0.82 0.98 1.03  CALCIUM 8.4 9.3 9.4  PROT 6.6   --   --   BILITOT 0.7  --   --   ALKPHOS 58  --   --   ALT 33  --   --   AST 23  --   --   GLUCOSE 139* 189* 202*   BNP    Component Value Date/Time   PROBNP 955.8* 06/28/2013 0949      Recent Labs Lab 06/28/13 0949 06/28/13 2045 06/29/13 0145 06/29/13 0550  TROPONINI <0.30 <0.30 <0.30 <0.30   UDS: Cocaine +  Imaging/Diagnostic Tests:  ECHO 06/29/13 Study Conclusions  - Left ventricle: The cavity size was normal. There was mild concentric hypertrophy. The estimated ejection fraction was 20%. Severe global hypokinesis. The study is not technically sufficient to allow evaluation of LV diastolic function. - Mitral valve: Mildly thickened leaflets . Mild regurgitation. - Left atrium: Moderately dilated (25 cm2). - Right atrium: The atrium was at the upper limits of normal in size. - Atrial septum: No defect or patent foramen ovale was identified. - Inferior vena cava: The vessel was normal in size; the respirophasic diameter changes were in the normal range (= 50%); findings are consistent with normal central venous pressure. - Pericardium, extracardiac: There was no pericardial effusion.  Ct Angio Chest W/cm &/or Wo Cm  06/28/2013 IMPRESSION: Small to moderate bilateral pleural effusions. Cardiomegaly. Bilateral lower lobe airspace opacities along with diffuse interstitial thickening. I favor this represents Edema/CHF. Bilateral lower lobe pneumonia cannot be completely excluded but felt less likely. No evidence of pulmonary embolus. Borderline and mildly enlarged mesenteric lymph nodes, presumably reactive. These could be followed with repeat CT in 3-6 months.   Dg Chest Portable 1 View  06/28/2013 IMPRESSION: Mild enlargement of cardiac silhouette. Suspected mild pulmonary edema though infection not entirely excluded.   Phill Myron, MD 06/30/2013, 8:42 AM PGY-1, [employer] Intern pager: [phone], text pages welcome

## 2013-06-30 NOTE — Progress Notes (Signed)
Inpatient Diabetes Program Recommendations  AACE/ADA: New Consensus Statement on Inpatient Glycemic Control (2013)  Target Ranges:  Prepandial:   less than 140 mg/dL      Peak postprandial:   less than 180 mg/dL (1-2 hours)      Critically ill patients:  140 - 180 mg/dL   Reason for Visit: New Onset diabetes. Results for Joseph [name], Joseph [name] (MRN [id number]) as of 06/30/2013 09:35  Ref. Range 06/28/2013 17:01  Hemoglobin A1C Latest Range: <5.7 % 10.3 (H)  Results for OKEY, ZELEK (MRN [id number]) as of 06/30/2013 09:35  Ref. Range 06/29/2013 07:51 06/29/2013 10:59 06/29/2013 16:43 06/29/2013 21:32 06/30/2013 05:56  Glucose-Capillary Latest Range: 70-99 mg/dL 190 (H) 139 (H) 342 (H) 90 172 (H)   Diabetes history: Per Health History, patient with New Onset Diabetes Outpatient Diabetes medications: None Current orders for Inpatient glycemic control: Novolog moderate tid with meals and HS, Novolog 4 units tid with meals, Metformin 500 mg q PM.   Will order basic survival skill education by bedside RN.  Will also speak with patient.   Adah Perl, RN, BC-ADM Inpatient Diabetes Coordinator Pager [phone]:  Spoke with patient regarding new diagnosis of diabetes.  He states that his mother, sister and girlfriend have diabetes.  Discussed glucose meter/monitoring and explained that he can get generic glucose meter from [facility] or any other drug store.  He verbalized understanding.  Also discussed survival skills including S&S of low and high CBG's, normal glucose levels, foot care, complications, and basic information regarding carbohydrates.  He states that he does drink a lot of Gatorade and sodas when he is working.  Discussed the potential of cutting out sugar from soda and beverages.  He states that he thinks that he can do that.  Also discussed the importance of follow-up with PCP for diabetes management including standards of care.  Will follow and be glad to answer questions or follow-up  as needed.    Adah Perl, RN, BC-ADM Inpatient Diabetes Coordinator Pager [phone]

## 2013-06-30 NOTE — Progress Notes (Signed)
Patient stated that he had to cough. Had only been on the unit from cath lab for 30 minutes. His dressing was saturated with blood. Cath lab was notified and pressure was applied to the area of the incision until until the area could be further assessed. No hematoma noted. BP is 116/91 and HR is 95.

## 2013-06-30 NOTE — CV Procedure (Signed)
Joseph [name] is a 48 y.o. male   [id number]  [id number] LOCATION:  FACILITY: [facility]  PHYSICIAN: Troy Sine, MD, [facility] Jun 08, 1965   DATE OF PROCEDURE:  06/30/2013      RIGHT AND LEFT HEART CARDIAC CATHETERIZATION   HISTORY:  Joseph [name] is a 48 y.o. male     PROCEDURE:   The patient was brought to the second floor [place] Cardiac cath lab in the postabsorptive state. Versed 2 mg and fentanyl 50 mcg were administered for conscious sedation. The right groin was prepped and draped in sterile fashion and a 5 [place] arterial sheath and 7 French venous sheath were inserted without difficulty. A Swan-Ganz catheter was advanced into the venous sheath and pressures were obtained in the right atrium, right ventricle, pulmonary artery, and pulmonary capillary wedge position. Cardiac outputs were obtained by the thermodilution and assumed Fick methods. Oxygen saturation was obtained in the pulmonary artery and aorta. A pigtail catheter was inserted and simultaneous AO/PA pressures were recorded. The pigtail catheter was advanced into the left ventricle and simultaneous left ventricular and PCW pressures were recorded. Left ventriculography was performed in the RAO projection.  A left ventricle to aorta pullback was performed. The pigtail catheter was then removed and diagnostic catheterization to delineate the coronary anatomy was performed utilizing 5 French Judkins 4 left and right diagnostic catheters. All catheters were removed and the patient. Hemostasis was obtained by direct manual pressure. The patient tolerated the procedure well and returned to his room in satisfactory condition.   HEMODYNAMICS:   RA: 6 RV: 26/9 PA: 26/13 PC: a 18 mean 13  PA: 30/19 AO: 95/74   LV: 90/25 PC: a 21 v 19 mean 17  LV: 95/15/32 AO: 95/74  Oxygen saturation in the aorta 96% and the pulmonary artery 64%  Cardiac output: 4.2 l/min (Thermo); 3.7 (Fick)  Cardiac index: 2.2 l/m/m2                 2.0  ANGIOGRAPHY:   Left main: Angiographically normal vessel, which trifurcated into the LAD, a ramus intermediate vessel, and the left circumflex coronary artery.  LAD: Angiographically normal vessel, which gave rise to 2 proximal diagonal vessels and several septal perforating arteries.  The vessel extended to and wrapped around the LV apex.  Ramus intermediate: Angiographically normal vessel.   Left circumflex: Moderate size vessel, which gave rise to 2 marginal branches.  There was 30% mid AV groove stenosis.   Right coronary artery: Dominant vessel with 30% proximal narrowing and 40-50% mid narrowing.  The vessel supplied PDA, PLA and inferior LV branch.  Left ventriculography  reveals a dilated ventricle with severe global LV dysfunction.  There was severe global hypocontractility with an ejection fraction of less than or equal to 15%.   IMPRESSION:  Severe LV dysfunction compatible with a nonischemic cardiomyopathy and an ejection fraction of less than or equal to 15%.  Mild coronary obstructive disease with 30% narrowing in the mid AV groove circumflex, and 30% proximal and 40% mid RCA stenoses with otherwise normal LAD and ramus intermediate vessel.  RECOMMENDATION:  The patient will be started on medical therapy for a nonischemic cardiomyopathy.  He will require major lifestyle modification changes with reference to his alcohol, tobacco, and cocaine use.  Following medical  therapy, reassessment of LV function will be necessary in several months.  He may require lifevest in the interim and if  LV function does not improve  ICD implantation.  Joseph A.  Claiborne Billings, MD, [facility] 06/30/2013 11:04 AM

## 2013-06-30 NOTE — Plan of Care (Addendum)
Problem: Phase I Progression Outcomes Goal: EF % per last Echo/documented,Core Reminder form on chart Outcome: Completed/Met Date Met:  06/30/13 EF=15%

## 2013-07-01 ENCOUNTER — Encounter (HOSPITAL_COMMUNITY): Payer: Self-pay | Admitting: Physician Assistant

## 2013-07-01 ENCOUNTER — Inpatient Hospital Stay (HOSPITAL_COMMUNITY)
Admission: EM | Admit: 2013-07-01 | Discharge: 2013-07-06 | Disposition: A | Discharge status: Home / Self Care | Payer: Medicaid Other | Source: Home / Self Care | Attending: Family Medicine | Admitting: Family Medicine

## 2013-07-01 ENCOUNTER — Encounter (HOSPITAL_COMMUNITY): Payer: Self-pay | Admitting: Emergency Medicine

## 2013-07-01 ENCOUNTER — Emergency Department (HOSPITAL_COMMUNITY): Payer: Medicaid Other

## 2013-07-01 DIAGNOSIS — G459 Transient cerebral ischemic attack, unspecified: Secondary | ICD-10-CM | POA: Diagnosis present

## 2013-07-01 DIAGNOSIS — I639 Cerebral infarction, unspecified: Secondary | ICD-10-CM

## 2013-07-01 DIAGNOSIS — I509 Heart failure, unspecified: Secondary | ICD-10-CM

## 2013-07-01 DIAGNOSIS — I428 Other cardiomyopathies: Secondary | ICD-10-CM

## 2013-07-01 DIAGNOSIS — I5020 Unspecified systolic (congestive) heart failure: Secondary | ICD-10-CM | POA: Diagnosis present

## 2013-07-01 DIAGNOSIS — J90 Pleural effusion, not elsewhere classified: Secondary | ICD-10-CM

## 2013-07-01 DIAGNOSIS — I5043 Acute on chronic combined systolic (congestive) and diastolic (congestive) heart failure: Secondary | ICD-10-CM | POA: Diagnosis not present

## 2013-07-01 DIAGNOSIS — R079 Chest pain, unspecified: Secondary | ICD-10-CM

## 2013-07-01 LAB — BASIC METABOLIC PANEL
BUN: 22 mg/dL (ref 6–23)
BUN: 25 mg/dL — ABNORMAL HIGH (ref 6–23)
CALCIUM: 9.2 mg/dL (ref 8.4–10.5)
CO2: 22 mEq/L (ref 19–32)
CO2: 21 meq/L (ref 19–32)
CREATININE: 1.02 mg/dL (ref 0.50–1.35)
Calcium: 9.4 mg/dL (ref 8.4–10.5)
Chloride: 103 mEq/L (ref 96–112)
Chloride: 100 mEq/L (ref 96–112)
Creatinine, Ser: 1.13 mg/dL (ref 0.50–1.35)
GFR calc Af Amer: 87 mL/min — ABNORMAL LOW (ref >90)
GFR calc non Af Amer: 75 mL/min — ABNORMAL LOW (ref >90)
GFR, EST NON AFRICAN AMERICAN: 85 mL/min — AB (ref >90)
GLUCOSE: 172 mg/dL — AB (ref 70–99)
Glucose, Bld: 144 mg/dL — ABNORMAL HIGH (ref 70–99)
Potassium: 4.1 mEq/L (ref 3.7–5.3)
Potassium: 4.2 mEq/L (ref 3.7–5.3)
SODIUM: 137 meq/L (ref 137–147)
Sodium: 139 mEq/L (ref 137–147)

## 2013-07-01 LAB — CBC WITH DIFFERENTIAL/PLATELET
Basophils Absolute: 0 10*3/uL (ref 0.0–0.1)
Basophils Relative: 0 % (ref 0–1)
Eosinophils Absolute: 0.2 10*3/uL (ref 0.0–0.7)
Eosinophils Relative: 1 % (ref 0–5)
HEMATOCRIT: 46.2 % (ref 39.0–52.0)
HEMOGLOBIN: 15.9 g/dL (ref 13.0–17.0)
LYMPHS ABS: 5 10*3/uL — AB (ref 0.7–4.0)
LYMPHS PCT: 44 % (ref 12–46)
MCH: 34.6 pg — AB (ref 26.0–34.0)
MCHC: 34.4 g/dL (ref 30.0–36.0)
MCV: 100.7 fL — AB (ref 78.0–100.0)
MONO ABS: 1.2 10*3/uL — AB (ref 0.1–1.0)
Monocytes Relative: 11 % (ref 3–12)
Neutro Abs: 5 10*3/uL (ref 1.7–7.7)
Neutrophils Relative %: 44 % (ref 43–77)
Platelets: 174 10*3/uL (ref 150–400)
RBC: 4.59 MIL/uL (ref 4.22–5.81)
RDW: 13.7 % (ref 11.5–15.5)
WBC: 11.4 10*3/uL — AB (ref 4.0–10.5)

## 2013-07-01 LAB — GLUCOSE, CAPILLARY
GLUCOSE-CAPILLARY: 174 mg/dL — AB (ref 70–99)
Glucose-Capillary: 113 mg/dL — ABNORMAL HIGH (ref 70–99)
Glucose-Capillary: 107 mg/dL — ABNORMAL HIGH (ref 70–99)

## 2013-07-01 LAB — CBC
HCT: 45.6 % (ref 39.0–52.0)
Hemoglobin: 15.4 g/dL (ref 13.0–17.0)
MCH: 34.5 pg — ABNORMAL HIGH (ref 26.0–34.0)
MCHC: 33.8 g/dL (ref 30.0–36.0)
MCV: 102 fL — ABNORMAL HIGH (ref 78.0–100.0)
PLATELETS: 186 10*3/uL (ref 150–400)
RBC: 4.47 MIL/uL (ref 4.22–5.81)
RDW: 14.2 % (ref 11.5–15.5)
WBC: 13.4 10*3/uL — AB (ref 4.0–10.5)

## 2013-07-01 LAB — MAGNESIUM: Magnesium: 1.8 mg/dL (ref 1.5–2.5)

## 2013-07-01 LAB — TROPONIN I: TROPONIN I: 0.35 ng/mL — AB (ref <0.30)

## 2013-07-01 MED ORDER — ASPIRIN 81 MG PO TBEC: 81.0000 mg | DELAYED_RELEASE_TABLET | Freq: Every day | ORAL | Status: DC

## 2013-07-01 MED ORDER — METFORMIN HCL 500 MG PO TABS: 500.0000 mg | ORAL_TABLET | Freq: Two times a day (BID) | ORAL | Status: DC

## 2013-07-01 MED ORDER — FUROSEMIDE 40 MG PO TABS: 40.0000 mg | ORAL_TABLET | Freq: Every day | ORAL | Status: DC

## 2013-07-01 MED ORDER — ATORVASTATIN CALCIUM 40 MG PO TABS: 40.0000 mg | ORAL_TABLET | Freq: Every day | ORAL | Status: DC

## 2013-07-01 MED ORDER — METOPROLOL TARTRATE 12.5 MG HALF TABLET: 12.5000 mg | ORAL_TABLET | Freq: Two times a day (BID) | ORAL | Status: DC

## 2013-07-01 MED ORDER — METFORMIN HCL 500 MG PO TABS
500.0000 mg | ORAL_TABLET | Freq: Two times a day (BID) | ORAL | Status: DC
  Filled 2013-07-01 (×3): qty 1

## 2013-07-01 MED ORDER — BLOOD GLUCOSE METER KIT: PACK | Status: DC

## 2013-07-01 NOTE — H&P (Signed)
[facility] Admission History and Physical Service Pager: [phone]  Patient name: Joseph [name] Medical record number: [id number] Date of birth: 12-23-1965 Age: 48 y.o. Gender: male  Primary Care Provider: No PCP Per Patient Consultants: Neurology Code Status: Full code  Chief Complaint: Right sided weakness  Assessment and Plan: Joseph [name] is a 48 y.o. male presenting with right sided weakness . PMH is significant for CHF, DM, Tobacco abuse, Cocaine abuse  # TIA vs Stroke: patient's symptoms resolved. Patient has significant risk factors including smoking, hyperlipidemia and diabetes. Does not appear to have any deficit on exam. Episode does not sound like seizure activity. CT shows no acute findings and old right frontal infarct. Patient has severe heart failure that may have attributed to current presentation.  Admit to inpatient, telemetry, admitting physician Dr. Lindell Noe  Neurology recommendations (greatly appreciated) 1. Heparin anticoagulation 2. MRI/MRA in morning  Continue atorvastatin  Neuro checks q4hrs  Bedside swallow eval  Follow-up stroke team recommendations in AM. Will likely need Carotid dopplers and TEE.   # HFrEF: s/p cath with EF of 15%. Home with life vest. Echo on 4/28 did not have evidence of thrombus formation, although that could have changed since the echo.  Continue life vest  Continue metoprolol and furosemide  # Elevated troponin: troponin of 0.35 in ED. EKG does not show signs of ischemia. Patient not endorsing ACS symptoms.   Cycle troponin x3  Repeat EKG in AM  Consider touching base with cardiology in AM  # Diabetes mellitus, type 2: last A1C of 10.3  Will hold metformin while inpatient  SSI moderate; CBG's TIDAC and QHS  # Tobacco abuse:  Nicotine patch  FEN/GI: heart healthy/carb modified Prophylaxis: Heparin  Disposition: admit to inpatient, telemetry  History of Present Illness: Joseph [name] is a 48 y.o. male presenting with right sided weakness. History was provided by both patient and patient's girlfriend. Patient's girlfriend reports Joseph [name] came into the room that she was in, bent down to turn on the TV and started drooling from his right side with associated slurred speech. She also says he was combative with arms flailing in an alternating pattern. She immediately called EMS who came to assess. Patient reports right upper extremity weakness and right blurry vision. He states he was able to understand what was being said, but could not articulate his words. He remembers the events mostly but was not clear on all details pertaining to episode. When EMS got there, he had residual slurred speech, which had improved. Overall, the entire episode lasted about 10 minutes, per patient's girlfriend. His symptoms improved completely and, per patient, is back at baseline. He reports no chest pain, shortness of breath, loss of consciousness, nausea, vomiting, urine or bladder incontinence, eye deviations or rhythmic movements.  Patient was recently discharged for treatment of CHF exacerbation and sent home with a life vest. Patient reports not feeling any shocks.  Review Of Systems: Per HPI with the following additions: Otherwise 12 point review of systems was performed and was unremarkable.  Patient Active Problem List   Diagnosis Date Noted  . TIA (transient ischemic attack) 07/01/2013  . Pleural effusion 06/30/2013  . CHF (congestive heart failure) 06/28/2013  . Chest pain 06/28/2013   Past Medical History: Past Medical History  Diagnosis Date  . Hypertension   . Shortness of breath   . High cholesterol   . CHF (congestive heart failure)   . Pneumonia ?2004  . Type  II diabetes mellitus   . CAD (coronary artery disease)     a. 06/28/13 non obst dz. managed medically  . Non-ischemic cardiomyopathy     a. 06/28/13 ECHO: EF 25% and global hypokinesis and cath wuth EF 15%     Past Surgical History: Past Surgical History  Procedure Laterality Date  . 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 Smoking status: Current Every Day Smoker -- 1.00 packs/day for 31 years    Types: Cigarettes  . Smokeless tobacco: Never Used  . Alcohol Use: 19.2 oz/week    21 Cans of beer, 11 Shots of liquor per week     Comment: 06/29/2013 "2-3 beers/day; maybe 1 pint/wk"   Additional social history: 1PPD for 28 years. Cocaine.  Drinks 3-5 cans of beers once per week Please also refer to relevant sections of EMR.  Family History: History reviewed. No pertinent family history.  Allergies and Medications: No Known Allergies No current facility-administered medications on file prior to encounter.   Current Outpatient Prescriptions on File Prior to Encounter  Medication Sig Dispense Refill  . aspirin EC 81 MG EC tablet Take 1 tablet (81 mg total) by mouth daily.  30 tablet  0  . metFORMIN (GLUCOPHAGE) 500 MG tablet Take 1 tablet (500 mg total) by mouth 2 (two) times daily with a meal. Start taking on 07/02/13. Due to CATH (dye load)  procedure 4/28.  60 tablet  0  . metoprolol tartrate (LOPRESSOR) 12.5 mg TABS tablet Take 0.5 tablets (12.5 mg total) by mouth 2 (two) times daily.  60 tablet  0  . atorvastatin (LIPITOR) 40 MG tablet Take 1 tablet (40 mg total) by mouth daily at 6 PM.  30 tablet  0  . furosemide (LASIX) 40 MG tablet Take 1 tablet (40 mg total) by mouth daily.  30 tablet  0    Objective: BP 108/84  Pulse 99  Temp(Src) 98.4 F (36.9 C) (Oral)  Resp 17  SpO2 95%  Exam: General: Laying in bed in no acute distress, girlfriend at bedside HEENT: PERRL, EOMI, moist mucous membranes Cardiovascular: Regular rate and rhythm, no murmur Respiratory: Clear to auscultation bilaterally, no wheezing or rales Abdomen: Soft, non-tender, non-distended Extremities: No calf tenderness Skin: Warm,  well perfused Neuro: Alert and oriented x3. 5/5 upper and lower extremity strength. Cranial nerves symmetric and intact. Symmetric smile, no tongue deviation, shoulder shrug symmetric.  Labs and Imaging: CBC BMET   Recent Labs Lab 07/01/13 2150  WBC 11.4*  HGB 15.9  HCT 46.2  PLT 174    Recent Labs Lab 07/01/13 2150  NA 137  K 4.2  CL 100  CO2 21  BUN 25*  CREATININE 1.13  GLUCOSE 172*  CALCIUM 9.4     Cardiac Panel (last 3 results)  Recent Labs  06/29/13 0550 07/01/13 2150  TROPONINI <0.30 0.35*    Ct Head Wo Contrast  07/01/2013   CLINICAL DATA:  Headache, vertigo on  EXAM: CT HEAD WITHOUT CONTRAST  TECHNIQUE: Contiguous axial images were obtained from the base of the skull through the vertex without intravenous contrast.  COMPARISON:  None.  FINDINGS: Encephalomalacia within the right frontal lobe is compatible with remote infarct.  No acute intracranial hemorrhage or large vessel territory infarct identified. No mass lesion, midline shift, or hydrocephalus. There is no extra-axial fluid collection. Gray-white matter differentiation maintained. Cerebral volume within normal limits for patient age.  Scalp soft tissues are normal. Calvarium intact. Orbital soft tissues within normal limits.  Scattered opacity noted within the ethmoidal air cells bilaterally, right greater than left. There is mild circumferential mucosal thickening within the left sphenoid sinus. No mastoid effusion.  IMPRESSION: 1. No acute intracranial abnormality. 2. Remote right frontal lobe infarct. 3. Mild left sphenoid and ethmoidal sinus disease.   Electronically Signed   By: Jeannine Boga M.D.   On: 07/01/2013 23:38    Cordelia Poche, MD 07/01/2013, 11:38 PM PGY-1, [place] Intern pager: [phone], text pages welcome  I have seen and evaluated the above patient and I agree with the above note. My addendums are in blue.  [place] PGY-2

## 2013-07-01 NOTE — Care Management Note (Signed)
    Page 1 of 1   07/01/2013     11:31:46 AM CARE MANAGEMENT NOTE 07/01/2013  Patient:  Joseph [name], Joseph [name]   Account Number:  [id number]  Date Initiated:  07/01/2013  Documentation initiated by:  [facility]  Subjective/Objective Assessment:   48 y.o. male presenting with new onset CHF and possible COPD exacerbation vs CAP. PMH is significant for ~ 30 pack year smoking Hx, and no medical care in past 3 years.  // Home with girl friend     Action/Plan:   Plan to admit to tele, ECHO, diuresis, TSH.// Medication assistance   Anticipated DC Date:  07/02/2013   Anticipated DC Plan:  HOME/SELF CARE  In-house referral  Development worker, community  PCP / [place]  CM consult  [place] [facility]  [facility]  Follow-up appt scheduled  Medication Assistance      Choice offered to / List presented to:             Status of service:  Completed, signed off Medicare Important Message given?  NO (If response is "NO", the following Medicare IM given date fields will be blank) Date Medicare IM given:   Date Additional Medicare IM given:    Discharge Disposition:    Per UR Regulation:    If discussed at Long Length of Stay Meetings, dates discussed:    Comments:  07/01/13 [place], RN, BSN, [place] [phone] Chart reviewed.  Spoke with pt concerning his medications ans able to afford them.  Explained the [place]- Medication Assistance program to pt. Pt understand that the [facility] program is only a one time in one year from the date of discharge to next year. Pt also understand that there is a $3.00 co pay for each prescription. Will wait until discharge to  apply for  [facility] program for pt.

## 2013-07-01 NOTE — Discharge Summary (Signed)
FMTS ATTENDING  NOTE Daijon Wenke,MD I  have seen and examined this patient, reviewed their chart. I have discussed this patient with the resident. I agree with the resident's findings, assessment and care plan. 

## 2013-07-01 NOTE — ED Notes (Signed)
Admit MD at bedside

## 2013-07-01 NOTE — Progress Notes (Signed)
Family Medicine Teaching Service Daily Progress Note Intern Pager: [phone]  Patient name: Joseph [name] Medical record number: [id number] Date of birth: 03-11-1965 Age: 48 y.o. Gender: male  Primary Care Provider: No PCP Per Patient Consultants: cardiology  Code Status: full  Pt Overview and Major Events to Date:  4/27: Chest pain-cociane +; New onset HF, New daignosis DM A1c ~10 4/28: ECHO EF ~ 20%; Started Lipitor, Metformin, SSI 4/29: CATH Lab; tolerating meds well 4/30: Awaiting Life-vest  Assessment and Plan: Joseph [name] is a 48 y.o. male presenting with new onset CHF and possible COPD exacerbation vs CAP. PMH is significant for ~ 30 pack year smoking Hx, and no medical care in past 3 years.   # Chest Pain  - Substernal "chest pressure" last ~ 5 days associated with Dyspnea and orthopnea; No exertional component. Tachypnea and Tachycardia in ED with positive D-dimer, but CT Angio negative for PE. itrop negative in ED and EKG shows NSR w/ nonspecific Twave abnormalities (but incomplete upload in EPIC). CXR showed b/l pleural effusion; possible lower lobe PNA. BNP elevated. Afebrile, WBC wnl, Hgb wnl  - Most likely new onset HF with possible COPD vs CAP  - Oxygen to maintain saturations 88-92%; weaned off O2 - Strict I&Os (Net -5.5L) and daily weights (Down 9 lbs) (Currently 175; 183 admit) - BAC (Neg) and UDS (Cocaine +), D/c'd BB 4/28 - Cycle troponin's Negative x 3  - Repeat EKG: Sinus Tach; LAD,LVH, L atrial enlargement   - ECHO: EF 20% Severe global hypokinesis; LAD - ASA, - D/c'd Levaquin and Prednisone(4/27>>4/29); as hypoxia/symptoms most likely HFrEF related  - Duonebs q4hrs prn  - Lasix 40 mg PO qd - Risk Stat labs: TSH wnl, Lipids: (see below) , A1c (as below) - Cardiology consulted  CATH: EF ~ 15%, LV dysfunction compatible with a nonischemic cardiomyopathy   Rec medical therapy for a nonischemic cardiomyopathy. He will require major lifestyle modification  changes with reference to his alcohol, tobacco, and cocaine use.   Following medical therapy, reassessment of LV function will be necessary in several months.  Awaiting lifevest and if LV function does not improve ICD implantation. - Metoprolol tartrate 12.5 BID (started 4/29); Consider adding ACE-I if BP tolerates    # Tobacco Abuse, Possible COPD  - Report ~ 30 pack year smoking Hx; No diagnosis of COPD, but no medical care > 3 years  - Recommend PFTs as out patient once acute dyspnea resolves  - Duonebs as above; Hold off on Dulera until PFTs  - Nicotine patch   # DM - New onset - A1c: 10.3 - CBG AC/HS + SSI, Metformin inc to BID (to start 5/1)  # HLD -  Lipids: LDL 106, Tchol 225 - CVrisk >10%; Lipitor (started 4/28)  # Possible OSA/OHS - pt & wife report snoring  - Reccomend out patient sleep study pending  FEN/GI: Carb-mod diet; SLIV  Prophylaxis: heparin subq  Disposition: Discharge pending cardiac work-up  Subjective:  Denies current CP of SOB on 2L [place]. No palpitations   Objective: Temp:  [97.3 F (36.3 C)-98.1 F (36.7 C)] 97.9 F (36.6 C) (04/30 0503) Pulse Rate:  [91-105] 92 (04/30 0503) Resp:  [16-20] 16 (04/30 0815) BP: (92-123)/(66-91) 95/66 mmHg (04/30 0815) SpO2:  [95 %-100 %] 95 % (04/30 0817) Weight:  [175 lb 8 oz (79.606 kg)] 175 lb 8 oz (79.606 kg) (04/30 0503) Physical Exam: General: AA obese male; NAD  Cardiovascular: RRR no m/r/g; pulse 2+  Respiratory:  B/l mild bibasilar rales; normal WOB on 2L [place] O2 Abdomen: SNTND; BS +;  Extremities: WWP; No LE Edema  Skin: No rashes  Laboratory:  Recent Labs Lab 06/30/13 0354 06/30/13 1246 07/01/13 0629  WBC 14.4* 7.9 13.4*  HGB 15.6 15.5 15.4  HCT 45.3 46.0 45.6  PLT 198 193 186    Recent Labs Lab 06/28/13 0949 06/29/13 0900 06/30/13 0354 06/30/13 1246 07/01/13 0629  NA 138 138 139  --  139  K 4.4 4.0 4.1  --  4.1  CL 103 101 102  --  103  CO2 20 22 23   --  22  BUN 12 16 21   --  22   CREATININE 0.82 0.98 1.03 1.12 1.02  CALCIUM 8.4 9.3 9.4  --  9.2  PROT 6.6  --   --   --   --   BILITOT 0.7  --   --   --   --   ALKPHOS 58  --   --   --   --   ALT 33  --   --   --   --   AST 23  --   --   --   --   GLUCOSE 139* 189* 202*  --  144*   BNP    Component Value Date/Time   PROBNP 955.8* 06/28/2013 0949      Recent Labs Lab 06/28/13 0949 06/28/13 2045 06/29/13 0145 06/29/13 0550  TROPONINI <0.30 <0.30 <0.30 <0.30   UDS: Cocaine +  Imaging/Diagnostic Tests:  ECHO 06/29/13 Study Conclusions  - Left ventricle: The cavity size was normal. There was mild concentric hypertrophy. The estimated ejection fraction was 20%. Severe global hypokinesis. The study is not technically sufficient to allow evaluation of LV diastolic function. - Mitral valve: Mildly thickened leaflets . Mild regurgitation. - Left atrium: Moderately dilated (25 cm2). - Right atrium: The atrium was at the upper limits of normal in size. - Atrial septum: No defect or patent foramen ovale was identified. - Inferior vena cava: The vessel was normal in size; the respirophasic diameter changes were in the normal range (= 50%); findings are consistent with normal central venous pressure. - Pericardium, extracardiac: There was no pericardial effusion.  Ct Angio Chest W/cm &/or Wo Cm  06/28/2013 IMPRESSION: Small to moderate bilateral pleural effusions. Cardiomegaly. Bilateral lower lobe airspace opacities along with diffuse interstitial thickening. I favor this represents Edema/CHF. Bilateral lower lobe pneumonia cannot be completely excluded but felt less likely. No evidence of pulmonary embolus. Borderline and mildly enlarged mesenteric lymph nodes, presumably reactive. These could be followed with repeat CT in 3-6 months.   Dg Chest Portable 1 View  06/28/2013 IMPRESSION: Mild enlargement of cardiac silhouette. Suspected mild pulmonary edema though infection not entirely excluded.   Phill Myron, MD 07/01/2013, 9:33 AM PGY-1, [facility] Intern pager: [phone], text pages welcome

## 2013-07-01 NOTE — Progress Notes (Signed)
[facility]MTS ATTENDING  NOTE Mystique Bjelland,MD I  have seen and examined this patient, reviewed their chart. I have discussed this patient with the resident. I agree with the resident's findings, assessment and care plan. 

## 2013-07-01 NOTE — Discharge Instructions (Signed)
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   Heart Failure  You will need to follow-up with Heart Failure clinic for ongoing treatment  You were started on Metoprolol, Lipitor, Aspirin   You may need a automatic cardiac defibrillator in the future, but we will wait and reassess for heart function to see if you need that.   In the meantime, you need to wear your life-vest until told otherwise by your cardiologist.   Continued use of Cocaine, Alcohol and Tobacco will further damage your heart, and we recommend refraining from these substances.   Diabetes  You were started on Metformin to help control your blood sugar. You should take 1 pill twice day starting tomorrow morning.   You should check you blood sugar every morning before eating of taking your medications. You should also check you blood sugar two hours after your biggest meal, and any time you have symptoms of low blood sugar. Bring you meter and/or blood sugar readings to your Primary care visit  Below is information about diabetes  You can help reduce the need for medication to control your blood sugar by following a Carbohydrate Modified Diet (as discussed with the Dietitian   Heart Failure Heart failure means your heart has trouble pumping blood. This makes it hard for your body to work well. Heart failure is usually a long-term (chronic) condition. You must take good care of yourself and follow your doctor's treatment plan. HOME CARE  Take your heart medicine as told by your doctor.  Do not stop taking medicine unless your doctor tells you to.  Do not skip any dose of medicine.  Refill your medicines before they run out.  Take other medicines only as told by your doctor or pharmacist.  Stay active if told by your doctor. The elderly and people with severe heart failure should talk with a doctor about physical activity.  Eat heart healthy  foods. Choose foods that are without trans fat and are low in saturated fat, cholesterol, and salt (sodium). This includes fresh or frozen fruits and vegetables, fish, lean meats, fat-free or low-fat dairy foods, whole grains, and high-fiber foods. Lentils and dried peas and beans (legumes) are also good choices.  Limit salt if told by your doctor.  Cook in a healthy way. Roast, grill, broil, bake, poach, steam, or stir-fry foods.  Limit fluids as told by your doctor.  Weigh yourself every morning. Do this after you pee (urinate) and before you eat breakfast. Write down your weight to give to your doctor.  Take your blood pressure and write it down if your doctor tell you to.  Ask your doctor how to check your pulse. Check your pulse as told.  Lose weight if told by your doctor.  Stop smoking or chewing tobacco. Do not use gum or patches that help you quit without your doctor's approval.  Schedule and go to doctor visits as told.  Nonpregnant women should have no more than 1 drink a day. Men should have no more than 2 drinks a day. Talk to your doctor about drinking alcohol.  Stop illegal drug use.  Stay current with shots (immunizations).  Manage your health conditions as told by your doctor.  Learn to manage your stress.  Rest when you are tired.  If it is really hot outside:  Avoid intense activities.  Use air conditioning or fans, or get in a cooler place.  Avoid caffeine and  alcohol.  Wear loose-fitting, lightweight, and light-colored clothing.  If it is really cold outside:  Avoid intense activities.  Layer your clothing.  Wear mittens or gloves, a hat, and a scarf when going outside.  Avoid alcohol.  Learn about heart failure and get support as needed.  Get help to maintain or improve your quality of life and your ability to care for yourself as needed. GET HELP IF:   You gain 03 lb/1.4 kg or more in 1 day or 05 lb/2.3 kg in a week.  You are more  short of breath than usual.  You cannot do your normal activities.  You tire easily.  You cough more than normal, especially with activity.  You have any or more puffiness (swelling) in areas such as your hands, feet, ankles, or belly (abdomen).  You cannot sleep because it is hard to breathe.  You feel like your heart is beating fast (palpitations).  You get dizzy or lightheaded when you stand up. GET HELP RIGHT AWAY IF:   You have trouble breathing.  There is a change in mental status, such as becoming less alert or not being able to focus.  You have chest pain or discomfort.  You faint. MAKE SURE YOU:   Understand these instructions.  Will watch your condition.  Will get help right away if you are not doing well or get worse.  Type 2 Diabetes Mellitus, Adult Type 2 diabetes mellitus, often simply referred to as type 2 diabetes, is a long-lasting (chronic) disease. In type 2 diabetes, the pancreas does not make enough insulin (a hormone), the cells are less responsive to the insulin that is made (insulin resistance), or both. Normally, insulin moves sugars from food into the tissue cells. The tissue cells use the sugars for energy. The lack of insulin or the lack of normal response to insulin causes excess sugars to build up in the blood instead of going into the tissue cells. As a result, high blood sugar (hyperglycemia) develops. The effect of high sugar (glucose) levels can cause many complications. Type 2 diabetes was also previously called adult-onset diabetes but it can occur at any age.  RISK FACTORS  A person is predisposed to developing type 2 diabetes if someone in the family has the disease and also has one or more of the following primary risk factors:  Overweight.  An inactive lifestyle.  A history of consistently eating high-calorie foods. Maintaining a normal weight and regular physical activity can reduce the chance of developing type 2  diabetes. SYMPTOMS  A person with type 2 diabetes may not show symptoms initially. The symptoms of type 2 diabetes appear slowly. The symptoms include:  Increased thirst (polydipsia).  Increased urination (polyuria).  Increased urination during the night (nocturia).  Weight loss. This weight loss may be rapid.  Frequent, recurring infections.  Tiredness (fatigue).  Weakness.  Vision changes, such as blurred vision.  Fruity smell to your breath.  Abdominal pain.  Nausea or vomiting.  Cuts or bruises which are slow to heal.  Tingling or numbness in the hands or feet. DIAGNOSIS Type 2 diabetes is frequently not diagnosed until complications of diabetes are present. Type 2 diabetes is diagnosed when symptoms or complications are present and when blood glucose levels are increased. Your blood glucose level may be checked by one or more of the following blood tests:  A fasting blood glucose test. You will not be allowed to eat for at least 8 hours before a blood sample  is taken.  A random blood glucose test. Your blood glucose is checked at any time of the day regardless of when you ate.  A hemoglobin A1c blood glucose test. A hemoglobin A1c test provides information about blood glucose control over the previous 3 months.  An oral glucose tolerance test (OGTT). Your blood glucose is measured after you have not eaten (fasted) for 2 hours and then after you drink a glucose-containing beverage. TREATMENT   You may need to take insulin or diabetes medicine daily to keep blood glucose levels in the desired range.  You will need to match insulin dosing with exercise and healthy food choices. The treatment goal is to maintain the before meal blood sugar (preprandial glucose) level at 70 130 mg/dL. HOME CARE INSTRUCTIONS   Have your hemoglobin A1c level checked twice a year.  Perform daily blood glucose monitoring as directed by your caregiver.  Monitor urine ketones when you are  ill and as directed by your caregiver.  Take your diabetes medicine or insulin as directed by your caregiver to maintain your blood glucose levels in the desired range.  Never run out of diabetes medicine or insulin. It is needed every day.  Adjust insulin based on your intake of carbohydrates. Carbohydrates can raise blood glucose levels but need to be included in your diet. Carbohydrates provide vitamins, minerals, and fiber which are an essential part of a healthy diet. Carbohydrates are found in fruits, vegetables, whole grains, dairy products, legumes, and foods containing added sugars.    Eat healthy foods. Alternate 3 meals with 3 snacks.  Lose weight if overweight.  Carry a medical alert card or wear your medical alert jewelry.  Carry a 15 gram carbohydrate snack with you at all times to treat low blood glucose (hypoglycemia). Some examples of 15 gram carbohydrate snacks include:  Glucose tablets, 3 or 4   Glucose gel, 15 gram tube  Raisins, 2 tablespoons (24 grams)  Jelly beans, 6  Animal crackers, 8  Regular pop, 4 ounces (120 mL)  Gummy treats, 9  Recognize hypoglycemia. Hypoglycemia occurs with blood glucose levels of 70 mg/dL and below. The risk for hypoglycemia increases when fasting or skipping meals, during or after intense exercise, and during sleep. Hypoglycemia symptoms can include:  Tremors or shakes.  Decreased ability to concentrate.  Sweating.  Increased heart rate.  Headache.  Dry mouth.  Hunger.  Irritability.  Anxiety.  Restless sleep.  Altered speech or coordination.  Confusion.  Treat hypoglycemia promptly. If you are alert and able to safely swallow, follow the 15:15 rule:  Take 15 20 grams of rapid-acting glucose or carbohydrate. Rapid-acting options include glucose gel, glucose tablets, or 4 ounces (120 mL) of fruit juice, regular soda, or low fat milk.  Check your blood glucose level 15 minutes after taking the  glucose.  Take 15 20 grams more of glucose if the repeat blood glucose level is still 70 mg/dL or below.  Eat a meal or snack within 1 hour once blood glucose levels return to normal.    Be alert to polyuria and polydipsia which are early signs of hyperglycemia. An early awareness of hyperglycemia allows for prompt treatment. Treat hyperglycemia as directed by your caregiver.  Engage in at least 150 minutes of moderate-intensity physical activity a week, spread over at least 3 days of the week or as directed by your caregiver. In addition, you should engage in resistance exercise at least 2 times a week or as directed by your caregiver.  Adjust your medicine and food intake as needed if you start a new exercise or sport.  Follow your sick day plan at any time you are unable to eat or drink as usual.  Avoid tobacco use.  Limit alcohol intake to no more than 1 drink per day for nonpregnant women and 2 drinks per day for men. You should drink alcohol only when you are also eating food. Talk with your caregiver whether alcohol is safe for you. Tell your caregiver if you drink alcohol several times a week.  Follow up with your caregiver regularly.  Schedule an eye exam soon after the diagnosis of type 2 diabetes and then annually.  Perform daily skin and foot care. Examine your skin and feet daily for cuts, bruises, redness, nail problems, bleeding, blisters, or sores. A foot exam by a caregiver should be done annually.  Brush your teeth and gums at least twice a day and floss at least once a day. Follow up with your dentist regularly.  Share your diabetes management plan with your workplace or school.  Stay up-to-date with immunizations.  Learn to manage stress.  Obtain ongoing diabetes education and support as needed.  Participate in, or seek rehabilitation as needed to maintain or improve independence and quality of life. Request a physical or occupational therapy referral if you  are having foot or hand numbness or difficulties with grooming, dressing, eating, or physical activity. SEEK MEDICAL CARE IF:   You are unable to eat food or drink fluids for more than 6 hours.  You have nausea and vomiting for more than 6 hours.  Your blood glucose level is over 240 mg/dL.  There is a change in mental status.  You develop an additional serious illness.  You have diarrhea for more than 6 hours.  You have been sick or have had a fever for a couple of days and are not getting better.  You have pain during any physical activity.  SEEK IMMEDIATE MEDICAL CARE IF:  You have difficulty breathing.  You have moderate to large ketone levels. MAKE SURE YOU:  Understand these instructions.  Will watch your condition.  Will get help right away if you are not doing well or get worse.  Diabetes, Eating Away From Home Sometimes, you might eat in a restaurant or have meals that are prepared by someone else. You can enjoy eating out. However, the portions in restaurants may be much larger than needed. Listed below are some ideas to help you choose foods that will keep your blood glucose (sugar) in better control.  TIPS FOR EATING OUT  Know your meal plan and how many carbohydrate servings you should have at each meal. You may wish to carry a copy of your meal plan in your purse or wallet. Learn the foods included in each food group.  Make a list of restaurants near you that offer healthy choices. Take a copy of the carry-out menus to see what they offer. Then, you can plan what you will order ahead of time.  Become familiar with serving sizes by practicing them at home using measuring cups and spoons. Once you learn to recognize portion sizes, you will be able to correctly estimate the amount of total carbohydrate you are allowed to eat at the restaurant. Ask for a takeout box if the portion is more than you should have. When your food comes, leave the amount you should have on  the plate, and put the rest in the takeout box before  you start eating.  Plan ahead if your mealtime will be different from usual. Check with your caregiver to find out how to time meals and medicine if you are taking insulin.  Avoid high-fat foods, such as fried foods, cream sauces, high-fat salad dressings, or any added butter or margarine.  Do not be afraid to ask questions. Ask your server about the portion size, cooking methods, ingredients and if items can be substituted. Restaurants do not list all available items on the menu. You can ask for your main entree to be prepared using skim milk, oil instead of butter or margarine, and without gravy or sauces. Ask your waiter or waitress to serve salad dressings, gravy, sauces, margarine, and sour cream on the side. You can then add the amount your meal plan suggests.  Add more vegetables whenever possible.  Avoid items that are labeled "jumbo," "giant," "deluxe," or "supersized."  You may want to split an entre with someone and order an extra side salad.  Watch for hidden calories in foods like croutons, bacon, or cheese.  Ask your server to take away the bread basket or chips from your table.  Order a dinner salad as an appetizer. You can eat most foods served in a restaurant. Some foods are better choices than others. Breads and Starches  Recommended: All kinds of bread (wheat, rye, white, oatmeal, [place], [place], raisin), hard or soft dinner rolls, frankfurter or hamburger buns, small bagels, small corn or whole-wheat flour tortillas.  Avoid: Frosted or glazed breads, butter rolls, egg or cheese breads, croissants, sweet rolls, pastries, coffee cake, glazed or frosted doughnuts, muffins. Crackers  Recommended: Animal crackers, graham, rye, saltine, oyster, and matzoth crackers. Bread sticks, melba toast, rusks, pretzels, popcorn (without fat), zwieback toast.  Avoid: High-fat snack crackers or chips. Buttered  popcorn. Cereals  Recommended: Hot and cold cereals. Whole grains such as oatmeal or shredded wheat are good choices.  Avoid: Sugar-coated or granola type cereals. Potatoes/Pasta/Rice/Beans  Recommended: Order baked, boiled, or mashed potatoes, rice or noodles without added fat, whole beans. Order gravies, butter, margarine, or sauces on the side so you can control the amount you add.  Avoid: Hash browns or fried potatoes. Potatoes, pasta, or rice prepared with cream or cheese sauce. Potato or pasta salads prepared with large amounts of dressing. Fried beans or fried rice. Vegetables  Recommended: Order steamed, baked, boiled, or stewed vegetables without sauces or extra fat. Ask that sauce be served on the side. If vegetables are not listed on the menu, ask what is available.  Avoid: Vegetables prepared with cream, butter, or cheese sauce. Fried vegetables. Salad Bars  Recommended: Many of the vegetables at a salad bar are considered "free." Use lemon juice, vinegar, or low-calorie salad dressing (fewer than 20 calories per serving) as "free" dressings for your salad. Look for salad bar ingredients that have no added fat or sugar such as tomatoes, lettuce, cucumbers, broccoli, carrots, onions, and mushrooms.  Avoid: Prepared salads with large amounts of dressing, such as coleslaw, caesar salad, macaroni salad, bean salad, or carrot salad. Fruit  Recommended: Eat fresh fruit or fresh fruit salad without added dressing. A salad bar often offers fresh fruit choices, but canned fruit at a restaurant is usually packed in sugar or syrup.  Avoid: Sweetened canned or frozen fruits, plain or sweetened fruit juice. Fruit salads with dressing, sour cream, or sugar added to them. Meat and Meat Substitutes  Recommended: Order broiled, baked, roasted, or grilled meat, poultry, or fish. Trim  off all visible fat. Do not eat the skin of poultry. The size stated on the menu is the raw weight. Meat shrinks  by  in cooking (for example, 4 oz raw equals 3 oz cooked meat).  Avoid: Deep-fat fried meat, poultry, or fish. Breaded meats. Eggs  Recommended: Order soft, hard-cooked, poached, or scrambled eggs. Omelets may be okay, depending on what ingredients are added. Egg substitutes are also a good choice.  Avoid: Fried eggs, eggs prepared with cream or cheese sauce. Milk  Recommended: Order low-fat or fat-free milk according to your meal plan. Plain, nonfat yogurt or flavored yogurt with no sugar added may be used as a substitute for milk. Soy milk may also be used.  Avoid: Milk shakes or sweetened milk beverages. Soups and Combination Foods  Recommended: Clear broth or consomm are "free" foods and may be used as an appetizer. Broth-based soups with fat removed count as a starch serving and are preferred over cream soups. Soups made with beans or split peas may be eaten but count as a starch.  Avoid: Fatty soups, soup made with cream, cheese soup. Combination foods prepared with excessive amounts of fat or with cream or cheese sauces. Desserts and Sweets  Recommended: Ask for fresh fruit. Sponge or angel food cake without icing, ice milk, no sugar added ice cream, sherbet, or frozen yogurt may fit into your meal plan occasionally.  Avoid: Pastries, puddings, pies, cakes with icing, custard, gelatin desserts. Fats and Oils  Recommended: Choose healthy fats such as olive oil, canola oil, or tub margarine, reduced fat or fat-free sour cream, cream cheese, avocado, or nuts.  Avoid: Any fats in excess of your allowed portion. Deep-fried foods or any food with a large amount of fat. Note: Ask for all fats to be served on the side, and limit your portion sizes according to your meal plan. Document Released: 02/18/2005 Document Revised: 05/13/2011 Document Reviewed: 09/08/2008 [facility] Patient Information 2014 [place], [place].

## 2013-07-01 NOTE — Progress Notes (Signed)
Patient Name: Joseph [name] Date of Encounter: 07/01/2013     Active Problems:   CHF (congestive heart failure)   Chest pain   Pleural effusion    SUBJECTIVE  Feeling well. No SOB or CP.   CURRENT MEDS . aspirin EC  81 mg Oral Daily  . atorvastatin  40 mg Oral q1800  . furosemide  40 mg Oral Once  . furosemide  40 mg Oral Daily  . heparin  5,000 Units Subcutaneous 3 times per day  . insulin aspart  0-15 Units Subcutaneous TID WC  . insulin aspart  0-5 Units Subcutaneous QHS  . insulin aspart  4 Units Subcutaneous TID WC  . metFORMIN  500 mg Oral BID WC  . metoprolol tartrate  12.5 mg Oral BID  . nicotine  14 mg Transdermal Daily    OBJECTIVE  Filed Vitals:   07/01/13 0503 07/01/13 0607 07/01/13 0815 07/01/13 0817  BP: 92/67 98/70 95/66    Pulse: 92     Temp: 97.9 F (36.6 C)     TempSrc: Oral     Resp: 18  16   Height:      Weight: 175 lb 8 oz (79.606 kg)     SpO2: 97%  99% 95%    Intake/Output Summary (Last 24 hours) at 07/01/13 1022 Last data filed at 07/01/13 0932  Gross per 24 hour  Intake 1936.25 ml  Output   1825 ml  Net 111.25 ml   Filed Weights   06/29/13 0500 06/30/13 0608 07/01/13 0503  Weight: 179 lb 3.7 oz (81.3 kg) 174 lb 9.6 oz (79.198 kg) 175 lb 8 oz (79.606 kg)    PHYSICAL EXAM  General: Pleasant, NAD. AA obese male; Neuro: Alert and oriented X 3. Moves all extremities spontaneously. Psych: Normal affect. HEENT:  Normal  Neck: Supple without bruits or JVD. Lungs: CTAB Heart: RRR no s3, s4, or murmurs. Abdomen: Soft, non-tender, non-distended, BS + x 4.  Extremities: No clubbing, cyanosis or edema. DP/PT/Radials 2+ and equal bilaterally.  Accessory Clinical Findings  CBC  Recent Labs  06/30/13 1246 07/01/13 0629  WBC 7.9 13.4*  HGB 15.5 15.4  HCT 46.0 45.6  MCV 101.5* 102.0*  PLT 193 [id number]   Basic Metabolic Panel  Recent Labs  06/30/13 0354 06/30/13 1246 07/01/13 0629  NA 139  --  139  K 4.1  --  4.1  CL 102   --  103  CO2 23  --  22  GLUCOSE 202*  --  144*  BUN 21  --  22  CREATININE 1.03 1.12 1.02  CALCIUM 9.4  --  9.2   Cardiac Enzymes  Recent Labs  06/28/13 2045 06/29/13 0145 06/29/13 0550  TROPONINI <0.30 <0.30 <0.30    Hemoglobin A1C  Recent Labs  06/28/13 1701  HGBA1C 10.3*   Fasting Lipid Panel  Recent Labs  06/28/13 1701  CHOL 225*  HDL 109  LDLCALC 106*  TRIG 52  CHOLHDL 2.1   Thyroid Function Tests  Recent Labs  06/28/13 1701  TSH 0.562    TELE  Sinus tach HR 120s  Radiology/Studies  Ct Angio Chest W/cm &/or Wo Cm  06/28/2013   CLINICAL DATA:  Chest pain, shortness of breath.  EXAM: CT ANGIOGRAPHY CHEST WITH CONTRAST  TECHNIQUE: Multidetector CT imaging of the chest was performed using the standard protocol during bolus administration of intravenous contrast. Multiplanar CT image reconstructions and MIPs were obtained to evaluate the vascular anatomy.  CONTRAST:  15mL  OMNIPAQUE IOHEXOL 350 MG/ML SOLN  COMPARISON:  04/25/2008  FINDINGS: No filling defects in the pulmonary arteries to suggest pulmonary emboli. There are small to moderate bilateral pleural effusions. Bilateral lower lobe airspace opacities are noted. Mild interstitial prominence/ thickening. There is cardiomegaly. Scattered coronary artery calcifications. Aorta is normal caliber. No aneurysm or dissection.  Mildly prominent scattered mediastinal lymph nodes. Index right paratracheal lymph node has a short axis diameter of 14 mm. Other similarly sized mediastinal lymph nodes scattered throughout the mediastinum. These are presumably reactive/ related to congestion.  Chest wall soft tissues are unremarkable. Imaging into the upper abdomen shows no acute findings. No acute bony abnormality or focal bone lesion.  Review of the MIP images confirms the above findings.  IMPRESSION: Small to moderate bilateral pleural effusions.  Cardiomegaly.  Bilateral lower lobe airspace opacities along with diffuse  interstitial thickening. I favor this represents Edema/CHF. Bilateral lower lobe pneumonia cannot be completely excluded but felt less likely.  No evidence of pulmonary embolus.  Borderline and mildly enlarged mesenteric lymph nodes, presumably reactive. These could be followed with repeat CT in 3-6 months.    Dg Chest Portable 1 View  06/28/2013   CLINICAL DATA:  Chest pain, shortness of breath, smoker  EXAM: PORTABLE CHEST - 1 VIEW  COMPARISON:  Portable exam 0942 hr compared to 04/25/2008  FINDINGS: Enlargement of cardiac silhouette.  Mediastinal contours and pulmonary vascularity normal.  Interstitial infiltrates at the mid to lower lungs question pulmonary edema.  No gross pleural effusion or pneumothorax.  Osseous structures unremarkable.  IMPRESSION: Mild enlargement of cardiac silhouette.  Suspected mild pulmonary edema though infection not entirely excluded.     ECHO: Study Conclusions--06/29/13 - Left ventricle: The cavity size was normal. There was mild concentric hypertrophy. The estimated ejection fraction was 20%. Severe global hypokinesis. The study is not technically sufficient to allow evaluation of LV diastolic function. - Mitral valve: Mildly thickened leaflets . Mild regurgitation. - Left atrium: Moderately dilated (25 cm2). - Right atrium: The atrium was at the upper limits of normal in size. - Atrial septum: No defect or patent foramen ovale was identified. - Inferior vena cava: The vessel was normal in size; the respirophasic diameter changes were in the normal range (= 50%); findings are consistent with normal central venous pressure. - Pericardium, extracardiac: There was no pericardial effusion.  RIGHT AND LEFT HEART CARDIAC CATHETERIZATION  HISTORY:  Joseph [name] is a 48 y.o. male  PROCEDURE:  The patient was brought to the second floor [facility] cath lab in the postabsorptive state. Versed 2 mg and fentanyl 50 mcg were administered for conscious  sedation. The right groin was prepped and draped in sterile fashion and a 5 [place] arterial sheath and 7 French venous sheath were inserted without difficulty. A Swan-Ganz catheter was advanced into the venous sheath and pressures were obtained in the right atrium, right ventricle, pulmonary artery, and pulmonary capillary wedge position. Cardiac outputs were obtained by the thermodilution and assumed Fick methods. Oxygen saturation was obtained in the pulmonary artery and aorta. A pigtail catheter was inserted and simultaneous AO/PA pressures were recorded. The pigtail catheter was advanced into the left ventricle and simultaneous left ventricular and PCW pressures were recorded. Left ventriculography was performed in the RAO projection. A left ventricle to aorta pullback was performed. The pigtail catheter was then removed and diagnostic catheterization to delineate the coronary anatomy was performed utilizing 5 French Judkins 4 left and right diagnostic catheters. All catheters were  removed and the patient. Hemostasis was obtained by direct manual pressure. The patient tolerated the procedure well and returned to his room in satisfactory condition.  HEMODYNAMICS:  RA: 6  RV: 26/9  PA: 26/13  PC: a 18 mean 13  PA: 30/19  AO: 95/74  LV: 90/25  PC: a 21 v 19 mean 17  LV: 95/15/32  AO: 95/74  Oxygen saturation in the aorta 96% and the pulmonary artery 64%  Cardiac output: 4.2 l/min (Thermo); 3.7 (Fick)  Cardiac index: 2.2 l/m/m2 2.0  ANGIOGRAPHY:  Left main: Angiographically normal vessel, which trifurcated into the LAD, a ramus intermediate vessel, and the left circumflex coronary artery.  LAD: Angiographically normal vessel, which gave rise to 2 proximal diagonal vessels and several septal perforating arteries. The vessel extended to and wrapped around the LV apex.  Ramus intermediate: Angiographically normal vessel.  Left circumflex: Moderate size vessel, which gave rise to 2 marginal branches.  There was 30% mid AV groove stenosis.  Right coronary artery: Dominant vessel with 30% proximal narrowing and 40-50% mid narrowing. The vessel supplied PDA, PLA and inferior LV branch.  Left ventriculography reveals a dilated ventricle with severe global LV dysfunction. There was severe global hypocontractility with an ejection fraction of less than or equal to 15%.  IMPRESSION:  Severe LV dysfunction compatible with a nonischemic cardiomyopathy and an ejection fraction of less than or equal to 15%.  Mild coronary obstructive disease with 30% narrowing in the mid AV groove circumflex, and 30% proximal and 40% mid RCA stenoses with otherwise normal LAD and ramus intermediate vessel.  RECOMMENDATION:  The patient will be started on medical therapy for a nonischemic cardiomyopathy. He will require major lifestyle modification changes with reference to his alcohol, tobacco, and cocaine use. Following medical therapy, reassessment of LV function will be necessary in several months. He may require lifevest in the interim and if LV function does not improve ICD implantation.     ASSESSMENT AND PLAN  Joseph [name] is a 48 y.o. male presenting with new onset CHF and possible COPD exacerbation vs CAP. PMH is significant for ~ 30 pack year smoking Hx, and no medical care in past 3 years. He presented on 06/28/13 with chest pain in the setting of cocaine use.   Acute on chronic systolic congestive heart failure/ Non-ischemic CM -- 2D ECHO 06/29/13 with EF 20% and severe global hypokinesis.  -- S/p LHC/RHC 06/30/13 revealing severe LV dysfunction compatible with a nonischemic cardiomyopathy: EF~ 15%. -- He was started on medical therapy for a nonischemic cardiomyopathy and will require major lifestyle modification changes with reference to his alcohol, tobacco, and cocaine use. Following medical therapy, reassessment of LV function will be necessary in several months.  -- Continue Lasix 40 po qd, metop tart  12.5mg  BID, add ACE/ARB for LV dysfunction when BP can tolerate. Creat stable. -- Down 8lbs, neg 5.6 L -- Will require lifevest in the interim and if LV function does not improve ICD implantation. I have contacted the representative and faxed in paperwork.    Non-Obs CAD- cath with mild CAD with 30% narrowing in the mid AV groove circumflex, and 30% proximal and 40% mid RCA stenoses with otherwise normal LAD and ramus intermediate vessel.  -- Cont asa, statin and BB   HLD- continue Lipitor 40mg   DM- HgA1c 10.3 (newly diagnosed DM). Metformin should be held for 48 hours post contrast dye exposure. Can restart Sat morning.  HTN- BP soft, add ACE/ARB for severe LV  dysfunction when BP recovers.   Tobacco abuse- nicotine patch  CAP- treated with appropriate ABx per IM  Signed, Perry Mount PA-C  Pager [phone]   Patient seen and examined along with Perry Mount, PA-C. I agree with their findings, examination impression/recommendations. And man with a new diagnosis of systolic heart failure with EF of roughly 20% by echocardiogram. Left heart catheterization suggest even worse in EF. Minimal CAD would suggest nonischemic cardiomyopathy. With EF in the 15-20% range, he would benefit from LifeVest protection. We have initiated the paperwork. It would be nice to have it placed prior to discharge, however this can be followed to in the outpatient setting as he has not had any significant arrhythmias in hospital.  Currently on beta blocker and ACE inhibitor at low dose as previously titrated up in the outpatient setting. He is on oral Lasix as he has no active heart failure symptoms currently. Adequately diuresed.  Will need close followup next week to assess, would then thoroughly titrate up beta blocker and ACE inhibitor. Would be better to use carvedilol as opposed to metoprolol tartrate, but currently blood pressure would not tolerate.  Recheck echocardiogram and a 3 months to reassess. At  that time, we would consider possibly referral for ICD.  He is otherwise stable for discharge. Ms. Vertell Limber has initiated a work for [employer].  Discharge followup has been set.  Leonie Man, MD

## 2013-07-01 NOTE — ED Notes (Addendum)
Pt reports to the ED for eval of possible TIA symptoms. Pt was recently admitted for a CHF exacerbation and was d/c this afternoon and as soon as he got home his wife came in and found him slumped over to the right as well as slurred speech, drooling, and right sided facial droop. Upon EMS arrival pt noted to have all of these symptoms as well as right sided arm drift and right hand weakness. Symptoms resolved at 20:30. LSN was approx 20:00. 12 lead showed sinus tachy in the 100s-120s. Pt has a defibrillator vest in place for his cardiomyopathy and low ejection fraction. Pt A&Ox4, resp e/u, and skin warm and dry. No neuro symptoms noted at this time.

## 2013-07-01 NOTE — ED Provider Notes (Signed)
CSN: [id number]     Arrival date & time 07/01/13  2056 History   First MD Initiated Contact with Patient 07/01/13 2103     Chief Complaint  Patient presents with  . Transient Ischemic Attack      HPI  Pt with CVA sx.  LTKN 20:00.  Patient recently discharged after an evaluation including cardiac catheterization. Diagnosis was nonischemic cardiomyopathy with EF of 18% cream at home wearing a life vest defibrillator. His early just got to his home. Walk to the kitchen. Walked into the living room and was adjusting the TV. Wife say that he was confused. Started slurring of speech. His face drooped. He was drooling out the right side of his mouth. He did not have any cardiac event palpitations or delivery of life vest shocks.  Arrival of EMS was noted to have a pronator drift of the right arm. He is asymptomatic with normal exam on his arrival here.  Past Medical History  Diagnosis Date  . Hypertension   . Shortness of breath   . High cholesterol   . CHF (congestive heart failure)   . Pneumonia ?2004  . Type II diabetes mellitus   . CAD (coronary artery disease)     a. 06/28/13 non obst dz. managed medically  . Non-ischemic cardiomyopathy     a. 06/28/13 ECHO: EF 25% and global hypokinesis and cath wuth EF 15%    Past Surgical History  Procedure Laterality Date  . 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 No pertinent family history. History  Substance Use Topics  . Smoking status: Current Every Day Smoker -- 1.00 packs/day for 31 years    Types: Cigarettes  . Smokeless tobacco: Never Used  . Alcohol Use: 19.2 oz/week    21 Cans of beer, 11 Shots of liquor per week     Comment: 06/29/2013 "2-3 beers/day; maybe 1 pint/wk"    Review of Systems  Constitutional: Negative for fever, chills, diaphoresis, appetite change and fatigue.  HENT: Negative for mouth sores, sore throat and trouble swallowing.   Eyes: Negative for visual  disturbance.  Respiratory: Negative for cough, chest tightness, shortness of breath and wheezing.   Cardiovascular: Negative for chest pain and palpitations.  Gastrointestinal: Negative for nausea, vomiting, abdominal pain, diarrhea and abdominal distention.  Endocrine: Negative for polydipsia, polyphagia and polyuria.  Genitourinary: Negative for dysuria, frequency and hematuria.  Musculoskeletal: Negative for gait problem.  Skin: Negative for color change, pallor and rash.  Neurological: Positive for speech difficulty, weakness and numbness. Negative for dizziness, syncope, light-headedness and headaches.  Hematological: Does not bruise/bleed easily.  Psychiatric/Behavioral: Negative for behavioral problems and confusion.      Allergies  Review of patient's allergies indicates no known allergies.  Home Medications   Prior to Admission medications   Medication Sig Start Date End Date Taking? Authorizing Provider  aspirin EC 81 MG EC tablet Take 1 tablet (81 mg total) by mouth daily. 07/01/13  Yes Phill Myron, MD  metFORMIN (GLUCOPHAGE) 500 MG tablet Take 1 tablet (500 mg total) by mouth 2 (two) times daily with a meal. Start taking on 07/02/13. Due to CATH (dye load)  procedure 4/28. 07/01/13  Yes Phill Myron, MD  metoprolol tartrate (LOPRESSOR) 12.5 mg TABS tablet Take 0.5 tablets (12.5 mg total) by mouth 2 (two) times daily. 07/01/13  Yes Phill Myron, MD  atorvastatin (LIPITOR) 40 MG tablet Take 1 tablet (40 mg total) by mouth daily at  6 PM. 07/01/13   Phill Myron, MD  furosemide (LASIX) 40 MG tablet Take 1 tablet (40 mg total) by mouth daily. 07/01/13   Phill Myron, MD   BP 116/74  Pulse 95  Temp(Src) 98.4 F (36.9 C) (Oral)  Resp 18  SpO2 96% Physical Exam  Constitutional: He is oriented to person, place, and time. He appears well-developed and well-nourished. No distress.  HENT:  Head: Normocephalic.  Eyes: Conjunctivae are normal. Pupils are equal, round, and reactive to  light. No scleral icterus.  Neck: Normal range of motion. Neck supple. No thyromegaly present.  Cardiovascular: Normal rate and regular rhythm.  Exam reveals no gallop and no friction rub.   No murmur heard. Pulmonary/Chest: Effort normal and breath sounds normal. No respiratory distress. He has no wheezes. He has no rales.  Abdominal: Soft. Bowel sounds are normal. He exhibits no distension. There is no tenderness. There is no rebound.  Musculoskeletal: Normal range of motion.  Neurological: He is alert and oriented to person, place, and time.  No burning or drift. Normal cranial nerves. Symmetric smile. Normal lower extremity exam. Normal sensation. Normal mental status. Normal visual fields.  Skin: Skin is warm and dry. No rash noted.  Psychiatric: He has a normal mood and affect. His behavior is normal.    ED Course  Procedures (including critical care time) Labs Review Labs Reviewed  CBC WITH DIFFERENTIAL - Abnormal; Notable for the following:    WBC 11.4 (*)    MCV 100.7 (*)    MCH 34.6 (*)    Lymphs Abs 5.0 (*)    Monocytes Absolute 1.2 (*)    All other components within normal limits  BASIC METABOLIC PANEL - Abnormal; Notable for the following:    Glucose, Bld 172 (*)    BUN 25 (*)    GFR calc non Af Amer 75 (*)    GFR calc Af Amer 87 (*)    All other components within normal limits  TROPONIN I - Abnormal; Notable for the following:    Troponin I 0.35 (*)    All other components within normal limits  MAGNESIUM  URINE RAPID DRUG SCREEN (HOSP PERFORMED)    Imaging Review Ct Head Wo Contrast  07/01/2013   CLINICAL DATA:  Headache, vertigo on  EXAM: CT HEAD WITHOUT CONTRAST  TECHNIQUE: Contiguous axial images were obtained from the base of the skull through the vertex without intravenous contrast.  COMPARISON:  None.  FINDINGS: Encephalomalacia within the right frontal lobe is compatible with remote infarct.  No acute intracranial hemorrhage or large vessel territory  infarct identified. No mass lesion, midline shift, or hydrocephalus. There is no extra-axial fluid collection. Gray-white matter differentiation maintained. Cerebral volume within normal limits for patient age.  Scalp soft tissues are normal. Calvarium intact. Orbital soft tissues within normal limits.  Scattered opacity noted within the ethmoidal air cells bilaterally, right greater than left. There is mild circumferential mucosal thickening within the left sphenoid sinus. No mastoid effusion.  IMPRESSION: 1. No acute intracranial abnormality. 2. Remote right frontal lobe infarct. 3. Mild left sphenoid and ethmoidal sinus disease.   Electronically Signed   By: Jeannine Boga M.D.   On: 07/01/2013 23:38     EKG Interpretation None      MDM   Final diagnoses:  TIA (transient ischemic attack)    Patient with prior myopathy and EF of 18% with TIA. Formal report of head CT pending. If no radiographically signs of ongoing ischemia would require  anticoagulation. Cannot get MRI currently because of his life vest. Care discussed with medicine resident. Discussed case with Dr. Leonel Ramsay of neurology. He will see the patient in consultation.    Tanna Furry, MD 07/01/13 2350

## 2013-07-01 NOTE — Progress Notes (Signed)
  RD consulted for nutrition education regarding diabetes.   Lab Results  Component Value Date   HGBA1C 10.3* 06/28/2013    RD provided "Carbohydrate Counting for People with Diabetes" handout from the Academy of [employer] and Dietetics. Discussed different food groups and their effects on blood sugar, emphasizing carbohydrate-containing foods. Provided list of carbohydrates and recommended serving sizes of common foods. Additionally provided "1800-Calorie 5-Day Sample Menus".   Discussed importance of controlled and consistent carbohydrate intake throughout the day. Provided examples of ways to balance meals/snacks and encouraged intake of high-fiber, whole grain complex carbohydrates. Teach back method used. Pt able to report carbohydrate goal per meal and reports foods that he will decrease/avoid.  Expect good compliance.  Body mass index is 29.2 kg/(m^2). Pt meets criteria for Overweight based on current BMI.  Current diet order is Heart Healthy/Carb Modified, patient is consuming approximately 100% of meals at this time. Labs and medications reviewed. No further nutrition interventions warranted at this time. RD contact information provided. If additional nutrition issues arise, please re-consult RD.  Pryor Ochoa RD, LDN Inpatient Clinical Dietitian Pager: [phone] After Hours Pager: [phone]

## 2013-07-02 DIAGNOSIS — I509 Heart failure, unspecified: Secondary | ICD-10-CM

## 2013-07-02 DIAGNOSIS — I5043 Acute on chronic combined systolic (congestive) and diastolic (congestive) heart failure: Secondary | ICD-10-CM

## 2013-07-02 DIAGNOSIS — I428 Other cardiomyopathies: Secondary | ICD-10-CM

## 2013-07-02 DIAGNOSIS — I5020 Unspecified systolic (congestive) heart failure: Secondary | ICD-10-CM | POA: Diagnosis present

## 2013-07-02 DIAGNOSIS — G459 Transient cerebral ischemic attack, unspecified: Secondary | ICD-10-CM

## 2013-07-02 LAB — RAPID URINE DRUG SCREEN, HOSP PERFORMED
Amphetamines: NOT DETECTED
BENZODIAZEPINES: NOT DETECTED
Barbiturates: NOT DETECTED
Cocaine: NOT DETECTED
Opiates: NOT DETECTED
TETRAHYDROCANNABINOL: NOT DETECTED

## 2013-07-02 LAB — TROPONIN I
TROPONIN I: 0.3 ng/mL — AB (ref <0.30)
TROPONIN I: 0.34 ng/mL — AB (ref <0.30)
TROPONIN I: 0.31 ng/mL — AB (ref <0.30)
Troponin I: 0.33 ng/mL (ref <0.30)

## 2013-07-02 LAB — FOLATE

## 2013-07-02 LAB — CBC
HCT: 45.3 % (ref 39.0–52.0)
HEMATOCRIT: 46.2 % (ref 39.0–52.0)
HEMOGLOBIN: 15.6 g/dL (ref 13.0–17.0)
HEMOGLOBIN: 15.8 g/dL (ref 13.0–17.0)
MCH: 34.6 pg — AB (ref 26.0–34.0)
MCH: 34.3 pg — ABNORMAL HIGH (ref 26.0–34.0)
MCHC: 34.4 g/dL (ref 30.0–36.0)
MCHC: 34.2 g/dL (ref 30.0–36.0)
MCV: 100.4 fL — ABNORMAL HIGH (ref 78.0–100.0)
MCV: 100.2 fL — ABNORMAL HIGH (ref 78.0–100.0)
Platelets: 171 10*3/uL (ref 150–400)
Platelets: 188 10*3/uL (ref 150–400)
RBC: 4.51 MIL/uL (ref 4.22–5.81)
RBC: 4.61 MIL/uL (ref 4.22–5.81)
RDW: 13.7 % (ref 11.5–15.5)
RDW: 13.5 % (ref 11.5–15.5)
WBC: 12.1 10*3/uL — ABNORMAL HIGH (ref 4.0–10.5)
WBC: 10.9 10*3/uL — AB (ref 4.0–10.5)

## 2013-07-02 LAB — GLUCOSE, CAPILLARY
Glucose-Capillary: 128 mg/dL — ABNORMAL HIGH (ref 70–99)
Glucose-Capillary: 130 mg/dL — ABNORMAL HIGH (ref 70–99)
Glucose-Capillary: 210 mg/dL — ABNORMAL HIGH (ref 70–99)

## 2013-07-02 LAB — COMPREHENSIVE METABOLIC PANEL
ALK PHOS: 42 U/L (ref 39–117)
ALT: 20 U/L (ref 0–53)
AST: 22 U/L (ref 0–37)
Albumin: 3.2 g/dL — ABNORMAL LOW (ref 3.5–5.2)
BUN: 19 mg/dL (ref 6–23)
CALCIUM: 9 mg/dL (ref 8.4–10.5)
CO2: 21 meq/L (ref 19–32)
Chloride: 102 mEq/L (ref 96–112)
Creatinine, Ser: 0.97 mg/dL (ref 0.50–1.35)
GFR calc Af Amer: >90 mL/min (ref >90)
GLUCOSE: 175 mg/dL — AB (ref 70–99)
Potassium: 4 mEq/L (ref 3.7–5.3)
Sodium: 138 mEq/L (ref 137–147)
TOTAL PROTEIN: 6.3 g/dL (ref 6.0–8.3)
Total Bilirubin: 0.6 mg/dL (ref 0.3–1.2)

## 2013-07-02 LAB — HEPARIN LEVEL (UNFRACTIONATED)
HEPARIN UNFRACTIONATED: 0.27 [IU]/mL — AB (ref 0.30–0.70)
Heparin Unfractionated: 0.26 IU/mL — ABNORMAL LOW (ref 0.30–0.70)

## 2013-07-02 LAB — ETHANOL: Alcohol, Ethyl (B): <11 mg/dL (ref 0–11)

## 2013-07-02 MED ORDER — STROKE: EARLY STAGES OF RECOVERY BOOK
Freq: Once | Status: AC
  Administered 2013-07-02: 11:00:00
  Filled 2013-07-02: qty 1

## 2013-07-02 MED ORDER — SODIUM CHLORIDE 0.9 % IJ SOLN
3.0000 mL | Freq: Two times a day (BID) | INTRAMUSCULAR | Status: DC
  Administered 2013-07-02 – 2013-07-05 (×5): 3 mL via INTRAVENOUS

## 2013-07-02 MED ORDER — NICOTINE 21 MG/24HR TD PT24
21.0000 mg | MEDICATED_PATCH | Freq: Every day | TRANSDERMAL | Status: DC
  Administered 2013-07-02 – 2013-07-06 (×5): 21 mg via TRANSDERMAL
  Filled 2013-07-02 (×5): qty 1

## 2013-07-02 MED ORDER — INSULIN ASPART 100 UNIT/ML ~~LOC~~ SOLN
0.0000 [IU] | Freq: Three times a day (TID) | SUBCUTANEOUS | Status: DC
  Administered 2013-07-02 (×3): 2 [IU] via SUBCUTANEOUS
  Administered 2013-07-03: 5 [IU] via SUBCUTANEOUS
  Administered 2013-07-03 – 2013-07-04 (×3): 2 [IU] via SUBCUTANEOUS
  Administered 2013-07-04: 5 [IU] via SUBCUTANEOUS
  Administered 2013-07-04: 2 [IU] via SUBCUTANEOUS
  Administered 2013-07-05 (×2): 3 [IU] via SUBCUTANEOUS
  Administered 2013-07-06: 2 [IU] via SUBCUTANEOUS

## 2013-07-02 MED ORDER — FUROSEMIDE 40 MG PO TABS
40.0000 mg | ORAL_TABLET | Freq: Every day | ORAL | Status: DC
  Administered 2013-07-02 – 2013-07-05 (×4): 40 mg via ORAL
  Filled 2013-07-02 (×4): qty 1

## 2013-07-02 MED ORDER — HEPARIN (PORCINE) IN NACL 100-0.45 UNIT/ML-% IJ SOLN
1100.0000 [IU]/h | INTRAMUSCULAR | Status: DC
  Administered 2013-07-02: 1100 [IU]/h via INTRAVENOUS
  Filled 2013-07-02: qty 250

## 2013-07-02 MED ORDER — HEPARIN (PORCINE) IN NACL 100-0.45 UNIT/ML-% IJ SOLN
1350.0000 [IU]/h | INTRAMUSCULAR | Status: DC
  Administered 2013-07-02: 1350 [IU]/h via INTRAVENOUS
  Filled 2013-07-02: qty 250

## 2013-07-02 MED ORDER — METOPROLOL TARTRATE 12.5 MG HALF TABLET
12.5000 mg | ORAL_TABLET | Freq: Two times a day (BID) | ORAL | Status: DC
  Administered 2013-07-02 – 2013-07-04 (×2): 12.5 mg via ORAL
  Filled 2013-07-02 (×9): qty 1

## 2013-07-02 MED ORDER — ATORVASTATIN CALCIUM 40 MG PO TABS
40.0000 mg | ORAL_TABLET | Freq: Every day | ORAL | Status: DC
  Administered 2013-07-02: 40 mg via ORAL
  Filled 2013-07-02 (×2): qty 1

## 2013-07-02 NOTE — Progress Notes (Signed)
Patient ID: Joseph [name], male   DOB: Oct 03, 1965, 48 y.o.   MRN: [id number] Date of Encounter: 07/02/2013   Cardiology was not asked to consult on this patient, however when reviewing my previous rounding this, I noted the patient was readmitted to the hospital. I therefore decided to come see the patient and review the chart. It would appear that he has suffered an event most consistent with a TIA, although the MRI is still pending. His symptoms are all but resolved.  The patient is relatively well known to me from his recent hospitalization where he was diagnosed with Nonischemic Cardiomyopathy. He was evaluated an echocardiogram as well as a cardiac catheterization as noted below. He was discharged with a LifeVest portable defibrillator. Apparently shortly after arriving home, he was found by his wife slumped over in the chair with right hemiplegia. This lasted roughly 10 minutes   Principal Problem:   TIA (transient ischemic attack) Active Problems:   Recent Acute combined systolic and diastolic heart failure - ef ~-20%; (Now "Chronic") Recent d/c from Acute Hospitalization   Non-ischemic cardiomyopathy    SUBJECTIVE  Feeling well. No SOB or CP.   CURRENT MEDS . atorvastatin  40 mg Oral q1800  . furosemide  40 mg Oral Daily  . insulin aspart  0-15 Units Subcutaneous TID WC  . metoprolol tartrate  12.5 mg Oral BID  . nicotine  21 mg Transdermal Daily  . sodium chloride  3 mL Intravenous Q12H    OBJECTIVE  Filed Vitals:   07/02/13 0400 07/02/13 0941 07/02/13 1736 07/02/13 2111  BP: 101/74 94/69 98/68  100/54  Pulse: 95 100 101 106  Temp: 97.7 F (36.5 C) 98.5 F (36.9 C) 97.7 F (36.5 C) 98 F (36.7 C)  TempSrc: Oral Oral Oral Oral  Resp: 18 18 18 18   Height:      Weight:      SpO2: 93% 97% 97% 93%    Intake/Output Summary (Last 24 hours) at 07/02/13 2309 Last data filed at 07/02/13 2126  Gross per 24 hour  Intake    603 ml  Output    900 ml  Net   -297 ml    Filed Weights   07/02/13 0107  Weight: 180 lb 8 oz (81.874 kg)    PHYSICAL EXAM  General: Pleasant, NAD. AA obese male; Neuro: Alert and oriented X 3. Moves all extremities spontaneously. Psych: Normal affect. HEENT:  Normal  Neck: Supple without bruits or JVD. Lungs: CTAB Heart: RRR no s3, s4, or murmurs. Abdomen: Soft, non-tender, non-distended, BS + x 4.  Extremities: No clubbing, cyanosis or edema. DP/PT/Radials 2+ and equal bilaterally.  Accessory Clinical Findings  CBC  Recent Labs  07/01/13 2150 07/02/13 0511 07/02/13 0830  WBC 11.4* 12.1* 10.9*  NEUTROABS 5.0  --   --   HGB 15.9 15.6 15.8  HCT 46.2 45.3 46.2  MCV 100.7* 100.4* 100.2*  PLT 174 171 324   Basic Metabolic Panel  Recent Labs  07/01/13 0629 07/01/13 2150 07/02/13 0511  NA 139 137 138  K 4.1 4.2 4.0  CL 103 100 102  CO2 22 21 21   GLUCOSE 144* 172* 175*  BUN 22 25* 19  CREATININE 1.02 1.13 0.97  CALCIUM 9.2 9.4 9.0  MG  --  1.8  --    Cardiac Enzymes  Recent Labs  07/02/13 1118 07/02/13 1600 07/02/13 2152  TROPONINI 0.30* 0.34* 0.31*    Hemoglobin A1C No results found for this basename: HGBA1C,  in the last 72 hours Fasting Lipid Panel No results found for this basename: CHOL, HDL, LDLCALC, TRIG, CHOLHDL, LDLDIRECT,  in the last 72 hours Thyroid Function Tests No results found for this basename: TSH, T4TOTAL, FREET3, T3FREE, THYROIDAB,  in the last 72 hours  Not On Tele  Radiology/Studies  ECHO From recent Hospitalization: Study Conclusions--06/29/13 - Left ventricle: The cavity size was normal. There was mild concentric hypertrophy. The estimated EF was 20%. Severe global hypokinesis. The study is not technically sufficient to allow evaluation of LV diastolic function. - Mitral valve: Mildly thickened leaflets . Mild regurgitation. - Left atrium: Moderately dilated (25 cm2). - Right atrium: The atrium was at the upper limits of normal in size. - Atrial septum: No defect  or patent foramen ovale was identified. - Inferior vena cava: The vessel was normal in size; the respirophasic diameter changes were in the normal range (= 50%); findings are consistent with normal central venous pressure. - Pericardium, extracardiac: There was no pericardial effusion.  RIGHT AND LEFT HEART CARDIAC CATHETERIZATION  HISTORY:  Joseph [name] is a 48 y.o. male  PROCEDURE:  The patient was brought to the second floor [facility]iac cath lab in the postabsorptive state. Versed 2 mg and fentanyl 50 mcg were administered for conscious sedation. The right groin was prepped and draped in sterile fashion and a 5 [place] arterial sheath and 7 French venous sheath were inserted without difficulty. A Swan-Ganz catheter was advanced into the venous sheath and pressures were obtained in the right atrium, right ventricle, pulmonary artery, and pulmonary capillary wedge position. Cardiac outputs were obtained by the thermodilution and assumed Fick methods. Oxygen saturation was obtained in the pulmonary artery and aorta. A pigtail catheter was inserted and simultaneous AO/PA pressures were recorded. The pigtail catheter was advanced into the left ventricle and simultaneous left ventricular and PCW pressures were recorded. Left ventriculography was performed in the RAO projection. A left ventricle to aorta pullback was performed. The pigtail catheter was then removed and diagnostic catheterization to delineate the coronary anatomy was performed utilizing 5 French Judkins 4 left and right diagnostic catheters. All catheters were removed and the patient. Hemostasis was obtained by direct manual pressure. The patient tolerated the procedure well and returned to his room in satisfactory condition.  HEMODYNAMICS:  RA: 6 ; RV: 26/9; PA: 26/13; PCWP: a 18 mean 13; PA: 30/19;   AO: 95/74; LV: 90/25  PC: a 21 v 19 mean 17  LV: 95/15/32 ; AO: 95/74  Oxygen saturation in the aorta 96% and the pulmonary artery  64%  Cardiac output: 4.2 l/min (Thermo); 3.7 (Fick)  Cardiac index: 2.2 l/m/m2 2.0  ANGIOGRAPHY:  Left main: Angiographically normal vessel, which trifurcated into the LAD, a ramus intermediate vessel, and the left circumflex coronary artery.  LAD: Angiographically normal vessel, which gave rise to 2 proximal diagonal vessels and several septal perforating arteries. The vessel extended to and wrapped around the LV apex.  Ramus intermediate: Angiographically normal vessel.  Left circumflex: Moderate size vessel, which gave rise to 2 marginal branches. There was 30% mid AV groove stenosis.  Right coronary artery: Dominant vessel with 30% proximal narrowing and 40-50% mid narrowing. The vessel supplied PDA, PLA and inferior LV branch.  Left ventriculography reveals a dilated ventricle with severe global LV dysfunction. There was severe global hypocontractility with an ejection fraction of less than or equal to 15%.  IMPRESSION:  Severe LV dysfunction compatible with a nonischemic cardiomyopathy and an ejection fraction  of less than or equal to 15%.  Mild coronary obstructive disease with 30% narrowing in the mid AV groove circumflex, and 30% proximal and 40% mid RCA stenoses with otherwise normal LAD and ramus intermediate vessel.  RECOMMENDATION:  The patient will be started on medical therapy for a nonischemic cardiomyopathy. He will require major lifestyle modification changes with reference to his alcohol, tobacco, and cocaine use. Following medical therapy, reassessment of LV function will be necessary in several months. He may require lifevest in the interim and if LV function does not improve ICD implantation.    ASSESSMENT AND PLAN  Joseph [name] is a 48 y.o. male presenting with new onset CHF and possible COPD exacerbation vs CAP. PMH is significant for ~ 30 pack year smoking Hx, and no medical care in past 3 years. He presented on 06/28/13 with chest pain in the setting of cocaine use. He  is currently stable from a cardiomyopathy standpoint. His EF is severely reduced, with no significant evidence of CAD. He is now suffered a significant TIA episode.   Nonischemic Cardiomyopathy: Relatively compensated.  Only tolerating low-dose beta blocker from a blood pressure range.  On low-dose oral Lasix  With severely reduced EF, there is concern of possible cardioembolic source for his TIA.  TIA: MRI is pending, I happened to read Dr. Clydene Fake note, and there was mention of considering possible anticoagulation based on whether or not there was clot found on TEE.  Unfortunately, I do not see a TEE order, and did not see that cardiology have been notified of the desire for a TEE to be performed. I have left word with our weekend rounding team to check in on the patient over the weekend. -- If the TEE is desired, it is necessary to contact the cardiology service in order to have the study scheduled. At this point, it is unlikely we could arrange this for Monday, but have left word with the schedulers.  He is safe to go over his MRI without wearing the Lifevest.  Positive troponin: Non-Obs CAD on cath; I would suspect that this is likely related to his TIA since he has not had any chest tightness, pressure or CHF type symptoms..  -- Cont asa, statin and BB   HLD- continue Lipitor 40mg   DM- HgA1c 10.3 (newly diagnosed DM). Metformin should be held for 48 hours post contrast dye exposure. Can restart Sat morning.  HTN- BP soft, unable to add ACE/ARB for severe LV dysfunction at present, and with recent TIA, would avoid possible hypotension from adding medications  Tobacco abuse- nicotine patch  CAP- treated with appropriate ABx per IM  Signed,  Leonie Man, M.D., M.S. Interventional Cardiologist  [place] Pager # [phone] 07/02/2013

## 2013-07-02 NOTE — Progress Notes (Signed)
Family Medicine Teaching Service Daily Progress Note Intern Pager: [phone]  Patient name: Joseph [name] Medical record number: [id number] Date of birth: 29-Feb-1966 Age: 48 y.o. Gender: male  Primary Care Provider: No PCP Per Patient Consultants: Neurology  Code Status: Full   Pt Overview and Major Events to Date:  4/30: admitted for TIA/stroke, discharged on 4/30 with lifevest   Assessment and Plan: DIDIER BRANDENBURG is a 48 y.o. male presenting with right sided weakness . PMH is significant for CHF, DM, Tobacco abuse, Cocaine abuse   # TIA vs Stroke: patient's symptoms resolved. Patient has significant risk factors including smoking, hyperlipidemia and diabetes. Does not appear to have any deficit on exam.  CT shows no acute findings and old right frontal infarct. Patient has severe heart failure that may have attributed to current presentation.  - Neurology recommendations (greatly appreciated): Heparin anticoagulation - MRI/MRA today  - Continue atorvastatin  - Neuro checks q4hrs  - Bedside swallow eval   # HFrEF: s/p cath with EF of 15%. Home with life vest. Echo on 4/28 did not have evidence of thrombus formation, although that could have changed since the echo.  - Continue life vest  - Continue metoprolol and furosemide  # Elevated troponin: Troponin is trending down. Patient received a cath yesterday and elevation most likely to demand ischemia. EKG does not show signs of ischemia. Patient not endorsing ACS symptoms.  - Cycle troponin x3  - EKG: sinus tachycardia, left atrial enlargement and unchanged from previous   # Diabetes mellitus, type 2: last A1C of 10.3  - Will hold metformin while inpatient  - SSI moderate; CBG's TIDAC and QHS  # Tobacco abuse:  - Nicotine patch  FEN/GI: heart healthy/carb modified  Prophylaxis: Heparin  Disposition: pending improvement   Subjective: Patient feeling better since coming back into the hospital. He hasn't had any more slurred  speech. It only lasted three minutes and resolved on its own.   Objective: Temp:  [97.6 F (36.4 C)-98.4 F (36.9 C)] 97.7 F (36.5 C) (05/01 0400) Pulse Rate:  [91-108] 95 (05/01 0400) Resp:  [14-21] 18 (05/01 0400) BP: (99-118)/(58-88) 101/74 mmHg (05/01 0400) SpO2:  [93 %-99 %] 93 % (05/01 0400) Weight:  [180 lb 8 oz (81.874 kg)] 180 lb 8 oz (81.874 kg) (05/01 0107) Physical Exam: General: Laying in bed in no acute distress,  HEENT: PERRL, EOMI, moist mucous membranes  Cardiovascular: Regular rate and rhythm, no murmur  Respiratory: Clear to auscultation bilaterally, no wheezing or rales  Extremities: No edema Skin: Warm, well perfused  Neuro: Alert and oriented x 3. 5/5 upper and lower extremity strength. Cranial nerves symmetric and intact. Normal finger to nose b/l. No slurred speech and symmetric smile.   Laboratory:  Recent Labs Lab 07/01/13 0629 07/01/13 2150 07/02/13 0511  WBC 13.4* 11.4* 12.1*  HGB 15.4 15.9 15.6  HCT 45.6 46.2 45.3  PLT 186 174 171    Recent Labs Lab 06/28/13 0949  07/01/13 0629 07/01/13 2150 07/02/13 0511  NA 138  < > 139 137 138  K 4.4  < > 4.1 4.2 4.0  CL 103  < > 103 100 102  CO2 20  < > 22 21 21   BUN 12  < > 22 25* 19  CREATININE 0.82  < > 1.02 1.13 0.97  CALCIUM 8.4  < > 9.2 9.4 9.0  PROT 6.6  --   --   --  6.3  BILITOT 0.7  --   --   --  0.6  ALKPHOS 58  --   --   --  42  ALT 33  --   --   --  20  AST 23  --   --   --  22  GLUCOSE 139*  < > 144* 172* 175*  < > = values in this interval not displayed.    Recent Labs Lab 06/28/13 2045 06/29/13 0145 06/29/13 0550 07/01/13 2150 07/02/13 0511  TROPONINI <0.30 <0.30 <0.30 0.35* 0.33*    Recent Labs Lab 06/30/13 2102 07/01/13 0559 07/01/13 1053 07/01/13 1604 07/02/13 0701  GLUCAP 155* 174* 113* 107* 128*     Imaging/Diagnostic Tests: CT head: IMPRESSION: 1. No acute intracranial abnormality. 2. Remote right frontal lobe infarct. 3. Mild left sphenoid and  ethmoidal sinus disease.   Rosemarie Ax, MD 07/02/2013, 8:50 AM PGY-1, [place] Intern pager: [phone], text pages welcome

## 2013-07-02 NOTE — Progress Notes (Signed)
Stroke Team Progress Note  HISTORY Joseph [name] is a 48 y.o. male with a history of nonischemic cardiomyopathy that was recently diagnosed. His ejection fraction was found to be 20%. He was discharged from the hospital earlier today 07/02/2013. At home, he developed right-sided weakness and some difficulty understanding people. His wife found him slumped over, and at the time EMS arrival he was noted to have a right hemiparesis. Family estimates that this lasted for a little bit more than 10 minutes. He currently feels that he has completely resolved. LKW: 07/02/2013 at 8 PM Patient was not administered TPA secondary to resolved symptoms. He was admitted for further evaluation and treatment.  SUBJECTIVE His RN is at the bedside.  Overall he feels his condition is completely resolved. No complaints  OBJECTIVE Most recent Vital Signs: Filed Vitals:   07/02/13 0045 07/02/13 0107 07/02/13 0400 07/02/13 0941  BP: 104/80 112/80 101/74 94/69  Pulse: 106 94 95 100  Temp:  97.9 F (36.6 C) 97.7 F (36.5 C) 98.5 F (36.9 C)  TempSrc:  Oral Oral Oral  Resp: 14 18 18 18   Height:  5\' 5"  (1.651 m)    Weight:  81.874 kg (180 lb 8 oz)    SpO2: 93% 94% 93% 97%   CBG (last 3)   Recent Labs  07/01/13 1053 07/01/13 1604 07/02/13 0701  GLUCAP 113* 107* 128*    IV Fluid Intake:   . heparin 1,200 Units/hr (07/02/13 1047)    MEDICATIONS  . atorvastatin  40 mg Oral q1800  . furosemide  40 mg Oral Daily  . insulin aspart  0-15 Units Subcutaneous TID WC  . metoprolol tartrate  12.5 mg Oral BID  . nicotine  21 mg Transdermal Daily  . sodium chloride  3 mL Intravenous Q12H   PRN:    Diet:  Carb Control thin liquids Activity:  Bedrest DVT Prophylaxis:  Heparin drip  CLINICALLY SIGNIFICANT STUDIES Basic Metabolic Panel:  Recent Labs Lab 07/01/13 0629 07/01/13 2150 07/02/13 0511  NA 139 137 138  K 4.1 4.2 4.0  CL 103 100 102  CO2 22 21 21   GLUCOSE 144* 172* 175*  BUN 22 25* 19   CREATININE 1.02 1.13 0.97  CALCIUM 9.2 9.4 9.0  MG  --  1.8  --    Liver Function Tests:  Recent Labs Lab 06/28/13 0949 07/02/13 0511  AST 23 22  ALT 33 20  ALKPHOS 58 42  BILITOT 0.7 0.6  PROT 6.6 6.3  ALBUMIN 3.4* 3.2*   CBC:  Recent Labs Lab 06/28/13 0949  07/01/13 2150 07/02/13 0511 07/02/13 0830  WBC 8.9  < > 11.4* 12.1* 10.9*  NEUTROABS 5.6  --  5.0  --   --   HGB 14.1  < > 15.9 15.6 15.8  HCT 41.2  < > 46.2 45.3 46.2  MCV 100.0  < > 100.7* 100.4* 100.2*  PLT 200  < > 174 171 188  < > = values in this interval not displayed. Coagulation:  Recent Labs Lab 06/29/13 2032  LABPROT 12.7  INR 0.97   Cardiac Enzymes:  Recent Labs Lab 06/29/13 0550 07/01/13 2150 07/02/13 0511  TROPONINI <0.30 0.35* 0.33*   Urinalysis: No results found for this basename: COLORURINE, APPERANCEUR, LABSPEC, PHURINE, GLUCOSEU, HGBUR, BILIRUBINUR, KETONESUR, PROTEINUR, UROBILINOGEN, NITRITE, LEUKOCYTESUR,  in the last 168 hours Lipid Panel    Component Value Date/Time   CHOL 225* 06/28/2013 1701   TRIG 52 06/28/2013 1701   HDL 109 06/28/2013  1701   CHOLHDL 2.1 06/28/2013 1701   VLDL 10 06/28/2013 1701   LDLCALC 106* 06/28/2013 1701   HgbA1C  Lab Results  Component Value Date   HGBA1C 10.3* 06/28/2013    Urine Drug Screen:     Component Value Date/Time   LABOPIA NONE DETECTED 07/01/2013 2326   COCAINSCRNUR NONE DETECTED 07/01/2013 2326   LABBENZ NONE DETECTED 07/01/2013 2326   AMPHETMU NONE DETECTED 07/01/2013 2326   THCU NONE DETECTED 07/01/2013 2326   LABBARB NONE DETECTED 07/01/2013 2326    Alcohol Level:  Recent Labs Lab 06/28/13 2045  ETH <11    CT of the brain  07/01/2013    1. No acute intracranial abnormality. 2. Remote right frontal lobe infarct. 3. Mild left sphenoid and ethmoidal sinus disease.    MRI of the brain  pending  MRA of the brain  pending  2D Echocardiogram  06/29/2013 EF 20% with no source of embolus. Severe global hypokinesis.  Carotid Doppler     CXR  06/28/2013 Mild enlargement of cardiac silhouette. Suspected mild pulmonary edema though infection not entirely  Excluded.   EKG  sinus bradycardia. For complete results please see formal report.   Therapy Recommendations   Physical Exam   Obese young african [place] male not in distress.Awake alert. Afebrile. Head is nontraumatic. Neck is supple without bruit. Hearing is normal. Cardiac exam no murmur or gallop. Lungs are clear to auscultation. Distal pulses are well felt. Neurological Exam ;  Awake  Alert oriented x 3. Normal speech and language.eye movements full without nystagmus.fundi were not visualized. Vision acuity and fields appear normal. Hearing is normal. Palatal movements are normal. Face symmetric. Tongue midline. Normal strength, tone, reflexes and coordination. Normal sensation. Gait deferred. ASSESSMENT [name] is a 48 y.o. male presenting with transient right sided weakness. Possible left brain stroke vs TIA; imaging pending. Symptoms felt to be embolic secondary to known cardiomyopathy.  On aspirin 81 mg orally every day prior to admission. Now on heparin for secondary stroke prevention. Patient with resultant  No deficits. Stroke work up underway.  hypertension  Diabetes, HgbA1c 10.3, goal < 7.0 Hyperlipidemia, LDL 106, on lipitor 40 mg daily PTA, now on lipitor 40 mg daily, goal LDL < 100 (< 70 for diabetics) NICM w/ CHF, EF 25% 06/28/2013. Needs life vest and possible ICD implantation CAD - non obstructive dz, managed medically 06/28/2013  etoh use  Cocaine use  [facility] day # 1  TREATMENT/PLAN  Continue heparin for secondary stroke prevention for now. May consider long term anticoagulation if clot noted onTransesophageal Echo but overall not a good anticoagulation candidate given cocaine abuse and medical noncompliance  Complete stroke workup  If MRI positive for stroke, patient will need a carotid doppler and therapy evals    Mobilize out of bed. PT/OT   I have personally obtained a history, examined the patient, evaluated imaging results, and formulated the assessment and plan of care. I agree with the above. Antony Contras, MD   To contact Stroke Continuity provider, please refer to [email or web address] After hours, contact General Neurology

## 2013-07-02 NOTE — Progress Notes (Signed)
ANTICOAGULATION CONSULT NOTE - Follow Up Consult  Pharmacy Consult for Heparin Indication: stroke  No Known Allergies  Patient Measurements: Height: 5\' 5"  (165.1 cm) Weight: 180 lb 8 oz (81.874 kg) IBW/kg (Calculated) : 61.5 Heparin Dosing Weight: 78 kg  Labs:  Recent Labs  06/29/13 2032  07/01/13 0629  07/01/13 2150 07/02/13 0511 07/02/13 0830 07/02/13 1118 07/02/13 1600  HGB  --   < > 15.4  --  15.9 15.6 15.8  --   --   HCT  --   < > 45.6  --  46.2 45.3 46.2  --   --   PLT  --   < > 186  --  174 171 188  --   --   LABPROT 12.7  --   --   --   --   --   --   --   --   INR 0.97  --   --   --   --   --   --   --   --   HEPARINUNFRC  --   --   --   --   --   --  0.27*  --  0.26*  CREATININE  --   < > 1.02  --  1.13 0.97  --   --   --   TROPONINI  --   --   --   < > 0.35* 0.33*  --  0.30* 0.34*  < > = values in this interval not displayed.  Estimated Creatinine Clearance: 90.8 ml/min (by C-G formula based on Cr of 0.97).   Assessment:  Heparin drip increased from 1100 to 1200 units/hr this morning.  Heparin level 0.27->0.26; unchanged.    Goal of Therapy:  Heparin level 0.3-0.5 units/ml, lower end of therapeutic range Monitor platelets by anticoagulation protocol: Yes   Plan:   Increase heparin drip to 1350 units/hr.  Next heparin level and CBC in am.  Arty Baumgartner, [place] Pager: [phone] 07/02/2013,5:08 PM

## 2013-07-02 NOTE — Progress Notes (Signed)
ANTICOAGULATION CONSULT NOTE - Initial Consult  Pharmacy Consult for Heparin  Indication: stroke  No Known Allergies  Patient Measurements: 79.6 kg  Vital Signs: Temp: 98.4 F (36.9 C) (04/30 2110) Temp src: Oral (04/30 2110) BP: 104/80 mmHg (05/01 0045) Pulse Rate: 106 (05/01 0045)  Labs:  Recent Labs  06/29/13 0145 06/29/13 0550 06/29/13 0900 06/29/13 2032  06/30/13 1246 07/01/13 0629 07/01/13 2150  HGB  --   --  15.1  --   < > 15.5 15.4 15.9  HCT  --   --  45.0  --   < > 46.0 45.6 46.2  PLT  --   --  209  --   < > 193 186 174  LABPROT  --   --   --  12.7  --   --   --   --   INR  --   --   --  0.97  --   --   --   --   CREATININE  --   --  0.98  --   < > 1.12 1.02 1.13  TROPONINI <0.30 <0.30  --   --   --   --   --  0.35*  < > = values in this interval not displayed.   Medical History: Past Medical History  Diagnosis Date  . Hypertension   . Shortness of breath   . High cholesterol   . CHF (congestive heart failure)   . Pneumonia ?2004  . Type II diabetes mellitus   . CAD (coronary artery disease)     a. 06/28/13 non obst dz. managed medically  . Non-ischemic cardiomyopathy     a. 06/28/13 ECHO: EF 25% and global hypokinesis and cath wuth EF 15%     Assessment: 48 y/o M with likely embolic TIA due to cardiomyopathy, neurology recommends IV heparin with no bolus, CBC good, renal function good, other labs as above. INR 0.97 on 4/28.   Goal of Therapy:  Heparin level 0.3-0.5 units/ml Monitor platelets by anticoagulation protocol: Yes   Plan:  -Start heparin infusion at 1100 units/hr -0800 HL  -Daily CBC/HL -Monitor for bleeding  Narda Bonds 07/02/2013,1:18 AM

## 2013-07-02 NOTE — Progress Notes (Signed)
ANTICOAGULATION CONSULT NOTE - Follow Up Consult  Pharmacy Consult:  Heparin Indication:  CVA  No Known Allergies  Patient Measurements: Height: 5\' 5"  (165.1 cm) Weight: 180 lb 8 oz (81.874 kg) IBW/kg (Calculated) : 61.5 Heparin Dosing Weight: 78 kg  Vital Signs: Temp: 98.5 F (36.9 C) (05/01 0941) Temp src: Oral (05/01 0941) BP: 94/69 mmHg (05/01 0941) Pulse Rate: 100 (05/01 0941)  Labs:  Recent Labs  06/29/13 2032  07/01/13 0629 07/01/13 2150 07/02/13 0511 07/02/13 0830  HGB  --   < > 15.4 15.9 15.6 15.8  HCT  --   < > 45.6 46.2 45.3 46.2  PLT  --   < > 186 174 171 188  LABPROT 12.7  --   --   --   --   --   INR 0.97  --   --   --   --   --   HEPARINUNFRC  --   --   --   --   --  0.27*  CREATININE  --   < > 1.02 1.13 0.97  --   TROPONINI  --   --   --  0.35* 0.33*  --   < > = values in this interval not displayed.  Estimated Creatinine Clearance: 90.8 ml/min (by C-G formula based on Cr of 0.97).     Assessment: 89 YOF with embolic TIA to continue on IV heparin.  Heparin level slightly sub-therapeutic.  No bleeding reported.   Goal of Therapy:  Heparin level 0.3-0.5 units/ml Monitor platelets by anticoagulation protocol: Yes    Plan:  - Increase heparin gtt to 1200 units/hr - Check 6 hr HL - Daily HL / CBC - F/U initiation of ACEi/ARB therapy    Joseph [name] D. Mina Marble, PharmD, BCPS Pager:  [phone] 07/02/2013, 10:00 AM

## 2013-07-02 NOTE — H&P (Signed)
FMTS ATTENDING ADMISSION NOTE Linzey Ramser,MD I  have seen and examined this patient, reviewed their chart. I have discussed this patient with the Dr Lindell Noe who is the admitting attending and he also had discussion with the admitting resident. I agree with the resident's findings, assessment and care plan.  48 Y/O M recently d/c from the hospital for newly diagnosed CHF with EF of 15% and newly diagnosed DM2, patient stated last night he developed sudden weakness of his right upper extremity as well as confusion,he denies LOC,no fall, he denies slurring of speech,but he felt even though he was talking,it does not seem his spouse could make any sense out of what he was saying. He denies any headache,no SOB,no chest pain.  Filed Vitals:   07/02/13 0045 07/02/13 0107 07/02/13 0400 07/02/13 0941  BP: 104/80 112/80 101/74 94/69  Pulse: 106 94 95 100  Temp:  97.9 F (36.6 C) 97.7 F (36.5 C) 98.5 F (36.9 C)  TempSrc:  Oral Oral Oral  Resp: 14 18 18 18   Height:  5\' 5"  (1.651 m)    Weight:  180 lb 8 oz (81.874 kg)    SpO2: 93% 94% 93% 97%   Exam: Gen: Calm in bed,not in distress. HEENT: No facial asymmetry,EOMI,PERRLA. Neuro: Awake and alert, oriented x 3. DTR intact,no sensory loss, motor and strength intact. Gait not assessed. Resp: Air entry equal and clear B/L. CV: S1 S2 normal,no murmur. Abd: Soft,NT/ND, BS+ and normal. Ext: No edema.  A/P: 48 Y/O M with. 1. Right upper limb weakness which has now resolved. TIA need stroke work up.     Neuro consulted.     Neuro check recommended as well as fall precaution.     MRI/MRA, carotid doppler.     PT/OP.     Recently had A1C,TSH with recent admission as well as ECHO.     Neuro to follow up today.  2. Nonischemic Congestive heart failure.     S/P hospital discharge,full work up completed with cardiology evaluation.     No changes or deterioration, seem stable from cardiac stand point.     On Metoprolol and Lasix as well as live vest  defibrillator.     Plan outpatient cardiac follow up unless there is a change in status.     Monitor on telemetry.  3. DM: Metformin held.     On SSI.     Monitor CBG.  4. Troponin elevated: Patient asymptomatic.     EKG not suggestive of ischemic event.     Patient had left heart catheterization yesterday,this could have caused to irritation.    Cycle troponin, if continue to be elevated will consult cardiologist again.  Disposition: Complete stroke work up per protocol. Monitor cardiac activities on telemetry and f/u troponin.

## 2013-07-02 NOTE — Progress Notes (Signed)
FMTS ATTENDING  NOTE Joseph Keysor,MD I  have seen and examined this patient, reviewed their chart. I have discussed this patient with the resident. I agree with the resident's findings, assessment and care plan. 

## 2013-07-02 NOTE — Progress Notes (Signed)
Patient was discharged 07/01/2013 and readmitted the same day; Complete CM consult done by previous CM - see below  CARE MANAGEMENT NOTE 07/02/2013  Patient:  Joseph [name], Joseph [name]   Account Number:  [id number]  Date Initiated:  07/01/2013  Documentation initiated by:  [facility]  Subjective/Objective Assessment:   48 y.o. male presenting with new onset CHF and possible COPD exacerbation vs CAP. PMH is significant for   30 pack year smoking Hx, and no medical care in past 3 years.  // Home with girl friend     Action/Plan:   Plan to admit to tele, ECHO, diuresis, TSH.// Medication assistance   Anticipated DC Date:  07/02/2013   Anticipated DC Plan:  HOME/SELF CARE  In-house referral  Development worker, community  PCP / [place]  CM consult  [place] [facility]  [facility]  Follow-up appt scheduled  Medication Assistance         Status of service:  Completed, signed off Medicare Important Message given?  NO (If response is "NO", the following Medicare IM given date fields will be blank)  Comments:  07/01/13 [place], RN, BSN, [place] [phone] Chart reviewed.  Spoke with pt concerning his medications ans able to afford them.  Explained the [place]- Medication Assistance program to pt. Pt understand that the [facility] program is only a one time in one year from the date of discharge to next year. Pt also understand that there is a $3.00 co pay for each prescription. Will wait until discharge to  apply for  [facility] program for pt.

## 2013-07-02 NOTE — Evaluation (Signed)
Clinical/Bedside Swallow Evaluation Patient Details  Name: Joseph [name] MRN: [id number] Date of Birth: January 12, 1966  Today's Date: 07/02/2013 Time: 4481-8563 SLP Time Calculation (min): 14 min  Past Medical History:  Past Medical History  Diagnosis Date  . Hypertension   . Shortness of breath   . High cholesterol   . CHF (congestive heart failure)   . Pneumonia ?2004  . Type II diabetes mellitus   . CAD (coronary artery disease)     a. 06/28/13 non obst dz. managed medically  . Non-ischemic cardiomyopathy     a. 06/28/13 ECHO: EF 25% and global hypokinesis and cath wuth EF 15%    Past Surgical History:  Past Surgical History  Procedure Laterality Date  . Finger amputation Right 1987    "reattached middle and ring fingers"   HPI:  Joseph [name] is a 48 y.o. male with a history of nonischemic cardiomyopathy that was recently diagnosed. His ejection fraction was found to be 20%. He was discharged from the hospital 07/01/12 and at home, he developed right-sided weakness and some difficulty understanding people. His wife found him slumped over, and at the time EMS arrival he was noted to have a right hemiparesis. Family estimates that this lasted for a little bit more than 10 minutes. He currently feels that he has completely resolved. CT negative for acute changes, MRI pending.   Assessment / Plan / Recommendation Clinical Impression  Pt presents with no overt s/s of aspiration across PO trials, and with no overt evidence of dysphagia. Oropharyngeal swallow appears timely and with adequate hyolaryngeal movement upon palpation. Given the above as well as overall resolution of symptoms, no further SLP f/u for swallowing is recommended at this time.    Aspiration Risk  Mild    Diet Recommendation Regular;Thin liquid   Liquid Administration via: Cup;Straw Medication Administration: Whole meds with liquid Supervision: Patient able to self feed Compensations: Slow rate;Small  sips/bites Postural Changes and/or Swallow Maneuvers: Seated upright 90 degrees    Other  Recommendations Oral Care Recommendations: Oral care BID   Follow Up Recommendations  None    Frequency and Duration        Pertinent Vitals/Pain N/A    SLP Swallow Goals     Swallow Study Prior Functional Status       General Date of Onset: 07/01/13 HPI: Joseph [name] is a 48 y.o. male with a history of nonischemic cardiomyopathy that was recently diagnosed. His ejection fraction was found to be 20%. He was discharged from the hospital 07/01/12 and at home, he developed right-sided weakness and some difficulty understanding people. His wife found him slumped over, and at the time EMS arrival he was noted to have a right hemiparesis. Family estimates that this lasted for a little bit more than 10 minutes. He currently feels that he has completely resolved. CT negative for acute changes, MRI pending. Type of Study: Bedside swallow evaluation Previous Swallow Assessment: none in chart Diet Prior to this Study: Regular;Thin liquids Temperature Spikes Noted: No Respiratory Status: Room air History of Recent Intubation: No Behavior/Cognition: Alert;Cooperative;Pleasant mood Self-Feeding Abilities: Able to feed self Patient Positioning: Upright in bed Baseline Vocal Quality: Clear Volitional Cough: Strong Volitional Swallow: Able to elicit    Oral/Motor/Sensory Function Overall Oral Motor/Sensory Function: Appears within functional limits for tasks assessed   Ice Chips Ice chips: Not tested   Thin Liquid Thin Liquid: Within functional limits Presentation: Cup;Self Fed;Straw    Nectar Thick Nectar Thick Liquid: Not tested  Honey Thick Honey Thick Liquid: Not tested   Puree Puree: Within functional limits Presentation: Spoon;Self Fed   Solid   GO    Solid: Within functional limits Presentation: Self Fed        Germain Osgood, M.A. CCC-SLP [phone]  Germain Osgood 07/02/2013,3:15 PM

## 2013-07-02 NOTE — Consult Note (Addendum)
Neurology Consultation Reason for Consult: Right-sided weakness, resolved Referring Physician: Alyson Locket.  CC: Right-sided weakness  History is obtained from: Patient, family  HPI: Joseph [name] is a 48 y.o. male with a history of nonischemic cardiomyopathy that was recently diagnosed. His ejection fraction was found to be 20%. He was discharged from the hospital earlier today. At home, he developed right-sided weakness and some difficulty understanding people. His wife found him slumped over, and at the time EMS arrival he was noted to have a right hemiparesis. Family estimates that this lasted for a little bit more than 10 minutes.  He currently feels that he has completely resolved.   LKW: 8 PM tpa given?: no, resolved symptoms    ROS: A 14 point ROS was performed and is negative except as noted in the HPI.  Past Medical History  Diagnosis Date  . Hypertension   . Shortness of breath   . High cholesterol   . CHF (congestive heart failure)   . Pneumonia ?2004  . Type II diabetes mellitus   . CAD (coronary artery disease)     a. 06/28/13 non obst dz. managed medically  . Non-ischemic cardiomyopathy     a. 06/28/13 ECHO: EF 25% and global hypokinesis and cath wuth EF 15%     Family History: No history of stroke  Social History: Tob: Current smoker  Exam: Current vital signs: BP 112/77  Pulse 94  Temp(Src) 98.4 F (36.9 C) (Oral)  Resp 18  SpO2 96% Vital signs in last 24 hours: Temp:  [97.6 F (36.4 C)-98.4 F (36.9 C)] 98.4 F (36.9 C) (04/30 2110) Pulse Rate:  [91-101] 94 (04/30 2345) Resp:  [16-21] 18 (04/30 2345) BP: (92-118)/(58-84) 112/77 mmHg (04/30 2345) SpO2:  [95 %-99 %] 96 % (04/30 2345) Weight:  [79.606 kg (175 lb 8 oz)] 79.606 kg (175 lb 8 oz) (04/30 0503)  General: In bed, NAD CV: Regular rate and rhythm Mental Status: Patient is awake, alert, oriented to person, place, month, year, and situation. Immediate and remote memory are  intact. Patient is able to give a clear and coherent history. No signs of aphasia or neglect Cranial Nerves: II: Visual Fields are full. Pupils are equal, round, and reactive to light.  Discs are difficult to visualize he II,IV, VI: EOMI without ptosis or diploplia.  V: Facial sensation is symmetric to temperature and light touch, but mildly reduced to pinprick on the right VII: Facial movement is symmetric.  VIII: hearing is intact to voice X: Uvula elevates symmetrically XI: Shoulder shrug is symmetric. XII: tongue is midline without atrophy or fasciculations.  Motor: Tone is normal. Bulk is normal. 5/5 strength was present in all four extremities.  Sensory: Sensation is symmetric to light touch and temperature in the arms and legs. But mildly reduced to pinprick on the right Deep Tendon Reflexes: 2+ and symmetric in the biceps and patellae.  Cerebellar: FNF and HKS are intact bilaterally Gait: Not assessed due to multiple medical monitors in ED setting.   I have reviewed labs in epic and the results pertinent to this consultation are: BMP - elevated glucose  I have reviewed the images obtained: CT head - old right frontal infarct.   Impression:  48 year old male with cardiomyopathy and at least one previous embolic appearing event on CT. This event was very likely an embolic TIA due to his cardiomyopathy. If there is ischemia, it is likely very small. I do think that he merits anticoagulation. Until the MRI is  obtained, would favor using IV heparin with no bolus..  Recommendations: 1) Stroke protocol heparin 2) LDL 3) MRI brain/MRA head 4) stroke team to follow.   Roland Rack, MD [facility] [phone]  If 7pm- 7am, please page neurology on call as listed in [place].

## 2013-07-03 ENCOUNTER — Inpatient Hospital Stay (HOSPITAL_COMMUNITY): Payer: Medicaid Other

## 2013-07-03 LAB — LIPID PANEL
CHOLESTEROL: 176 mg/dL (ref 0–200)
HDL: 69 mg/dL (ref >39)
LDL CALC: 80 mg/dL (ref 0–99)
Total CHOL/HDL Ratio: 2.6 RATIO
Triglycerides: 137 mg/dL (ref <150)
VLDL: 27 mg/dL (ref 0–40)

## 2013-07-03 LAB — CBC
HEMATOCRIT: 46.1 % (ref 39.0–52.0)
Hemoglobin: 15.8 g/dL (ref 13.0–17.0)
MCH: 34.3 pg — AB (ref 26.0–34.0)
MCHC: 34.3 g/dL (ref 30.0–36.0)
MCV: 100.2 fL — AB (ref 78.0–100.0)
PLATELETS: 185 10*3/uL (ref 150–400)
RBC: 4.6 MIL/uL (ref 4.22–5.81)
RDW: 13.5 % (ref 11.5–15.5)
WBC: 12.3 10*3/uL — ABNORMAL HIGH (ref 4.0–10.5)

## 2013-07-03 LAB — TROPONIN I

## 2013-07-03 LAB — HEPARIN LEVEL (UNFRACTIONATED)
Heparin Unfractionated: 0.49 IU/mL (ref 0.30–0.70)
Heparin Unfractionated: 0.64 IU/mL (ref 0.30–0.70)

## 2013-07-03 LAB — GLUCOSE, CAPILLARY
Glucose-Capillary: 146 mg/dL — ABNORMAL HIGH (ref 70–99)
Glucose-Capillary: 140 mg/dL — ABNORMAL HIGH (ref 70–99)
Glucose-Capillary: 224 mg/dL — ABNORMAL HIGH (ref 70–99)
Glucose-Capillary: 119 mg/dL — ABNORMAL HIGH (ref 70–99)

## 2013-07-03 MED ORDER — ATORVASTATIN CALCIUM 80 MG PO TABS
80.0000 mg | ORAL_TABLET | Freq: Every day | ORAL | Status: DC
  Administered 2013-07-03 – 2013-07-05 (×3): 80 mg via ORAL
  Filled 2013-07-03 (×4): qty 1

## 2013-07-03 MED ORDER — HEPARIN (PORCINE) IN NACL 100-0.45 UNIT/ML-% IJ SOLN
1150.0000 [IU]/h | INTRAMUSCULAR | Status: DC
  Administered 2013-07-03: 1250 [IU]/h via INTRAVENOUS
  Filled 2013-07-03: qty 250

## 2013-07-03 MED ORDER — LIVING BETTER WITH HEART FAILURE BOOK
Freq: Once | Status: AC
  Administered 2013-07-03: 07:00:00
  Filled 2013-07-03: qty 1

## 2013-07-03 MED ORDER — LIVING WELL WITH DIABETES BOOK
Freq: Once | Status: DC
  Filled 2013-07-03: qty 1

## 2013-07-03 NOTE — Progress Notes (Signed)
FMTS Attending Note  I personally saw and evaluated the patient. The plan of care was discussed with the resident team. I agree with the assessment and plan as documented by the resident.   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 Neurology following and recommending TEE and carotid dopplers to evaluate for embolic source. Patient is clinically improved from time of admission. Continue Lipitor and Heparin gtt per Neurology recs.   Dossie Arbour MD

## 2013-07-03 NOTE — Progress Notes (Signed)
Pt hypotensive. No neuro or cardiac symptoms. Notified M. Donnal Debar and Mike Craze. No new orders given. Will continue to monitor pt and blood pressure.

## 2013-07-03 NOTE — Progress Notes (Signed)
Pt transported off to MRI. Francis Gaines Nathali Vent RN.

## 2013-07-03 NOTE — Progress Notes (Signed)
Stroke Team Progress Note  HISTORY DARRILL VREELAND is a 48 y.o. male with a history of nonischemic cardiomyopathy that was recently diagnosed. His ejection fraction was found to be 20%. He was discharged from the hospital earlier today 07/02/2013. At home, he developed right-sided weakness and some difficulty understanding people. His wife found him slumped over, and at the time EMS arrival he was noted to have a right hemiparesis. Family estimates that this lasted for a little bit more than 10 minutes. He currently feels that he has completely resolved. LKW: 07/02/2013 at 8 PM Patient was not administered TPA secondary to resolved symptoms. He was admitted for further evaluation and treatment.  SUBJECTIVE No overnight events. Had MRI completed.   OBJECTIVE Most recent Vital Signs: Filed Vitals:   07/03/13 0120 07/03/13 0131 07/03/13 0612 07/03/13 1018  BP: 93/66 84/57 100/67 92/48  Pulse: 90 88 75 103  Temp:  98 F (36.7 C) 97.4 F (36.3 C) 98 F (36.7 C)  TempSrc:  Oral Oral Oral  Resp:  18 16 16   Height:      Weight:      SpO2:  96% 97% 98%   CBG (last 3)   Recent Labs  07/02/13 2115 07/03/13 0653 07/03/13 1100  GLUCAP 210* 146* 140*    IV Fluid Intake:   . heparin 1,350 Units/hr (07/02/13 2155)    MEDICATIONS  . atorvastatin  40 mg Oral q1800  . furosemide  40 mg Oral Daily  . insulin aspart  0-15 Units Subcutaneous TID WC  . living well with diabetes book   Does not apply Once  . metoprolol tartrate  12.5 mg Oral BID  . nicotine  21 mg Transdermal Daily  . sodium chloride  3 mL Intravenous Q12H   PRN:    Diet:  Carb Control thin liquids Activity:  Bedrest DVT Prophylaxis:  Heparin drip  CLINICALLY SIGNIFICANT STUDIES Basic Metabolic Panel:   Recent Labs Lab 07/01/13 0629 07/01/13 2150 07/02/13 0511  NA 139 137 138  K 4.1 4.2 4.0  CL 103 100 102  CO2 22 21 21   GLUCOSE 144* 172* 175*  BUN 22 25* 19  CREATININE 1.02 1.13 0.97  CALCIUM 9.2 9.4 9.0  MG   --  1.8  --    Liver Function Tests:   Recent Labs Lab 06/28/13 0949 07/02/13 0511  AST 23 22  ALT 33 20  ALKPHOS 58 42  BILITOT 0.7 0.6  PROT 6.6 6.3  ALBUMIN 3.4* 3.2*   CBC:  Recent Labs Lab 06/28/13 0949  07/01/13 2150  07/02/13 0830 07/03/13 0430  WBC 8.9  < > 11.4*  < > 10.9* 12.3*  NEUTROABS 5.6  --  5.0  --   --   --   HGB 14.1  < > 15.9  < > 15.8 15.8  HCT 41.2  < > 46.2  < > 46.2 46.1  MCV 100.0  < > 100.7*  < > 100.2* 100.2*  PLT 200  < > 174  < > 188 185  < > = values in this interval not displayed. Coagulation:   Recent Labs Lab 06/29/13 2032  LABPROT 12.7  INR 0.97   Cardiac Enzymes:   Recent Labs Lab 07/02/13 1600 07/02/13 2152 07/03/13 0430  TROPONINI 0.34* 0.31* <0.30   Urinalysis: No results found for this basename: COLORURINE, APPERANCEUR, LABSPEC, PHURINE, GLUCOSEU, HGBUR, BILIRUBINUR, KETONESUR, PROTEINUR, UROBILINOGEN, NITRITE, LEUKOCYTESUR,  in the last 168 hours Lipid Panel    Component Value  Date/Time   CHOL 176 07/03/2013 0430   TRIG 137 07/03/2013 0430   HDL 69 07/03/2013 0430   CHOLHDL 2.6 07/03/2013 0430   VLDL 27 07/03/2013 0430   LDLCALC 80 07/03/2013 0430   HgbA1C  Lab Results  Component Value Date   HGBA1C 10.3* 06/28/2013    Urine Drug Screen:     Component Value Date/Time   LABOPIA NONE DETECTED 07/01/2013 2326   COCAINSCRNUR NONE DETECTED 07/01/2013 2326   LABBENZ NONE DETECTED 07/01/2013 2326   AMPHETMU NONE DETECTED 07/01/2013 2326   THCU NONE DETECTED 07/01/2013 2326   LABBARB NONE DETECTED 07/01/2013 2326    Alcohol Level:   Recent Labs Lab 06/28/13 2045 07/02/13 1118  ETH <11 <11    CT of the brain  07/01/2013    1. No acute intracranial abnormality. 2. Remote right frontal lobe infarct. 3. Mild left sphenoid and ethmoidal sinus disease.    MRI of the brain  pending  MRA of the brain  pending  2D Echocardiogram  06/29/2013 EF 20% with no source of embolus. Severe global hypokinesis.  Carotid Doppler    CXR   06/28/2013 Mild enlargement of cardiac silhouette. Suspected mild pulmonary edema though infection not entirely  Excluded.   EKG  sinus bradycardia. For complete results please see formal report.   Therapy Recommendations   Physical Exam   Obese young african [place] male not in distress.Awake alert. Afebrile. Head is nontraumatic. Neck is supple without bruit. Hearing is normal. Cardiac exam no murmur or gallop. Lungs are clear to auscultation. Distal pulses are well felt. Neurological Exam ;  Awake  Alert oriented x 3. Normal speech and language.eye movements full without nystagmus.fundi were not visualized. Vision acuity and fields appear normal. Hearing is normal. Palatal movements are normal. Face symmetric. Tongue midline. Normal strength, tone, reflexes and coordination. Normal sensation. Gait deferred. ASSESSMENT Mr. RENO CLASBY is a 48 y.o. male presenting with transient right sided weakness. MRI completed showing multifocal areas of acute infarction with largest in left frontal opercular cortex. Symptoms felt to be embolic secondary to known cardiomyopathy.  On aspirin 81 mg orally every day prior to admission. Now on heparin for secondary stroke prevention. Patient with resultant  no deficits. Stroke work up underway.  hypertension  Diabetes, HgbA1c 10.3, goal < 7.0 Hyperlipidemia, LDL 106, on lipitor 40 mg daily PTA, now on lipitor 40 mg daily, goal LDL < 100 (< 70 for diabetics) NICM w/ CHF, EF 25% 06/28/2013. Needs life vest and possible ICD implantation CAD - non obstructive dz, managed medically 06/28/2013  etoh use  Cocaine use  Tobacco ue   Hospital day # 2  TREATMENT/PLAN  Continue heparin for secondary stroke prevention for now. Suggest TEE. May consider long term anticoagulation if clot noted on TEE but overall not a good anticoagulation candidate given cocaine abuse and medical  noncompliance  Complete stroke workup  Check carotid doppler  Mobilize out of  bed. PT/OT   Jim Like, DO Neurology-Stroke  To contact Stroke Continuity provider, please refer to [email or web address] After hours, contact General Neurology

## 2013-07-03 NOTE — Progress Notes (Signed)
Physical Therapy Evaluation Patient Details Name: Joseph [name] MRN: [id number] DOB: 1965/03/28 Today's Date: 07/03/2013   History of Present Illness  Joseph [name] is a 48 y.o. male with a history of nonischemic cardiomyopathy that was recently diagnosed. His ejection fraction was found to be 20%. He was discharged from the hospital earlier today 07/02/2013. At home, he developed right-sided weakness and some difficulty understanding people. His wife found him slumped over, and at the time EMS arrival he was noted to have a right hemiparesis. Family estimates that this lasted for a little bit more than 10 minutes. He currently feels that he has completely resolved.  Admitted for TIA/CVA work-up.  Clinical Impression  Patient is independent with all mobility and gait.  Good balance with high level balance activities.  No acute PT needs identified - PT will sign off.  Encouraged ambulation in hallway with nursing.    Follow Up Recommendations No PT follow up;Supervision - Intermittent    Equipment Recommendations  None recommended by PT    Recommendations for Other Services       Precautions / Restrictions Precautions Precautions: None Precaution Comments: Has Life Vest. Restrictions Weight Bearing Restrictions: No      Mobility  Bed Mobility Overal bed mobility: Independent                Transfers Overall transfer level: Independent Equipment used: None                Ambulation/Gait Ambulation/Gait assistance: Independent Ambulation Distance (Feet): 200 Feet Assistive device: None Gait Pattern/deviations: WFL(Within Functional Limits)   Gait velocity interpretation: at or above normal speed for age/gender General Gait Details: Patient demonstrates good gait pattern, balance, and speed  Stairs            Wheelchair Mobility    Modified Rankin (Stroke Patients Only) Modified Rankin (Stroke Patients Only) Pre-Morbid Rankin Score: No  symptoms Modified Rankin: No symptoms     Balance Overall balance assessment: Independent                           High level balance activites: Turns;Direction changes;Sudden stops;Head turns High Level Balance Comments: No loss of balance with high level balance activities             Pertinent Vitals/Pain     Home Living Family/patient expects to be discharged to:: Private residence Living Arrangements: Spouse/significant other Available Help at Discharge: Family;Available 24 hours/day (Girlfriend) Type of Home: House Home Access: Stairs to enter Entrance Stairs-Rails: None Entrance Stairs-Number of Steps: 3 Home Layout: One level Home Equipment: None      Prior Function Level of Independence: Independent         Comments: Drives, Works full time as Physiological scientist        Extremity/Trunk Assessment   Upper Extremity Assessment: Overall WFL for tasks assessed           Lower Extremity Assessment: Overall WFL for tasks assessed      Cervical / Trunk Assessment: Normal  Communication   Communication: No difficulties  Cognition Arousal/Alertness: Awake/alert Behavior During Therapy: WFL for tasks assessed/performed Overall Cognitive Status: Within Functional Limits for tasks assessed (Difficulty describing situation)                      General Comments      Exercises        Assessment/Plan  PT Assessment Patent does not need any further PT services  PT Diagnosis     PT Problem List    PT Treatment Interventions     PT Goals (Current goals can be found in the Care Plan section) Acute Rehab PT Goals PT Goal Formulation: No goals set, d/c therapy    Frequency     Barriers to discharge        Co-evaluation               End of Session   Activity Tolerance: Patient tolerated treatment well Patient left: in bed;with call bell/phone within reach Nurse Communication: Mobility status  (PT will sign off.  Encouraged ambulation with nursing)         Time: 5110-2111 PT Time Calculation (min): 20 min   Charges:   PT Evaluation $Initial PT Evaluation Tier I: 1 Procedure PT Treatments $Gait Training: 8-22 mins   PT G Codes:          Despina Pole 07-26-2013, 3:59 PM Carita Pian. Sanjuana Kava, [place] Pager [phone]

## 2013-07-03 NOTE — Progress Notes (Addendum)
ANTICOAGULATION CONSULT NOTE - Follow Up Consult  Pharmacy Consult:  Heparin Indication:  CVA  No Known Allergies  Patient Measurements: Height: 5\' 5"  (165.1 cm) Weight: 180 lb 8 oz (81.874 kg) IBW/kg (Calculated) : 61.5 Heparin Dosing Weight: 78 kg  Vital Signs: Temp: 98 F (36.7 C) (05/02 1018) Temp src: Oral (05/02 1018) BP: 92/48 mmHg (05/02 1018) Pulse Rate: 103 (05/02 1018)  Labs:  Recent Labs  07/01/13 0629  07/01/13 2150 07/02/13 0511 07/02/13 0830  07/02/13 1600 07/02/13 2152 07/03/13 0430  HGB 15.4  --  15.9 15.6 15.8  --   --   --  15.8  HCT 45.6  --  46.2 45.3 46.2  --   --   --  46.1  PLT 186  --  174 171 188  --   --   --  185  HEPARINUNFRC  --   --   --   --  0.27*  --  0.26*  --  0.49  CREATININE 1.02  --  1.13 0.97  --   --   --   --   --   TROPONINI  --   < > 0.35* 0.33*  --   < > 0.34* 0.31* <0.30  < > = values in this interval not displayed.  Estimated Creatinine Clearance: 90.8 ml/min (by C-G formula based on Cr of 0.97).     Assessment: 82 YOF with embolic TIA to continue on IV heparin.  Heparin level at goal this AM.  CBC and Pltc stable.  No bleeding reported.  Goal of Therapy:  Heparin level 0.3-0.5 units/ml Monitor platelets by anticoagulation protocol: Yes   Plan:  - Continue IV heparin at 1350 units/hr. - Repeat heparin level now to confirm. - Daily HL / CBC - F/U initiation of ACEi/ARB therapy  Uvaldo Rising, BCPS  Clinical Pharmacist Pager [phone]  07/03/2013 2:18 PM    Addendum: Confirmatory heparin level slightly above goal of 0.3-0.5.  Will decrease heparin rate to 1250 units/hr.  Recheck level in 6 hrs.  Uvaldo Rising, BCPS  Clinical Pharmacist Pager [phone]  07/03/2013 4:04 PM

## 2013-07-03 NOTE — Progress Notes (Signed)
    Primary cardiologist: Dr. Glenetta Hew  Subjective:   No chest pain or palpitations.   Objective:   Temp:  [97.4 F (36.3 C)-98.5 F (36.9 C)] 97.4 F (36.3 C) (05/02 0612) Pulse Rate:  [75-106] 75 (05/02 0612) Resp:  [16-18] 16 (05/02 0612) BP: (84-100)/(54-69) 100/67 mmHg (05/02 0612) SpO2:  [93 %-97 %] 97 % (05/02 0612) Last BM Date: 07/01/13  Filed Weights   07/02/13 0107  Weight: 180 lb 8 oz (81.874 kg)    Intake/Output Summary (Last 24 hours) at 07/03/13 0841 Last data filed at 07/02/13 2126  Gross per 24 hour  Intake    603 ml  Output    900 ml  Net   -297 ml    Telemetry: Sinus rhythm.  Exam:  General: Appears comfortable.  Lungs: Clear, nonlabored.  Cardiac: RRR, no gallop.  Extremities: No pitting.   Lab Results:  Basic Metabolic Panel:  Recent Labs Lab 07/01/13 0629 07/01/13 2150 07/02/13 0511  NA 139 137 138  K 4.1 4.2 4.0  CL 103 100 102  CO2 22 21 21   GLUCOSE 144* 172* 175*  BUN 22 25* 19  CREATININE 1.02 1.13 0.97  CALCIUM 9.2 9.4 9.0  MG  --  1.8  --     Liver Function Tests:  Recent Labs Lab 06/28/13 0949 07/02/13 0511  AST 23 22  ALT 33 20  ALKPHOS 58 42  BILITOT 0.7 0.6  PROT 6.6 6.3  ALBUMIN 3.4* 3.2*    CBC:  Recent Labs Lab 07/02/13 0511 07/02/13 0830 07/03/13 0430  WBC 12.1* 10.9* 12.3*  HGB 15.6 15.8 15.8  HCT 45.3 46.2 46.1  MCV 100.4* 100.2* 100.2*  PLT 171 188 185    Cardiac Enzymes:  Recent Labs Lab 07/02/13 1600 07/02/13 2152 07/03/13 0430  TROPONINI 0.34* 0.31* <0.30    BNP:  Recent Labs  06/28/13 0949  PROBNP 955.8*    Coagulation:  Recent Labs Lab 06/29/13 2032  INR 0.97    Medications:   Scheduled Medications: . atorvastatin  40 mg Oral q1800  . furosemide  40 mg Oral Daily  . insulin aspart  0-15 Units Subcutaneous TID WC  . living well with diabetes book   Does not apply Once  . metoprolol tartrate  12.5 mg Oral BID  . nicotine  21 mg Transdermal Daily    . sodium chloride  3 mL Intravenous Q12H     Infusions: . heparin 1,350 Units/hr (07/02/13 2155)      Assessment:   1. Probable TIA, head CT without acute stroke (old event noted). MRI pending.  2. NICM, LVEF 15-20%, diffuse hypokinesis. Mild nonobstructive CAD by recent cardiac catheterization. Has had LifeVest in place.  3. Type 2 diabetes mellitus, recently diagnosed, HgbA1C 10.3.  4. Tobacco abuse.  5. Hyperlipidemia, on statin.   Plan/Discussion:    Currently on low dose Lopressor, Lasix, and Lipitor. No ACE-I or ARB with low to normal BP. Further workup ongoing per primary team with head MRI pending (can take off LifeVest for MRI). If TEE requested please inform our service so that this can be scheduled next week.   Satira Sark, M.D., F.A.C.C.

## 2013-07-03 NOTE — Progress Notes (Signed)
Family Medicine Teaching Service Daily Progress Note Intern Pager: [phone]  Patient name: Joseph [name] Medical record number: [id number] Date of birth: Nov 16, 1965 Age: 48 y.o. Gender: male  Primary Care Provider: No PCP Per Patient Consultants: Neurology  Code Status: Full   Pt Overview and Major Events to Date:  4/30: admitted for TIA/stroke, discharged on 4/30 with lifevest   Assessment and Plan: Joseph [name] is a 48 y.o. male presenting with right sided weakness . PMH is significant for CHF, DM, Tobacco abuse, Cocaine abuse   # TIA vs Stroke: patient's symptoms resolved. Patient has significant risk factors including smoking, hyperlipidemia and diabetes. Does not appear to have any deficit on exam.  CT shows no acute findings and old right frontal infarct. Patient has severe heart failure that may have attributed to current presentation.  - Neurology recommendations (greatly appreciated): Heparin anticoagulation - MRI 5/2 suggestive of multifocal acute infarct - Previously on atorvastatin 40mg  daily, will increase to lipitor 80mg  daily for secondary prevention, consider crestor 40mg  upon discharge (not on hospital formulary) - SLP eval - no follow up necessary - OT/PT ordered today - needs TEE, possibly chronic anticoagulation (? poor candidate due to cocaine abuse) - also needs carotid doppler  # HFrEF: s/p cath with EF of 15%. Home with life vest. Echo on 4/28 did not have evidence of thrombus formation, although that could have changed since the echo.  - Continue life vest - Continue metoprolol and furosemide - cardiology following, appreciate cardiology recommendations  # Elevated troponin: Patient received a cath 4/29 and elevation most likely to demand ischemia. EKG does not show signs of ischemia. Patient not endorsing ACS symptoms. Troponins have now downtrended and are normal - EKG: sinus tachycardia, left atrial enlargement and unchanged from previous  - continue to  monitor for any new sx's of ACS - cardiology following, appreciate cardiology recommendations  # Diabetes mellitus, type 2: last A1C of 10.3  - Will hold metformin while inpatient - SSI moderate; CBG's TIDAC and QHS, CBG's well controlled at present  # Tobacco abuse:  - Nicotine patch  # Cocaine abuse:  - frank discussion between myself and pt today (5/2) about the need for stopping cocaine when he goes home, especially if he is to be on chronic anticoagulation. Patient feels he is willing and able to stop using cocaine.  FEN/GI: heart healthy/carb modified  Prophylaxis: Heparin  Disposition: pending TEE, carotid doppler, PT/OT consult, decision regarding anticoagulation as outpatient  Subjective: Patient continues to feel well. Discussed results of MRI showing acute infarct, and the importance of cocaine cessation when he is discharged.  Objective: Temp:  [97.4 F (36.3 C)-98 F (36.7 C)] 97.4 F (36.3 C) (05/02 0612) Pulse Rate:  [75-106] 75 (05/02 0612) Resp:  [16-18] 16 (05/02 0612) BP: (84-100)/(54-68) 100/67 mmHg (05/02 0612) SpO2:  [93 %-97 %] 97 % (05/02 0612) Physical Exam: General: Laying in bed in no acute distress,  HEENT: EOMI Cardiovascular: Regular rate and rhythm, no murmur  Respiratory: Clear to auscultation bilaterally via anterior auscultation  Extremities: No edema Skin: Warm, well perfused  Neuro: Alert and oriented. 5/5 upper and lower extremity strength. Face symmetric, speech normal  Laboratory:  Recent Labs Lab 07/02/13 0511 07/02/13 0830 07/03/13 0430  WBC 12.1* 10.9* 12.3*  HGB 15.6 15.8 15.8  HCT 45.3 46.2 46.1  PLT 171 188 185    Recent Labs Lab 06/28/13 0949  07/01/13 0629 07/01/13 2150 07/02/13 0511  NA 138  < > 139  137 138  K 4.4  < > 4.1 4.2 4.0  CL 103  < > 103 100 102  CO2 20  < > 22 21 21   BUN 12  < > 22 25* 19  CREATININE 0.82  < > 1.02 1.13 0.97  CALCIUM 8.4  < > 9.2 9.4 9.0  PROT 6.6  --   --   --  6.3  BILITOT  0.7  --   --   --  0.6  ALKPHOS 58  --   --   --  42  ALT 33  --   --   --  20  AST 23  --   --   --  22  GLUCOSE 139*  < > 144* 172* 175*  < > = values in this interval not displayed.     Recent Labs Lab 07/02/13 0511 07/02/13 1118 07/02/13 1600 07/02/13 2152 07/03/13 0430  TROPONINI 0.33* 0.30* 0.34* 0.31* <0.30    Recent Labs Lab 07/01/13 1604 07/02/13 0701 07/02/13 1728 07/02/13 2115 07/03/13 0653  GLUCAP 107* 128* 130* 210* 146*     Imaging/Diagnostic Tests: CT head: IMPRESSION: 1. No acute intracranial abnormality. 2. Remote right frontal lobe infarct. 3. Mild left sphenoid and ethmoidal sinus disease.   MRI/MRA 5/2: Multifocal areas of acute infarction, with a greater than 3 cm infarct involving left frontal opercular cortex and subcortical white matter. No acute hemorrhage.  No intracranial anterior circulation flow reducing lesion is evident. Absent V4 left vertebral could be acute or chronic.   Leeanne Rio, MD 07/03/2013, 9:56 AM PGY-2, [place] Intern pager: [phone], text pages welcome

## 2013-07-04 DIAGNOSIS — I6350 Cerebral infarction due to unspecified occlusion or stenosis of unspecified cerebral artery: Secondary | ICD-10-CM

## 2013-07-04 DIAGNOSIS — I639 Cerebral infarction, unspecified: Secondary | ICD-10-CM

## 2013-07-04 LAB — BASIC METABOLIC PANEL
BUN: 19 mg/dL (ref 6–23)
CALCIUM: 9.2 mg/dL (ref 8.4–10.5)
CHLORIDE: 102 meq/L (ref 96–112)
CO2: 19 mEq/L (ref 19–32)
CREATININE: 1 mg/dL (ref 0.50–1.35)
GFR calc Af Amer: >90 mL/min (ref >90)
GFR calc non Af Amer: 87 mL/min — ABNORMAL LOW (ref >90)
Glucose, Bld: 139 mg/dL — ABNORMAL HIGH (ref 70–99)
POTASSIUM: 4.2 meq/L (ref 3.7–5.3)
SODIUM: 137 meq/L (ref 137–147)

## 2013-07-04 LAB — CBC
HCT: 46.3 % (ref 39.0–52.0)
Hemoglobin: 16.3 g/dL (ref 13.0–17.0)
MCH: 35.1 pg — ABNORMAL HIGH (ref 26.0–34.0)
MCHC: 35.2 g/dL (ref 30.0–36.0)
MCV: 99.6 fL (ref 78.0–100.0)
Platelets: 177 10*3/uL (ref 150–400)
RBC: 4.65 MIL/uL (ref 4.22–5.81)
RDW: 13.1 % (ref 11.5–15.5)
WBC: 9.6 10*3/uL (ref 4.0–10.5)

## 2013-07-04 LAB — HEPARIN LEVEL (UNFRACTIONATED)
Heparin Unfractionated: 0.55 IU/mL (ref 0.30–0.70)
Heparin Unfractionated: 0.57 IU/mL (ref 0.30–0.70)

## 2013-07-04 LAB — GLUCOSE, CAPILLARY
Glucose-Capillary: 139 mg/dL — ABNORMAL HIGH (ref 70–99)
Glucose-Capillary: 170 mg/dL — ABNORMAL HIGH (ref 70–99)
Glucose-Capillary: 248 mg/dL — ABNORMAL HIGH (ref 70–99)

## 2013-07-04 MED ORDER — HEPARIN (PORCINE) IN NACL 100-0.45 UNIT/ML-% IJ SOLN
1100.0000 [IU]/h | INTRAMUSCULAR | Status: DC
  Administered 2013-07-04 – 2013-07-05 (×2): 1100 [IU]/h via INTRAVENOUS
  Filled 2013-07-04 (×3): qty 250

## 2013-07-04 NOTE — Progress Notes (Signed)
FMTS Attending Note  I personally saw and evaluated the patient. The plan of care was discussed with the resident team. I agree with the assessment and plan as documented by the resident.   MRI brain consistent with multifocal acute infarct. Neurology recommending TEE/Carotid US to rule out embolic source. Currently on Heparin gtt for secondary prophylaxis. Will contact cardiology for TEE.   Dossie Arbour MD

## 2013-07-04 NOTE — Progress Notes (Signed)
Primary cardiologist: Dr. Glenetta Hew  Subjective:   No chest pain or palpitations. No headache.   Objective:   Temp:  [98 F (36.7 C)-99.2 F (37.3 C)] 98.1 F (36.7 C) (05/03 0603) Pulse Rate:  [89-103] 89 (05/03 0603) Resp:  [16-18] 18 (05/03 0603) BP: (92-99)/(48-67) 95/51 mmHg (05/03 0603) SpO2:  [95 %-100 %] 100 % (05/03 0603) Last BM Date: 07/02/13  Filed Weights   07/02/13 0107  Weight: 180 lb 8 oz (81.874 kg)    Intake/Output Summary (Last 24 hours) at 07/04/13 0643 Last data filed at 07/03/13 1459  Gross per 24 hour  Intake      0 ml  Output     15 ml  Net    -15 ml    Telemetry: Sinus rhythm.  Exam:  General: Appears comfortable.  Lungs: Clear, nonlabored.  Cardiac: RRR, no gallop.  Extremities: No pitting.   Lab Results:  Basic Metabolic Panel:  Recent Labs Lab 07/01/13 0629 07/01/13 2150 07/02/13 0511  NA 139 137 138  K 4.1 4.2 4.0  CL 103 100 102  CO2 22 21 21   GLUCOSE 144* 172* 175*  BUN 22 25* 19  CREATININE 1.02 1.13 0.97  CALCIUM 9.2 9.4 9.0  MG  --  1.8  --     Liver Function Tests:  Recent Labs Lab 06/28/13 0949 07/02/13 0511  AST 23 22  ALT 33 20  ALKPHOS 58 42  BILITOT 0.7 0.6  PROT 6.6 6.3  ALBUMIN 3.4* 3.2*    CBC:  Recent Labs Lab 07/02/13 0511 07/02/13 0830 07/03/13 0430  WBC 12.1* 10.9* 12.3*  HGB 15.6 15.8 15.8  HCT 45.3 46.2 46.1  MCV 100.4* 100.2* 100.2*  PLT 171 188 185    Cardiac Enzymes:  Recent Labs Lab 07/02/13 1600 07/02/13 2152 07/03/13 0430  TROPONINI 0.34* 0.31* <0.30    BNP:  Recent Labs  06/28/13 0949  PROBNP 955.8*    Coagulation:  Recent Labs Lab 06/29/13 2032  INR 0.97   Imaging: FINDINGS: MRI HEAD FINDINGS  Multifocal areas of acute infarction are demonstrated. There is a large area of restricted diffusion affecting the left frontal opercular cortex and subcortical white matter extending into the uncinate fasciculus, greater than 3 cm in  diameter. Much smaller areas of restricted diffusion, subcentimeter in size, affect the left parietal cortex, left occipital cortex, and right posterior frontal cortex. There is remote area of right frontal infarction with encephalomalacia and chronic blood products.  Slight premature for age cerebral and cerebellar atrophy. Mild subcortical and periventricular T2 and FLAIR hyperintensities, likely chronic microvascular ischemic change. Flow voids are maintained in both carotid, basilar, and right vertebral. No flow in the left vertebral, but no left cerebellar stroke.  No midline abnormalities. Visualized calvarium, skull base, and upper cervical osseous structures unremarkable. Scalp and extracranial soft tissues, orbits, sinuses, and mastoids show no acute process.  Compared with prior CT, the acute infarcts are not visible.  MRA HEAD FINDINGS  The right internal carotid artery system in general is diminutive compared to the left, related to azygos left ACA. 50% supraclinoid ICA stenosis on the right. Mild irregularity right ICA terminus. No right M1 MCA stenosis.  Unremarkable upper cervical and skull base left ICA. Slight non stenotic irregularity cavernous left ICA. LICA terminus widely patent. Robust azygos left A1 ACA feeding both distal anterior cerebrals. Normal M1 MCA on the left. No right or left MCA branch occlusion.  Basilar artery slightly irregular but  without focal stenosis. Right vertebral dominant. No visible V4 left vertebral artery flow. No proximal PCA disease. No cerebellar branch occlusion, except for the left PICA which is not visible.  No intracranial aneurysm is evident.  IMPRESSION: Multifocal areas of acute infarction, with a greater than 3 cm infarct involving left frontal opercular cortex and subcortical white matter. No acute hemorrhage.  No intracranial anterior circulation flow reducing lesion is evident. Absent V4 left vertebral could be  acute or chronic.   Medications:   Scheduled Medications: . atorvastatin  80 mg Oral q1800  . furosemide  40 mg Oral Daily  . insulin aspart  0-15 Units Subcutaneous TID WC  . living well with diabetes book   Does not apply Once  . metoprolol tartrate  12.5 mg Oral BID  . nicotine  21 mg Transdermal Daily  . sodium chloride  3 mL Intravenous Q12H    Infusions: . heparin 1,250 Units/hr (07/03/13 1711)     Assessment:   1. Stroke, head MRI showing multifocal areas of acute infarction, with a greater than 3 cm infarct involving left frontal opercular cortex and subcortical white matter. No acute hemorrhage.  2. NICM, LVEF 15-20%, diffuse hypokinesis. Mild nonobstructive CAD by recent cardiac catheterization. Has had LifeVest in place.  3. Type 2 diabetes mellitus, recently diagnosed, HgbA1C 10.3.  4. Tobacco abuse.  5. Hyperlipidemia, on statin.   Plan/Discussion:    Continue Lopressor, Lasix, and Lipitor. No ACE-I or ARB with low to normal BP. TEE requested per Neurology service. Will try and get on schedule for early this week.  Satira Sark, M.D., F.A.C.C.

## 2013-07-04 NOTE — Progress Notes (Signed)
ANTICOAGULATION CONSULT NOTE  Pharmacy Consult:  Heparin Indication:  CVA  No Known Allergies  Patient Measurements: Height: 5\' 5"  (165.1 cm) Weight: 180 lb 8 oz (81.874 kg) IBW/kg (Calculated) : 61.5 Heparin Dosing Weight: 78 kg  Vital Signs: Temp: 99 F (37.2 C) (05/02 2000) Temp src: Oral (05/02 2000) BP: 97/66 mmHg (05/02 2000) Pulse Rate: 102 (05/02 2000)  Labs:  Recent Labs  07/01/13 0629  07/01/13 2150 07/02/13 0511  07/02/13 0830  07/02/13 1600 07/02/13 2152 07/03/13 0430 07/03/13 1500 07/04/13 0016  HGB 15.4  --  15.9 15.6  --  15.8  --   --   --  15.8  --   --   HCT 45.6  --  46.2 45.3  --  46.2  --   --   --  46.1  --   --   PLT 186  --  174 171  --  188  --   --   --  185  --   --   HEPARINUNFRC  --   --   --   --   < > 0.27*  --  0.26*  --  0.49 0.64 0.57  CREATININE 1.02  --  1.13 0.97  --   --   --   --   --   --   --   --   TROPONINI  --   < > 0.35* 0.33*  --   --   < > 0.34* 0.31* <0.30  --   --   < > = values in this interval not displayed.  Estimated Creatinine Clearance: 90.8 ml/min (by C-G formula based on Cr of 0.97).  Assessment: 48 yo male with CHF/TIA for heparin  Goal of Therapy:  Heparin level 0.3-0.5 units/ml Monitor platelets by anticoagulation protocol: Yes   Plan:  Decrease Heparin 1150 units/hr Follow-up am labs.  Phillis Knack, PharmD, BCPS

## 2013-07-04 NOTE — Progress Notes (Signed)
Family Medicine Teaching Service Daily Progress Note Intern Pager: [phone]  Patient name: Joseph [name] Medical record number: [id number] Date of birth: 1966/02/08 Age: 48 y.o. Gender: male  Primary Care Provider: No PCP Per Patient Consultants: Neurology  Code Status: Full   Pt Overview and Major Events to Date:  4/30: admitted for TIA/stroke, discharged on 4/30 with lifevest   Assessment and Plan: Joseph [name] is a 48 y.o. male presenting with right sided weakness . PMH is significant for CHF, DM, Tobacco abuse, Cocaine abuse   # TIA vs Stroke: initially R sided weakness & difficulty understanding others; no TPA due to resolving symptoms.   Patient has significant risk factors including smoking, hyperlipidemia and diabetes and NICM with EF of less than or equal to 15%. No deficit on exam.   - CT neg; MRI 5/2 with multifocal acute infarct.   - Previously on atorvastatin 40mg  daily, will increase to lipitor 80mg  daily for secondary prevention - Carotid dopplers - PENDING - Therapy Recommendations: SLP/PT no follow up. OT pending - Neurology recommendations: Heparin anticoagulation, TEE (if + for clot will need long term anticoag but overall poor candidate)  # HFrEF: s/p cath with EF of 15%. Home with life vest. TTE on 4/28 with no evidence of thrombus.  - Continue life vest - Continue metoprolol and furosemide - cardiology following, TEE will be arranged by [facility] per Dr. Myles Gip note 5/3 for later in the week  # Elevated troponin. Resolved.  likely due to demand. Patient received a R & L Heart cath 4/29 during last hospitalization with normal Cors.   EKG does not show signs of ischemia. Patient not endorsing ACS symptoms. Troponins have now downtrended and are normal - EKG: sinus tachycardia, left atrial enlargement and unchanged from previous  - continue to monitor for any new sx's of ACS - cardiology following, appreciate cardiology recommendations  # Diabetes mellitus, type  2: last A1C of 10.3  - Will hold metformin while inpatient - SSI moderate; CBG's TIDAC and QHS, CBG's well controlled at present  # Tobacco abuse:  - Nicotine patch  # Cocaine abuse:  - frank discussion on 5/2 about the need for stopping cocaine when he goes home.  Currently denying prior use to Attending.    FEN/GI: heart healthy/carb modified  Prophylaxis: Heparin  Disposition: pending TEE, carotid doppler, decision regarding OP anticoagulation   Subjective: Pt reports feeling well. Symptoms totally resolved  Objective: Temp:  [98 F (36.7 C)-99.2 F (37.3 C)] 98.1 F (36.7 C) (05/03 0603) Pulse Rate:  [89-103] 89 (05/03 0603) Resp:  [16-18] 18 (05/03 0603) BP: (92-99)/(48-67) 95/51 mmHg (05/03 0603) SpO2:  [95 %-100 %] 100 % (05/03 0603) Physical Exam: General: Laying in bed in no acute distress,  HEENT: EOMI  Cardiovascular: Regular rate and rhythm, no murmur  Respiratory: Clear to auscultation bilaterally via anterior auscultation  Extremities: No edema Skin: Warm, well perfused  Neuro: Alert and oriented. Face symmetric, speech normal, no lateralization  Laboratory:  Recent Labs Lab 07/02/13 0511 07/02/13 0830 07/03/13 0430  WBC 12.1* 10.9* 12.3*  HGB 15.6 15.8 15.8  HCT 45.3 46.2 46.1  PLT 171 188 185    Recent Labs Lab 06/28/13 0949  07/01/13 0629 07/01/13 2150 07/02/13 0511  NA 138  < > 139 137 138  K 4.4  < > 4.1 4.2 4.0  CL 103  < > 103 100 102  CO2 20  < > 22 21 21   BUN 12  < >  22 25* 19  CREATININE 0.82  < > 1.02 1.13 0.97  CALCIUM 8.4  < > 9.2 9.4 9.0  PROT 6.6  --   --   --  6.3  BILITOT 0.7  --   --   --  0.6  ALKPHOS 58  --   --   --  42  ALT 33  --   --   --  20  AST 23  --   --   --  22  GLUCOSE 139*  < > 144* 172* 175*  < > = values in this interval not displayed.     Recent Labs Lab 07/02/13 0511 07/02/13 1118 07/02/13 1600 07/02/13 2152 07/03/13 0430  TROPONINI 0.33* 0.30* 0.34* 0.31* <0.30    Recent Labs Lab  07/03/13 0653 07/03/13 1100 07/03/13 1602 07/03/13 2106 07/04/13 0657  GLUCAP 146* 140* 224* 119* 139*     Imaging/Diagnostic Tests: CT head: IMPRESSION: 1. No acute intracranial abnormality. 2. Remote right frontal lobe infarct. 3. Mild left sphenoid and ethmoidal sinus disease.   MRI/MRA 5/2: Multifocal areas of acute infarction, with a greater than 3 cm infarct involving left frontal opercular cortex and subcortical white matter. No acute hemorrhage.  No intracranial anterior circulation flow reducing lesion is evident. Absent V4 left vertebral could be acute or chronic.   Gerda Diss, DO 07/04/2013, 8:34 AM PGY-3, [place] Intern pager: [phone], text pages welcome

## 2013-07-04 NOTE — Progress Notes (Signed)
VASCULAR LAB PRELIMINARY  PRELIMINARY  PRELIMINARY  PRELIMINARY  Carotid Dopplers completed.    Preliminary report:  1-39% ICA stenosis.  Vertebral artery flow is antegrade.  Iantha Fallen, RVT 07/04/2013, 11:22 AM

## 2013-07-04 NOTE — Progress Notes (Signed)
ANTICOAGULATION CONSULT NOTE - Follow Up Consult  Pharmacy Consult:  Heparin Indication:  CVA  No Known Allergies  Patient Measurements: Height: 5\' 5"  (165.1 cm) Weight: 180 lb 8 oz (81.874 kg) IBW/kg (Calculated) : 61.5 Heparin Dosing Weight: 78 kg  Vital Signs: Temp: 98.3 F (36.8 C) (05/03 1342) Temp src: Oral (05/03 1342) BP: 86/56 mmHg (05/03 1342) Pulse Rate: 93 (05/03 1342)  Labs:  Recent Labs  07/01/13 2150 07/02/13 0511  07/02/13 0830  07/02/13 1600 07/02/13 2152 07/03/13 0430 07/03/13 1500 07/04/13 0016 07/04/13 0833  HGB 15.9 15.6  --  15.8  --   --   --  15.8  --   --  16.3  HCT 46.2 45.3  --  46.2  --   --   --  46.1  --   --  46.3  PLT 174 171  --  188  --   --   --  185  --   --  177  HEPARINUNFRC  --   --   < > 0.27*  --  0.26*  --  0.49 0.64 0.57 0.55  CREATININE 1.13 0.97  --   --   --   --   --   --   --   --  1.00  TROPONINI 0.35* 0.33*  --   --   < > 0.34* 0.31* <0.30  --   --   --   < > = values in this interval not displayed.  Estimated Creatinine Clearance: 88.1 ml/min (by C-G formula based on Cr of 1).   Assessment: 13 YOF with embolic TIA to continue on IV heparin.  Heparin level just above goal this AM.  CBC and Pltc stable.  No bleeding reported.  Goal of Therapy:  Heparin level 0.3-0.5 units/ml Monitor platelets by anticoagulation protocol: Yes   Plan:  - Decrease IV heparin to 1100 units/hr. - Recheck heparin level with AM labs tomorrow. - F/u plans for oral anticoagulation once TEE completed.  Uvaldo Rising, BCPS  Clinical Pharmacist Pager [phone]  07/04/2013 2:24 PM

## 2013-07-04 NOTE — Progress Notes (Addendum)
Stroke Team Progress Note  HISTORY Joseph [name] is a 48 y.o. male with a history of nonischemic cardiomyopathy that was recently diagnosed. His ejection fraction was found to be 20%. He was discharged from the hospital 07/02/2013. At home, he developed right-sided weakness and some difficulty understanding people. His wife found him slumped over, and at the time EMS arrival he was noted to have a right hemiparesis. Family estimated that this lasted for a little bit more than 10 minutes. When seen by Dr. Leonel Ramsay his deficits had completely resolved. LKW: 07/02/2013 at 8 PM Patient was not administered TPA secondary to resolved symptoms. He was admitted for further evaluation and treatment.  SUBJECTIVE The patient's girlfriend is at the bedside. The patient has no complaints. He feels he is at baseline.  OBJECTIVE Most recent Vital Signs: Filed Vitals:   07/03/13 1714 07/03/13 2000 07/04/13 0000 07/04/13 0603  BP: 93/57 97/66 99/67  95/51  Pulse: 103 102 100 89  Temp: 99.2 F (37.3 C) 99 F (37.2 C) 98.7 F (37.1 C) 98.1 F (36.7 C)  TempSrc: Oral Oral Oral Oral  Resp: 16 18 18 18   Height:      Weight:      SpO2: 97% 97% 97% 100%   CBG (last 3)   Recent Labs  07/03/13 1602 07/03/13 2106 07/04/13 0657  GLUCAP 224* 119* 139*    IV Fluid Intake:   . heparin 1,250 Units/hr (07/03/13 1711)    MEDICATIONS  . atorvastatin  80 mg Oral q1800  . furosemide  40 mg Oral Daily  . insulin aspart  0-15 Units Subcutaneous TID WC  . living well with diabetes book   Does not apply Once  . metoprolol tartrate  12.5 mg Oral BID  . nicotine  21 mg Transdermal Daily  . sodium chloride  3 mL Intravenous Q12H   PRN:    Diet:  Carb Control thin liquids Activity:  Bedrest DVT Prophylaxis:  Heparin drip  CLINICALLY SIGNIFICANT STUDIES Basic Metabolic Panel:   Recent Labs Lab 07/01/13 0629 07/01/13 2150 07/02/13 0511  NA 139 137 138  K 4.1 4.2 4.0  CL 103 100 102  CO2 22 21 21    GLUCOSE 144* 172* 175*  BUN 22 25* 19  CREATININE 1.02 1.13 0.97  CALCIUM 9.2 9.4 9.0  MG  --  1.8  --    Liver Function Tests:   Recent Labs Lab 06/28/13 0949 07/02/13 0511  AST 23 22  ALT 33 20  ALKPHOS 58 42  BILITOT 0.7 0.6  PROT 6.6 6.3  ALBUMIN 3.4* 3.2*   CBC:  Recent Labs Lab 06/28/13 0949  07/01/13 2150  07/02/13 0830 07/03/13 0430  WBC 8.9  < > 11.4*  < > 10.9* 12.3*  NEUTROABS 5.6  --  5.0  --   --   --   HGB 14.1  < > 15.9  < > 15.8 15.8  HCT 41.2  < > 46.2  < > 46.2 46.1  MCV 100.0  < > 100.7*  < > 100.2* 100.2*  PLT 200  < > 174  < > 188 185  < > = values in this interval not displayed. Coagulation:   Recent Labs Lab 06/29/13 2032  LABPROT 12.7  INR 0.97   Cardiac Enzymes:   Recent Labs Lab 07/02/13 1600 07/02/13 2152 07/03/13 0430  TROPONINI 0.34* 0.31* <0.30   Urinalysis: No results found for this basename: COLORURINE, APPERANCEUR, LABSPEC, PHURINE, GLUCOSEU, HGBUR, BILIRUBINUR, KETONESUR, PROTEINUR, UROBILINOGEN, NITRITE,  LEUKOCYTESUR,  in the last 168 hours Lipid Panel    Component Value Date/Time   CHOL 176 07/03/2013 0430   TRIG 137 07/03/2013 0430   HDL 69 07/03/2013 0430   CHOLHDL 2.6 07/03/2013 0430   VLDL 27 07/03/2013 0430   LDLCALC 80 07/03/2013 0430   HgbA1C  Lab Results  Component Value Date   HGBA1C 10.3* 06/28/2013    Urine Drug Screen:     Component Value Date/Time   LABOPIA NONE DETECTED 07/01/2013 2326   COCAINSCRNUR NONE DETECTED 07/01/2013 2326   LABBENZ NONE DETECTED 07/01/2013 2326   AMPHETMU NONE DETECTED 07/01/2013 2326   THCU NONE DETECTED 07/01/2013 2326   LABBARB NONE DETECTED 07/01/2013 2326    Alcohol Level:   Recent Labs Lab 06/28/13 2045 07/02/13 1118  ETH <11 <11    CT of the brain  07/01/2013    1. No acute intracranial abnormality. 2. Remote right frontal lobe infarct. 3. Mild left sphenoid and ethmoidal sinus disease.    MRI of the brain 07/03/2013 Multifocal areas of acute infarction, with a  greater than 3 cm infarct involving left frontal opercular cortex and subcortical white matter. No acute hemorrhage.   MRA of the brain  07/03/2013 No intracranial anterior circulation flow reducing lesion is evident. Absent V4 left vertebral could be acute or chronic.   2D Echocardiogram  06/29/2013 EF 20% with no source of embolus. Severe global hypokinesis.  Carotid Doppler  pending  CXR  06/28/2013 Mild enlargement of cardiac silhouette. Suspected mild pulmonary edema though infection not entirely  Excluded.   EKG  sinus bradycardia. For complete results please see formal report.   Therapy Recommendations - no followup physical therapy but intermittent supervision recommended.  Physical Exam   Obese young african [place] male not in distress.Awake alert. Afebrile. Head is nontraumatic. Neck is supple without bruit. Hearing is normal. Cardiac exam no murmur or gallop. Lungs are clear to auscultation. Distal pulses are well felt. Neurological Exam ;  Awake  Alert oriented x 3. Normal speech and language.eye movements full without nystagmus.fundi were not visualized. Vision acuity and fields appear normal. Hearing is normal. Palatal movements are normal. Face symmetric. Tongue midline. Normal strength, tone, reflexes and coordination. Normal sensation. Gait deferred.   ASSESSMENT [name] is a 48 y.o. male presenting with transient right sided weakness. MRI completed showing multifocal areas of acute infarction with largest in left frontal opercular cortex. Symptoms felt to be embolic secondary to known cardiomyopathy.  On aspirin 81 mg orally every day prior to admission. Now on heparin for secondary stroke prevention. Patient with resultant  no deficits. Stroke work up underway.  hypertension  Diabetes, HgbA1c 10.3, goal < 7.0 Hyperlipidemia, LDL 106, on lipitor 40 mg daily PTA, now on lipitor 40 mg daily, goal LDL < 100 (< 70 for diabetics) NICM w/ CHF, EF 25% 06/28/2013.  Needs life vest and possible ICD implantation CAD - non obstructive dz, managed medically 06/28/2013  etoh use  Cocaine use  Tobacco use   Hospital day # 3  TREATMENT/PLAN   Continue heparin for secondary stroke prevention for now.  May consider long term anticoagulation if clot noted on TEE but overall not a good anticoagulation candidate given cocaine abuse and medical noncompliance.   Cardiology following. They will schedule TEE.  Complete stroke workup - carotid Doppler pending  Mobilize out of bed.  No followup physical therapy but intermittent supervision recommended.   Mikey Bussing PA-C [facility] Pager [phone])  [phone] 07/04/2013, 8:55 AM  Patient seen and evaluated. Agree with above findings and plan.  Jim Like, DO Neurology-Stroke To contact Stroke Continuity provider, please refer to [email or web address] After hours, contact General Neurology

## 2013-07-04 NOTE — Discharge Summary (Signed)
[facility] Discharge Summary  Patient name: Joseph [name] Medical record number: [id number] Date of birth: December 29, 1965 Age: 48 y.o. Gender: male Date of Admission: 07/01/2013  Date of Discharge: 07/06/2013 Admitting Physician: Willeen Niece, MD  Primary Care Provider: No PCP Per Patient Consultants: Neurology   Indication for Hospitalization: slurred speech/right sided weakness  Discharge Diagnoses/Problem List:  Patient Active Problem List   Diagnosis Date Noted  . Stroke 07/04/2013  . Non-ischemic cardiomyopathy 07/02/2013    Class: Diagnosis of  . TIA (transient ischemic attack) 07/01/2013  . Pleural effusion 06/30/2013  . 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 Chest pain 06/28/2013   Disposition: home  Discharge Condition: improved   Discharge Exam:  General: Laying in bed in no acute distress,  HEENT: EOMI  Cardiovascular: Regular rate and rhythm, no murmur  Respiratory: Clear to auscultation bilaterally via anterior auscultation  Extremities: No edema  Skin: Warm, well perfused  Neuro: Alert and oriented  Brief Hospital Course:  Joseph [name] is a 48 y.o. male presenting with right sided weakness . PMH is significant for CHF, DM, Tobacco abuse, Cocaine abuse   # Acute infarction Stroke: Patient presented with complaints of slurred speech and right sided weakness. His symptoms had resolved upon admission. He was started on a heparin drip. An MRI revealed an acute infarction, with a greater than 3 cm infarct involving left frontal opercular cortex and subcortical white matter. ECHO revealed no source of embolus. Carotid doppler were within normal limits.  He was sent for TEE which was negative for any clot. He will not need any long term anticoagulation other than a daily ASA 325 mg and increased to Lipitor 80mg .   # NICM w/ HFrEF: He was found to  have ECHO EF of 15%. Continued on home with life vest. TTE on 4/28 with no evidence of thrombus.Continued metoprolol and furosemide but had occasional episodes of hypotension during admission. Will need regular follow up to determine if he can continue the current dose of these medications.   # Elevated troponin: Troponins were elevated but thought to be secondary to demand ischemia and with a recent R & L Heart cath 4/29 during last hospitalization with normal Cors. EKG does not show signs of ischemia. Patient did not endorse ACS symptoms. Troponins have now downtrended and are normal prior to discharge.    # Diabetes mellitus, type 2: last A1C of 10.3. Patient blood sugar were below 200 prior to discharge. He was transitioned to metformin 1000 mg BID and started on Amaryl 2 mg. He will need further management and prescribed Relion monitor, strip and lancets.   # Tobacco abuse: Given nicotine patch during admission.    # Cocaine abuse: Was cocaine positive with recent prior admission. Pilar Plate discussion on 5/2 about the need for stopping cocaine when he goes home. He denied prior use to several members of the team.   Issues for Follow Up:  1. Acute infarction: may sure he is taking a daily ASA 325 mg instead of 81 mg and lipitor 80 mg instead of 40 mg  2. DM2: started on Amaryl with an increase in Metformin. Hgb A1c 10.3 and would most likely need insulin but need to make sure that he will follow up enough to make that transition. Prescribed Relion meter, strips and lancet, need to make sure he can afford these. Make sure that he understands  when and how to test his blood sugar. Will need to test his sugar once a day prior to breakfast.  3. Blood pressure: he was having episodes of hypotension and his lasix and metoprolol were held on different occasions during admission. Need to make sure he has adequate follow up to determine if he need decrease in these two medications.   Significant Procedures: TEE    Significant Labs and Imaging:   Recent Labs Lab 07/04/13 0833 07/05/13 0403 07/06/13 0553  WBC 9.6 9.3 8.7  HGB 16.3 15.4 15.9  HCT 46.3 44.5 46.2  PLT 177 182 182    Recent Labs Lab 06/30/13 0354 06/30/13 1246 07/01/13 0629 07/01/13 2150 07/02/13 0511 07/04/13 0833  NA 139  --  139 137 138 137  K 4.1  --  4.1 4.2 4.0 4.2  CL 102  --  103 100 102 102  CO2 23  --  22 21 21 19   GLUCOSE 202*  --  144* 172* 175* 139*  BUN 21  --  22 25* 19 19  CREATININE 1.03 1.12 1.02 1.13 0.97 1.00  CALCIUM 9.4  --  9.2 9.4 9.0 9.2  MG  --   --   --  1.8  --   --   ALKPHOS  --   --   --   --  42  --   AST  --   --   --   --  22  --   ALT  --   --   --   --  20  --   ALBUMIN  --   --   --   --  3.2*  --      Recent Labs Lab 07/02/13 0511 07/02/13 1118 07/02/13 1600 07/02/13 2152 07/03/13 0430  TROPONINI 0.33* 0.30* 0.34* 0.31* <0.30    Recent Labs Lab 07/05/13 1225 07/05/13 1659 07/05/13 2109 07/06/13 0650 07/06/13 1509  GLUCAP 200* 101* 167* 150* 190*   MRI/MRA 5/2:  Multifocal areas of acute infarction, with a greater than 3 cm infarct involving left frontal opercular cortex and subcortical white matter. No acute hemorrhage.  No intracranial anterior circulation flow reducing lesion is evident. Absent V4 left vertebral could be acute or chronic  CT head: IMPRESSION: 1. No acute intracranial abnormality. 2. Remote right frontal lobe infarct. 3. Mild left sphenoid and ethmoidal sinus disease.   EKG: sinus tachycardia, left atrial enlargement and unchanged from previous   Results/Tests Pending at Time of Discharge: none  Discharge Medications:    Medication List         aspirin 81 MG EC tablet  Take 1 tablet (81 mg total) by mouth daily.     atorvastatin 40 MG tablet  Commonly known as:  LIPITOR  Take 1 tablet (40 mg total) by mouth daily at 6 PM.     furosemide 40 MG tablet  Commonly known as:  LASIX  Take 1 tablet (40 mg total) by mouth daily.      glimepiride 2 MG tablet  Commonly known as:  AMARYL  Take 1 tablet (2 mg total) by mouth daily with breakfast.     glucose blood test strip  Use as instructed     metFORMIN 500 MG tablet  Commonly known as:  GLUCOPHAGE  Take 2 tablets (1,000 mg total) by mouth 2 (two) times daily with a meal. Start taking on 07/02/13. Due to CATH (dye load)  procedure 4/28.     metoprolol tartrate 12.5 mg  Tabs tablet  Commonly known as:  LOPRESSOR  Take 0.5 tablets (12.5 mg total) by mouth 2 (two) times daily.     [place]  Patient will need to test his blood glucose once daily prior to breakfast and metformin and amaryl administration.     ReliOn Ultra Thin Lancets Misc  Patient will need to test his blood glucose once daily prior to breakfast and metformin and amaryl administration.        Discharge Instructions: Please refer to Patient Instructions section of EMR for full details.  Patient was counseled important signs and symptoms that should prompt return to medical care, changes in medications, dietary instructions, activity restrictions, and follow up appointments.   Follow-Up Appointments: Follow-up Information   Follow up with [facility] On 07/07/2013. (at 2:30 pm)    Contact information:   [address] [place] [address] [phone]      Follow up with Forbes Cellar, MD. Schedule an appointment as soon as possible for a visit in 2 months. (Stroke Clinic)    Specialties:  Neurology, Radiology   Contact information:   [address] [place] [address] [phone]       Rosemarie Ax, MD 07/06/2013, 11:05 PM PGY-1, [place]

## 2013-07-05 ENCOUNTER — Encounter (HOSPITAL_COMMUNITY): Payer: Self-pay

## 2013-07-05 ENCOUNTER — Encounter (HOSPITAL_COMMUNITY)
Admission: EM | Disposition: A | Discharge status: Home / Self Care | Payer: Self-pay | Source: Home / Self Care | Attending: Family Medicine

## 2013-07-05 DIAGNOSIS — R079 Chest pain, unspecified: Secondary | ICD-10-CM

## 2013-07-05 LAB — CBC
HCT: 44.5 % (ref 39.0–52.0)
Hemoglobin: 15.4 g/dL (ref 13.0–17.0)
MCH: 34.6 pg — ABNORMAL HIGH (ref 26.0–34.0)
MCHC: 34.6 g/dL (ref 30.0–36.0)
MCV: 100 fL (ref 78.0–100.0)
Platelets: 182 10*3/uL (ref 150–400)
RBC: 4.45 MIL/uL (ref 4.22–5.81)
RDW: 13.1 % (ref 11.5–15.5)
WBC: 9.3 10*3/uL (ref 4.0–10.5)

## 2013-07-05 LAB — GLUCOSE, CAPILLARY
Glucose-Capillary: 244 mg/dL — ABNORMAL HIGH (ref 70–99)
Glucose-Capillary: 158 mg/dL — ABNORMAL HIGH (ref 70–99)
Glucose-Capillary: 200 mg/dL — ABNORMAL HIGH (ref 70–99)
Glucose-Capillary: 101 mg/dL — ABNORMAL HIGH (ref 70–99)

## 2013-07-05 LAB — HEPARIN LEVEL (UNFRACTIONATED): Heparin Unfractionated: 0.34 IU/mL (ref 0.30–0.70)

## 2013-07-05 SURGERY — CANCELLED PROCEDURE

## 2013-07-05 MED ORDER — FENTANYL CITRATE 0.05 MG/ML IJ SOLN
INTRAMUSCULAR | Status: AC
  Filled 2013-07-05: qty 2

## 2013-07-05 MED ORDER — LIDOCAINE VISCOUS 2 % MT SOLN
OROMUCOSAL | Status: AC
  Filled 2013-07-05: qty 15

## 2013-07-05 MED ORDER — SODIUM CHLORIDE 0.9 % IV SOLN
INTRAVENOUS | Status: DC
  Administered 2013-07-05 – 2013-07-06 (×2): via INTRAVENOUS

## 2013-07-05 MED ORDER — MIDAZOLAM HCL 5 MG/ML IJ SOLN
INTRAMUSCULAR | Status: AC
  Filled 2013-07-05: qty 2

## 2013-07-05 NOTE — Progress Notes (Signed)
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 Wants to know when he can go home. No  CP/ SOB. He understands that we are holding medicine and trying TEE again tomorrow.   CURRENT MEDS . atorvastatin  80 mg Oral q1800  . insulin aspart  0-15 Units Subcutaneous TID WC  . living well with diabetes book   Does not apply Once  . metoprolol tartrate  12.5 mg Oral BID  . nicotine  21 mg Transdermal Daily  . sodium chloride  3 mL Intravenous Q12H    OBJECTIVE  Filed Vitals:   07/05/13 0223 07/05/13 0535 07/05/13 1001 07/05/13 1029  BP: 88/66 101/64 89/67 91/65   Pulse: 82 80  93  Temp: 97.4 F (36.3 C) 98.9 F (37.2 C)  98.8 F (37.1 C)  TempSrc: Oral Oral  Oral  Resp: 18 18 15 20   Height:      Weight:      SpO2: 100% 98% 96% 96%   No intake or output data in the 24 hours ending 07/05/13 1156 Filed Weights   07/02/13 0107  Weight: 180 lb 8 oz (81.874 kg)    PHYSICAL EXAM  General: Pleasant, NAD. Neuro: Alert and oriented X 3. Moves all extremities spontaneously. Psych: Normal affect. HEENT:  Normal  Neck: Supple without bruits or JVD. Lungs:  Resp regular and unlabored, CTA. Heart: RRR no s3, s4, or murmurs. Abdomen: Soft, non-tender, non-distended, BS + x 4.  Extremities: No clubbing, cyanosis or edema. DP/PT/Radials 2+ and equal bilaterally.  Accessory Clinical Findings  CBC  Recent Labs  07/04/13 0833 07/05/13 0403  WBC 9.6 9.3  HGB 16.3 15.4  HCT 46.3 44.5  MCV 99.6 100.0  PLT 177 867   Basic Metabolic Panel  Recent Labs  07/04/13 0833  NA 137  K 4.2  CL 102  CO2 19  GLUCOSE 139*  BUN 19  CREATININE 1.00  CALCIUM 9.2   Cardiac Enzymes  Recent Labs  07/02/13 1600 07/02/13 2152 07/03/13 0430  TROPONINI 0.34* 0.31* <0.30   Fasting Lipid Panel  Recent Labs  07/03/13 0430  CHOL 176  HDL 69  LDLCALC 80  TRIG 137  CHOLHDL 2.6    TELE  NSR  Radiology/Studies  Ct Head Wo Contrast  07/01/2013   CLINICAL DATA:  Headache, vertigo on  EXAM: CT HEAD WITHOUT CONTRAST  TECHNIQUE: Contiguous axial images were obtained from the base of the skull through the vertex without intravenous contrast.  COMPARISON:  None.  FINDINGS: Encephalomalacia within the right frontal lobe is compatible with remote infarct.  No acute intracranial hemorrhage or large vessel territory infarct identified. No mass lesion, midline shift, or hydrocephalus. There is no extra-axial fluid collection. Gray-white matter differentiation maintained. Cerebral volume within normal limits for patient age.  Scalp soft tissues are normal. Calvarium intact. Orbital soft tissues within normal limits.  Scattered opacity noted within the ethmoidal air cells bilaterally, right greater than left. There is mild circumferential mucosal thickening within the left sphenoid sinus. No mastoid effusion.  IMPRESSION: 1. No acute intracranial abnormality. 2. Remote right frontal lobe infarct. 3. Mild left sphenoid and ethmoidal sinus disease.   Electronically Signed   By: Pincus Badder.D.  On: 07/01/2013 23:38   Ct Angio Chest W/cm &/or Wo Cm  06/28/2013   CLINICAL DATA:  Chest pain, shortness of breath.  EXAM: CT ANGIOGRAPHY CHEST WITH CONTRAST  TECHNIQUE: Multidetector CT imaging of the chest was performed using the standard protocol during bolus administration of intravenous contrast. Multiplanar CT image reconstructions and MIPs were obtained to evaluate the vascular anatomy.  CONTRAST:  139mL OMNIPAQUE IOHEXOL 350 MG/ML SOLN  COMPARISON:  04/25/2008  FINDINGS: No filling defects in the pulmonary arteries to suggest pulmonary emboli. There are small to moderate bilateral pleural  effusions. Bilateral lower lobe airspace opacities are noted. Mild interstitial prominence/ thickening. There is cardiomegaly. Scattered coronary artery calcifications. Aorta is normal caliber. No aneurysm or dissection.  Mildly prominent scattered mediastinal lymph nodes. Index right paratracheal lymph node has a short axis diameter of 14 mm. Other similarly sized mediastinal lymph nodes scattered throughout the mediastinum. These are presumably reactive/ related to congestion.  Chest wall soft tissues are unremarkable. Imaging into the upper abdomen shows no acute findings. No acute bony abnormality or focal bone lesion.  Review of the MIP images confirms the above findings.  IMPRESSION: Small to moderate bilateral pleural effusions.  Cardiomegaly.  Bilateral lower lobe airspace opacities along with diffuse interstitial thickening. I favor this represents Edema/CHF. Bilateral lower lobe pneumonia cannot be completely excluded but felt less likely.  No evidence of pulmonary embolus.  Borderline and mildly enlarged mesenteric lymph nodes, presumably reactive. These could be followed with repeat CT in 3-6 months.   Electronically Signed   By: Rolm Baptise M.D.   On: 06/28/2013 13:59   Mr Jodene Nam Head Wo Contrast  07/03/2013   CLINICAL DATA:  Right-sided weakness, now improved. Risk factors include nonischemic cardiomyopathy with ejection fraction of 20%, hypertension, diabetes, hyperlipidemia, and history of cocaine use.  EXAM: MRI HEAD WITHOUT CONTRAST  MRA HEAD WITHOUT CONTRAST  TECHNIQUE: Multiplanar, multiecho pulse sequences of the brain and surrounding structures were obtained without intravenous contrast. Angiographic images of the head were obtained using MRA technique without contrast.  COMPARISON:  CT head 07/01/2013.  FINDINGS: MRI HEAD FINDINGS  Multifocal areas of acute infarction are demonstrated. There is a large area of restricted diffusion affecting the left frontal opercular cortex and subcortical  white matter extending into the uncinate fasciculus, greater than 3 cm in diameter. Much smaller areas of restricted diffusion, subcentimeter in size, affect the left parietal cortex, left occipital cortex, and right posterior frontal cortex. There is remote area of right frontal infarction with encephalomalacia and chronic blood products.  Slight premature for age cerebral and cerebellar atrophy. Mild subcortical and periventricular T2 and FLAIR hyperintensities, likely chronic microvascular ischemic change. Flow voids are maintained in both carotid, basilar, and right vertebral. No flow in the left vertebral, but no left cerebellar stroke.  No midline abnormalities. Visualized calvarium, skull base, and upper cervical osseous structures unremarkable. Scalp and extracranial soft tissues, orbits, sinuses, and mastoids show no acute process.  Compared with prior CT, the acute infarcts are not visible.  MRA HEAD FINDINGS  The right internal carotid artery system in general is diminutive compared to the left, related to azygos left ACA. 50% supraclinoid ICA stenosis on the right. Mild irregularity right ICA terminus. No right M1 MCA stenosis.  Unremarkable upper cervical and skull base left ICA. Slight non stenotic irregularity cavernous left ICA. LICA terminus widely patent. Robust azygos left A1 ACA feeding both distal anterior cerebrals. Normal M1 MCA on the left. No right or left  MCA branch occlusion.  Basilar artery slightly irregular but without focal stenosis. Right vertebral dominant. No visible V4 left vertebral artery flow. No proximal PCA disease. No cerebellar branch occlusion, except for the left PICA which is not visible.  No intracranial aneurysm is evident.  IMPRESSION: Multifocal areas of acute infarction, with a greater than 3 cm infarct involving left frontal opercular cortex and subcortical white matter. No acute hemorrhage.  No intracranial anterior circulation flow reducing lesion is evident.  Absent V4 left vertebral could be acute or chronic.   Electronically Signed   By: Rolla Flatten M.D.   On: 07/03/2013 10:45   Mr Brain Wo Contrast  07/03/2013   CLINICAL DATA:  Right-sided weakness, now improved. Risk factors include nonischemic cardiomyopathy with ejection fraction of 20%, hypertension, diabetes, hyperlipidemia, and history of cocaine use.  EXAM: MRI HEAD WITHOUT CONTRAST  MRA HEAD WITHOUT CONTRAST  TECHNIQUE: Multiplanar, multiecho pulse sequences of the brain and surrounding structures were obtained without intravenous contrast. Angiographic images of the head were obtained using MRA technique without contrast.  COMPARISON:  CT head 07/01/2013.  FINDINGS: MRI HEAD FINDINGS  Multifocal areas of acute infarction are demonstrated. There is a large area of restricted diffusion affecting the left frontal opercular cortex and subcortical white matter extending into the uncinate fasciculus, greater than 3 cm in diameter. Much smaller areas of restricted diffusion, subcentimeter in size, affect the left parietal cortex, left occipital cortex, and right posterior frontal cortex. There is remote area of right frontal infarction with encephalomalacia and chronic blood products.  Slight premature for age cerebral and cerebellar atrophy. Mild subcortical and periventricular T2 and FLAIR hyperintensities, likely chronic microvascular ischemic change. Flow voids are maintained in both carotid, basilar, and right vertebral. No flow in the left vertebral, but no left cerebellar stroke.  No midline abnormalities. Visualized calvarium, skull base, and upper cervical osseous structures unremarkable. Scalp and extracranial soft tissues, orbits, sinuses, and mastoids show no acute process.  Compared with prior CT, the acute infarcts are not visible.  MRA HEAD FINDINGS  The right internal carotid artery system in general is diminutive compared to the left, related to azygos left ACA. 50% supraclinoid ICA stenosis on  the right. Mild irregularity right ICA terminus. No right M1 MCA stenosis.  Unremarkable upper cervical and skull base left ICA. Slight non stenotic irregularity cavernous left ICA. LICA terminus widely patent. Robust azygos left A1 ACA feeding both distal anterior cerebrals. Normal M1 MCA on the left. No right or left MCA branch occlusion.  Basilar artery slightly irregular but without focal stenosis. Right vertebral dominant. No visible V4 left vertebral artery flow. No proximal PCA disease. No cerebellar branch occlusion, except for the left PICA which is not visible.  No intracranial aneurysm is evident.  IMPRESSION: Multifocal areas of acute infarction, with a greater than 3 cm infarct involving left frontal opercular cortex and subcortical white matter. No acute hemorrhage.  No intracranial anterior circulation flow reducing lesion is evident. Absent V4 left vertebral could be acute or chronic.   Electronically Signed   By: Rolla Flatten M.D.   On: 07/03/2013 10:45   Dg Chest Portable 1 View  06/28/2013   CLINICAL DATA:  Chest pain, shortness of breath, smoker  EXAM: PORTABLE CHEST - 1 VIEW  COMPARISON:  Portable exam 0942 hr compared to 04/25/2008  FINDINGS: Enlargement of cardiac silhouette.  Mediastinal contours and pulmonary vascularity normal.  Interstitial infiltrates at the mid to lower lungs question pulmonary edema.  No  gross pleural effusion or pneumothorax.  Osseous structures unremarkable.  IMPRESSION: Mild enlargement of cardiac silhouette.  Suspected mild pulmonary edema though infection not entirely excluded.   Electronically Signed   By: Lavonia Dana M.D.   On: 06/28/2013 09:52    ASSESSMENT AND PLAN KENNIETH WINIARSKI is a 48 y.o. 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 He had been discharged from the hospital earlier that day.   Stroke- head MRI showing multifocal  areas of acute infarction, with a greater than 3 cm infarct involving left frontal opercular cortex and subcortical white matter. No acute hemorrhage. Sx now completely resolved -- TEE requested per Neurology service. Patient went for TEE today and his BP was 89/41mmHg. They could not do procedure due to hypotension and need to sedate. It was recommended that his BB be held today and try for TEE tomorrow if BP stable.  NICM- LVEF 15-20%, diffuse hypokinesis. Mild nonobstructive CAD by recent cardiac catheterization. Has had LifeVest in place.  -- Lopressor and Lasix. No ACE-I or ARB with low to normal BP.  -- Will D/C BB today to see if BP will normalize for TEE tomorrow.   Type 2 diabetes mellitus, recently diagnosed, HgbA1C 10.3.   Tobacco abuse- cessation counseled    Hyperlipidemia- on statin.   Signed, Perry Mount PA-C  Pager [phone]

## 2013-07-05 NOTE — Progress Notes (Signed)
Family Medicine Teaching Service Daily Progress Note Intern Pager: [phone]  Patient name: Joseph [name] Medical record number: [id number] Date of birth: 02-15-1966 Age: 48 y.o. Gender: male  Primary Care Provider: No PCP Per Patient Consultants: Neurology  Code Status: Full   Pt Overview and Major Events to Date:  4/30: admitted for TIA/stroke, discharged on 4/30 with lifevest  5/2: MRI with multifocal acute infarct    Assessment and Plan: Joseph [name] is a 48 y.o. male presenting with right sided weakness . PMH is significant for CHF, DM, Tobacco abuse, Cocaine abuse   # Acute infarction Stroke: symptoms have resolved.   Patient has significant risk factors including smoking, hyperlipidemia and diabetes and NICM with EF of less than or equal to 15%. No deficit on exam.   -  TEE: patient hypotensive today so held lasix and scheduled for tomorrow.   - further anticoagulation will be determined on this test    - lipitor 80mg  daily - Carotid dopplers: 1-39%  - Therapy Recommendations: SLP/PT no follow up.  - Neurology recommendations: Heparin anticoagulation, TEE (if + for clot will need long term anticoag but overall poor candidate)  # NICM w/ HFrEF: s/p cath with EF of 15%. Home with life vest. TTE on 4/28 with no evidence of thrombus.  - Continue life vest - Continue metoprolol, furosemide, lipitor - cardiology following,  - TEE will be arranged by [facility] per Dr. Myles Gip note 5/3 for later in the week  # Elevated troponin. Resolved.   - EKG: sinus tachycardia, left atrial enlargement and unchanged from previous  - cardiology following, appreciate cardiology recommendations  # Diabetes mellitus, type 2: last A1C of 10.3  - Will hold metformin while inpatient - SSI moderate; CBG's TIDAC and QHS, CBG's well controlled at present  # Tobacco abuse:  - Nicotine patch  # Cocaine abuse:  - frank discussion on 5/2 about the need for stopping cocaine when he goes home.   Currently denying prior use to Attending.    FEN/GI: heart healthy/carb modified  Prophylaxis: Heparin  Disposition: pending TEE,   Subjective: Patient with no complaints this morning. He understands that he is going for a TEE today.   Objective: Temp:  [97.4 F (36.3 C)-99 F (37.2 C)] 98.9 F (37.2 C) (05/04 0535) Pulse Rate:  [80-100] 80 (05/04 0535) Resp:  [18] 18 (05/04 0535) BP: (86-107)/(56-75) 101/64 mmHg (05/04 0535) SpO2:  [93 %-100 %] 98 % (05/04 0535) Physical Exam: General: Laying in bed in no acute distress,  HEENT: EOMI  Cardiovascular: Regular rate and rhythm, no murmur  Respiratory: Clear to auscultation bilaterally via anterior auscultation  Extremities: No edema Skin: Warm, well perfused  Neuro: Alert and oriented.  Laboratory:  Recent Labs Lab 07/03/13 0430 07/04/13 0833 07/05/13 0403  WBC 12.3* 9.6 9.3  HGB 15.8 16.3 15.4  HCT 46.1 46.3 44.5  PLT 185 177 182    Recent Labs Lab 06/28/13 0949  07/01/13 2150 07/02/13 0511 07/04/13 0833  NA 138  < > 137 138 137  K 4.4  < > 4.2 4.0 4.2  CL 103  < > 100 102 102  CO2 20  < > 21 21 19   BUN 12  < > 25* 19 19  CREATININE 0.82  < > 1.13 0.97 1.00  CALCIUM 8.4  < > 9.4 9.0 9.2  PROT 6.6  --   --  6.3  --   BILITOT 0.7  --   --  0.6  --  ALKPHOS 58  --   --  42  --   ALT 33  --   --  20  --   AST 23  --   --  22  --   GLUCOSE 139*  < > 172* 175* 139*  < > = values in this interval not displayed.     Recent Labs Lab 07/02/13 0511 07/02/13 1118 07/02/13 1600 07/02/13 2152 07/03/13 0430  TROPONINI 0.33* 0.30* 0.34* 0.31* <0.30    Recent Labs Lab 07/03/13 2106 07/04/13 0657 07/04/13 1131 07/04/13 1659 07/04/13 2214  GLUCAP 119* 139* 170* 244* 248*     Imaging/Diagnostic Tests: CT head: IMPRESSION: 1. No acute intracranial abnormality. 2. Remote right frontal lobe infarct. 3. Mild left sphenoid and ethmoidal sinus disease.   MRI/MRA 5/2: Multifocal areas of acute infarction,  with a greater than 3 cm infarct involving left frontal opercular cortex and subcortical white matter. No acute hemorrhage.  No intracranial anterior circulation flow reducing lesion is evident. Absent V4 left vertebral could be acute or chronic.   Rosemarie Ax, MD 07/05/2013, 7:24 AM PGY-1, Why Intern pager: [phone], text pages welcome

## 2013-07-05 NOTE — Evaluation (Signed)
Occupational Therapy Evaluation Patient Details Name: Joseph [name] MRN: [id number] DOB: 09-08-1965 Today's Date: 07/05/2013    History of Present Illness Joseph [name] is a 48 y.o. male with a history of nonischemic cardiomyopathy that was recently diagnosed. His ejection fraction was found to be 20%. He was discharged from the hospital earlier today 07/02/2013. At home, he developed right-sided weakness and some difficulty understanding people. His wife found him slumped over, and at the time EMS arrival he was noted to have a right hemiparesis. Family estimates that this lasted for a little bit more than 10 minutes. He currently feels that he has completely resolved.  Admitted for TIA/CVA work-up. MRI revealed multifocal areas of acute infarction, with a greater than 3 cm infarct involving left frontal opercular cortex and subcortical white matter.     Clinical Impression   Pt moving well during session. Education provided during session. No further OT needs at this time.    Follow Up Recommendations  No OT follow up    Equipment Recommendations  None recommended by OT    Recommendations for Other Services       Precautions / Restrictions Precautions Precautions: None Precaution Comments: Has Life Vest. Restrictions Weight Bearing Restrictions: No      Mobility Bed Mobility                  Transfers Overall transfer level: Independent Equipment used: None                  Balance                                            ADL Overall ADL's : Modified independent                                       General ADL Comments: Educated on signs/symptoms of stroke and importance of getting help right away. Also, recommended to avoid canned foods and to stop smoking as it increases chance for stroke. Educated to avoid hot surfaces/dangerous and sharp objects due to decreased sensation in Lt hand.     Vision        Tracking/Visual Pursuits: Other (comment) (initially losing pen on both sides at times; with cues pt able to track nicely keeping eyes on pen) Saccades: Decreased speed of saccadic movement;Within functional limits           Perception     Praxis      Pertinent Vitals/Pain No pain reported.      Hand Dominance Right   Extremity/Trunk Assessment Upper Extremity Assessment Upper Extremity Assessment: LUE deficits/detail LUE Sensation: decreased light touch   Lower Extremity Assessment Lower Extremity Assessment: Overall WFL for tasks assessed       Communication Communication Communication: No difficulties   Cognition Arousal/Alertness: Awake/alert Behavior During Therapy: WFL for tasks assessed/performed Overall Cognitive Status: Within Functional Limits for tasks assessed                     General Comments       Exercises       Shoulder Instructions      Home Living Family/patient expects to be discharged to:: Private residence Living Arrangements: Spouse/significant other Available Help at Discharge: Family;Available 24 hours/day (girlfriend) Type  of Home: House Home Access: Stairs to enter Technical brewer of Steps: 3 Entrance Stairs-Rails: None Home Layout: One level     Bathroom Shower/Tub: Chief Strategy Officer: None          Prior Functioning/Environment Level of Independence: Independent             OT Diagnosis:     OT Problem List:     OT Treatment/Interventions:      OT Goals(Current goals can be found in the care plan section)    OT Frequency:     Barriers to D/C:            Co-evaluation              End of Session    Activity Tolerance:  patient tolerated treatment well Patient left:  in bed   Time: 3474-2595 OT Time Calculation (min): 12 min Charges:  OT General Charges $OT Visit: 1 Procedure OT Evaluation $Initial OT Evaluation Tier I: 1 Procedure G-CodesBenito Mccreedy OTR/L [phone] 07/05/2013, 4:47 PM

## 2013-07-05 NOTE — Progress Notes (Signed)
Per Dr. Radford Pax will reschedule TEE for tomorrow due to low BP and BP meds given and not held.

## 2013-07-05 NOTE — Progress Notes (Signed)
Stroke Team Progress Note  HISTORY Joseph [name] is a 48 y.o. male with a history of nonischemic cardiomyopathy that was recently diagnosed. His ejection fraction was found to be 20%. He was discharged from the hospital 07/02/2013. At home, he developed right-sided weakness and some difficulty understanding people. His wife found him slumped over, and at the time EMS arrival he was noted to have a right hemiparesis. Family estimated that this lasted for a little bit more than 10 minutes. When seen by Dr. Leonel Ramsay his deficits had completely resolved. LKW: 07/02/2013 at 8 PM Patient was not administered TPA secondary to resolved symptoms. He was admitted for further evaluation and treatment.  SUBJECTIVE   OBJECTIVE Most recent Vital Signs: Filed Vitals:   07/05/13 0223 07/05/13 0535 07/05/13 1001 07/05/13 1029  BP: 88/66 101/64 89/67 91/65   Pulse: 82 80  93  Temp: 97.4 F (36.3 C) 98.9 F (37.2 C)  98.8 F (37.1 C)  TempSrc: Oral Oral  Oral  Resp: 18 18 15 20   Height:      Weight:      SpO2: 100% 98% 96% 96%   CBG (last 3)   Recent Labs  07/04/13 1659 07/04/13 2214 07/05/13 0648  GLUCAP 244* 248* 158*    IV Fluid Intake:   . sodium chloride 20 mL/hr at 07/05/13 1045  . heparin 1,100 Units/hr (07/04/13 1449)    MEDICATIONS  . atorvastatin  80 mg Oral q1800  . insulin aspart  0-15 Units Subcutaneous TID WC  . living well with diabetes book   Does not apply Once  . nicotine  21 mg Transdermal Daily  . sodium chloride  3 mL Intravenous Q12H   PRN:    Diet:  Cardiac thin liquids Activity:  Ambulate with assistance DVT Prophylaxis:  Heparin drip  CLINICALLY SIGNIFICANT STUDIES Basic Metabolic Panel:   Recent Labs Lab 07/01/13 0629 07/01/13 2150 07/02/13 0511 07/04/13 0833  NA 139 137 138 137  K 4.1 4.2 4.0 4.2  CL 103 100 102 102  CO2 22 21 21 19   GLUCOSE 144* 172* 175* 139*  BUN 22 25* 19 19  CREATININE 1.02 1.13 0.97 1.00  CALCIUM 9.2 9.4 9.0 9.2  MG  --   1.8  --   --    Liver Function Tests:   Recent Labs Lab 07/02/13 0511  AST 22  ALT 20  ALKPHOS 42  BILITOT 0.6  PROT 6.3  ALBUMIN 3.2*   CBC:  Recent Labs Lab 07/01/13 2150  07/04/13 0833 07/05/13 0403  WBC 11.4*  < > 9.6 9.3  NEUTROABS 5.0  --   --   --   HGB 15.9  < > 16.3 15.4  HCT 46.2  < > 46.3 44.5  MCV 100.7*  < > 99.6 100.0  PLT 174  < > 177 182  < > = values in this interval not displayed. Coagulation:   Recent Labs Lab 06/29/13 2032  LABPROT 12.7  INR 0.97   Cardiac Enzymes:   Recent Labs Lab 07/02/13 1600 07/02/13 2152 07/03/13 0430  TROPONINI 0.34* 0.31* <0.30   Urinalysis: No results found for this basename: COLORURINE, APPERANCEUR, LABSPEC, PHURINE, GLUCOSEU, HGBUR, BILIRUBINUR, KETONESUR, PROTEINUR, UROBILINOGEN, NITRITE, LEUKOCYTESUR,  in the last 168 hours Lipid Panel    Component Value Date/Time   CHOL 176 07/03/2013 0430   TRIG 137 07/03/2013 0430   HDL 69 07/03/2013 0430   CHOLHDL 2.6 07/03/2013 0430   VLDL 27 07/03/2013 0430   LDLCALC 80  07/03/2013 0430   HgbA1C  Lab Results  Component Value Date   HGBA1C 10.3* 06/28/2013    Urine Drug Screen:     Component Value Date/Time   LABOPIA NONE DETECTED 07/01/2013 2326   COCAINSCRNUR NONE DETECTED 07/01/2013 2326   LABBENZ NONE DETECTED 07/01/2013 2326   AMPHETMU NONE DETECTED 07/01/2013 2326   THCU NONE DETECTED 07/01/2013 2326   LABBARB NONE DETECTED 07/01/2013 2326    Alcohol Level:   Recent Labs Lab 06/28/13 2045 07/02/13 1118  ETH <11 <11    CT of the brain  07/01/2013    1. No acute intracranial abnormality. 2. Remote right frontal lobe infarct. 3. Mild left sphenoid and ethmoidal sinus disease.    MRI of the brain 07/03/2013 Multifocal areas of acute infarction, with a greater than 3 cm infarct involving left frontal opercular cortex and subcortical white matter. No acute hemorrhage.  MRA of the brain  07/03/2013 No intracranial anterior circulation flow reducing lesion is  evident. Absent V4 left vertebral could be acute or chronic.  2D Echocardiogram  06/29/2013 EF 20% with no source of embolus. Severe global hypokinesis.  Carotid Doppler  No evidence of hemodynamically significant internal carotid artery stenosis. Vertebral artery flow is antegrade.   CXR  06/28/2013 Mild enlargement of cardiac silhouette. Suspected mild pulmonary edema though infection not entirely  Excluded.   EKG  sinus bradycardia. For complete results please see formal report.   Therapy Recommendations - no followup physical therapy but intermittent supervision recommended.  Physical Exam   Obese young african american male not in distress.Awake alert. Afebrile. Head is nontraumatic. Neck is supple without bruit. Hearing is normal. Cardiac exam no murmur or gallop. Lungs are clear to auscultation. Distal pulses are well felt. Neurological Exam ;  Awake  Alert oriented x 3. Normal speech and language.eye movements full without nystagmus.fundi were not visualized. Vision acuity and fields appear normal. Hearing is normal. Palatal movements are normal. Face symmetric. Tongue midline. Normal strength, tone, reflexes and coordination. Normal sensation. Gait deferred.   ASSESSMENT Joseph [name] is a 47 y.o. male presenting with transient right sided weakness. MRI completed showing multifocal areas of acute infarction with largest in left frontal opercular cortex. Stroke felt to be embolic secondary to known cardiomyopathy.  On aspirin 81 mg orally every day prior to admission. Now on heparin for secondary stroke prevention. Patient with no resultant  Neuro deficits. Stroke work up completed.  hypertension  Diabetes, HgbA1c 10.3, goal < 7.0 Hyperlipidemia, LDL 106, on lipitor 40 mg daily PTA, now on lipitor 40 mg daily, goal LDL < 70 for diabetics NICM w/ CHF, EF 25% 06/28/2013. Needs life vest and possible ICD implantation CAD - non obstructive dz, managed medically 06/28/2013  etoh  use  Cocaine use  Tobacco use  Probable obstructive sleep apnea   Unable to complete TEE today due to hypotension.  Hospital day # 4  TREATMENT/PLAN  Continue heparin for secondary stroke prevention for now.  May consider long term anticoagulation if clot noted on TEE but overall not a good anticoagulation candidate given cocaine abuse and medical noncompliance.   Cardiology holding BB for now and rescheduled TEE for tomorrow.   OP testing for obstructive sleep apnea. Dr. Sethi can arrange during f/u with him    SHARON BIBY, MSN, RN, ANVP-BC, ANP-BC, GNP-BC [facility] Pager: [phone] 07/05/2013 5:14 PM  I have personally obtained a history, examined the patient, evaluated imaging results, and formulated the assessment and   plan of care. I agree with the above.  Pramod Sethi, MD    To contact Stroke Continuity provider, please refer to [email or web address]. After hours, contact General Neurology   

## 2013-07-05 NOTE — Progress Notes (Signed)
The patient was examined and the chart reviewed. He needs TEE to r/o endocarditis and/or other sources of cardiogenic-emboli.  Procedure cancelled due to low BP per Dr. Radford Pax. Will hold meds that affect BP and hope procedure can be done tomorrow.

## 2013-07-05 NOTE — Progress Notes (Signed)
ANTICOAGULATION CONSULT NOTE - Follow Up Consult  Pharmacy Consult:  Heparin Indication:  CVA  No Known Allergies  Patient Measurements: Height: 5\' 5"  (165.1 cm) Weight: 180 lb 8 oz (81.874 kg) IBW/kg (Calculated) : 61.5 Heparin Dosing Weight: 78 kg  Vital Signs: Temp: 98.8 F (37.1 C) (05/04 1029) Temp src: Oral (05/04 1029) BP: 91/65 mmHg (05/04 1029) Pulse Rate: 93 (05/04 1029)  Labs:  Recent Labs  07/02/13 1600 07/02/13 2152  07/03/13 0430  07/04/13 0016 07/04/13 0833 07/05/13 0403  HGB  --   --   < > 15.8  --   --  16.3 15.4  HCT  --   --   --  46.1  --   --  46.3 44.5  PLT  --   --   --  185  --   --  177 182  HEPARINUNFRC 0.26*  --   --  0.49  < > 0.57 0.55 0.34  CREATININE  --   --   --   --   --   --  1.00  --   TROPONINI 0.34* 0.31*  --  <0.30  --   --   --   --   < > = values in this interval not displayed.  Estimated Creatinine Clearance: 88.1 ml/min (by C-G formula based on Cr of 1).   Assessment: 48 year old male with embolic TIA on IV heparin.  Heparin level is therapeutic this morning.  Patient is not on any anticoagulation prior to admission.  CBC remains stable.   Goal of Therapy:  Heparin level 0.3-0.5 units/ml Monitor platelets by anticoagulation protocol: Yes   Plan:  - Continue heparin at 1100 units/hr - Recheck heparin level with AM labs tomorrow - F/u plans for oral anticoagulation once TEE completed   Hughes Better, PharmD, BCPS Clinical Pharmacist Pager: [phone] 07/05/2013 11:26 AM

## 2013-07-05 NOTE — Progress Notes (Signed)
Patient brought down for TEE to rule out source.  He has a severely depressed EF at 15% which is nonischemic and has a life vest.  He is on low dose metoprolol and Lasix.  His BP this am is 89/25mmHg.  Cannot due TEE today due to hypotension and need to sedate for procedure. Recommend holding beta blockers today and try for TEE tomorrow if BP stable.

## 2013-07-05 NOTE — Progress Notes (Signed)
RN notified that Joseph [name] has been cancelled today because BP is low.

## 2013-07-05 NOTE — Progress Notes (Signed)
FMTS Attending Note  I personally saw and evaluated the patient. The plan of care was discussed with the resident team. I agree with the assessment and plan as documented by the resident.   Caroid [place] negative for significant stenosis. TEE planned for tomorrow AM. No further neurologic symptoms.   Anticipate discharge tomorrow pending TEE results.  Dossie Arbour MD

## 2013-07-05 NOTE — Interval H&P Note (Signed)
History and Physical Interval Note:  07/05/2013 10:07 AM  Joseph [name]  has presented today for surgery, with the diagnosis of stroke  The various methods of treatment have been discussed with the patient and family. After consideration of risks, benefits and other options for treatment, the patient has consented to  Procedure(s): TRANSESOPHAGEAL ECHOCARDIOGRAM (TEE) (N/A) as a surgical intervention .  The patient's history has been reviewed, patient examined, no change in status, stable for surgery.  I have reviewed the patient's chart and labs.  Questions were answered to the patient's satisfaction.     [name]

## 2013-07-06 ENCOUNTER — Encounter (HOSPITAL_COMMUNITY)
Admission: EM | Disposition: A | Discharge status: Home / Self Care | Payer: Self-pay | Source: Home / Self Care | Attending: Family Medicine

## 2013-07-06 ENCOUNTER — Encounter: Payer: Self-pay | Admitting: Cardiovascular Disease

## 2013-07-06 ENCOUNTER — Telehealth: Payer: Self-pay | Admitting: Licensed Clinical Social Worker

## 2013-07-06 ENCOUNTER — Encounter (HOSPITAL_COMMUNITY): Payer: Self-pay | Admitting: *Deleted

## 2013-07-06 DIAGNOSIS — I6789 Other cerebrovascular disease: Secondary | ICD-10-CM

## 2013-07-06 HISTORY — PX: TEE WITHOUT CARDIOVERSION: SHX5443

## 2013-07-06 LAB — HEPARIN LEVEL (UNFRACTIONATED): Heparin Unfractionated: 0.39 IU/mL (ref 0.30–0.70)

## 2013-07-06 LAB — GLUCOSE, CAPILLARY
GLUCOSE-CAPILLARY: 167 mg/dL — AB (ref 70–99)
GLUCOSE-CAPILLARY: 190 mg/dL — AB (ref 70–99)
Glucose-Capillary: 150 mg/dL — ABNORMAL HIGH (ref 70–99)

## 2013-07-06 LAB — CBC
HCT: 46.2 % (ref 39.0–52.0)
Hemoglobin: 15.9 g/dL (ref 13.0–17.0)
MCH: 34.5 pg — ABNORMAL HIGH (ref 26.0–34.0)
MCHC: 34.4 g/dL (ref 30.0–36.0)
MCV: 100.2 fL — ABNORMAL HIGH (ref 78.0–100.0)
Platelets: 182 10*3/uL (ref 150–400)
RBC: 4.61 MIL/uL (ref 4.22–5.81)
RDW: 13 % (ref 11.5–15.5)
WBC: 8.7 10*3/uL (ref 4.0–10.5)

## 2013-07-06 SURGERY — ECHOCARDIOGRAM, TRANSESOPHAGEAL
Anesthesia: Moderate Sedation

## 2013-07-06 MED ORDER — MIDAZOLAM HCL 10 MG/2ML IJ SOLN
INTRAMUSCULAR | Status: DC | PRN
  Administered 2013-07-06 (×3): 2 mg via INTRAVENOUS

## 2013-07-06 MED ORDER — FUROSEMIDE 40 MG PO TABS
40.0000 mg | ORAL_TABLET | Freq: Every day | ORAL | Status: DC
  Filled 2013-07-06: qty 1

## 2013-07-06 MED ORDER — METOPROLOL TARTRATE 12.5 MG HALF TABLET
12.5000 mg | ORAL_TABLET | Freq: Two times a day (BID) | ORAL | Status: DC
  Filled 2013-07-06: qty 1

## 2013-07-06 MED ORDER — MIDAZOLAM HCL 5 MG/ML IJ SOLN
INTRAMUSCULAR | Status: AC
  Filled 2013-07-06: qty 2

## 2013-07-06 MED ORDER — ASPIRIN EC 81 MG PO TBEC
81.0000 mg | DELAYED_RELEASE_TABLET | Freq: Every day | ORAL | Status: DC
  Administered 2013-07-06: 81 mg via ORAL
  Filled 2013-07-06: qty 1

## 2013-07-06 MED ORDER — METFORMIN HCL 500 MG PO TABS: 1000.0000 mg | ORAL_TABLET | Freq: Two times a day (BID) | ORAL | Status: DC

## 2013-07-06 MED ORDER — RELION LANCETS ULTRA-THIN 30G MISC: Status: DC

## 2013-07-06 MED ORDER — METFORMIN HCL 500 MG PO TABS
500.0000 mg | ORAL_TABLET | Freq: Two times a day (BID) | ORAL | Status: DC
  Filled 2013-07-06 (×2): qty 1

## 2013-07-06 MED ORDER — FENTANYL CITRATE 0.05 MG/ML IJ SOLN
INTRAMUSCULAR | Status: AC
  Filled 2013-07-06: qty 2

## 2013-07-06 MED ORDER — GLUCOSE BLOOD VI STRP: ORAL_STRIP | Status: DC

## 2013-07-06 MED ORDER — FENTANYL CITRATE 0.05 MG/ML IJ SOLN
INTRAMUSCULAR | Status: DC | PRN
  Administered 2013-07-06 (×3): 25 ug via INTRAVENOUS

## 2013-07-06 MED ORDER — GLIMEPIRIDE 2 MG PO TABS: 2.0000 mg | ORAL_TABLET | Freq: Every day | ORAL | Status: DC

## 2013-07-06 MED ORDER — RELION PRIME MONITOR DEVI: Status: DC

## 2013-07-06 MED ORDER — BUTAMBEN-TETRACAINE-BENZOCAINE 2-2-14 % EX AERO
INHALATION_SPRAY | CUTANEOUS | Status: DC | PRN
  Administered 2013-07-06: 2 via TOPICAL

## 2013-07-06 NOTE — Progress Notes (Signed)
ANTICOAGULATION CONSULT NOTE - Follow Up Consult  Pharmacy Consult:  Heparin Indication:  CVA  No Known Allergies  Patient Measurements: Height: 5\' 5"  (165.1 cm) Weight: 180 lb 8 oz (81.874 kg) IBW/kg (Calculated) : 61.5 Heparin Dosing Weight: 78 kg  Vital Signs: Temp: 97.6 F (36.4 C) (05/05 0600) Temp src: Oral (05/05 0600) BP: 93/65 mmHg (05/05 0600) Pulse Rate: 94 (05/05 0600)  Labs:  Recent Labs  07/04/13 0833 07/05/13 0403 07/06/13 0553  HGB 16.3 15.4 15.9  HCT 46.3 44.5 46.2  PLT 177 182 182  HEPARINUNFRC 0.55 0.34 0.39  CREATININE 1.00  --   --     Estimated Creatinine Clearance: 88.1 ml/min (by C-G formula based on Cr of 1).   Assessment: 48 year old male with embolic TIA on IV heparin.  Heparin level is therapeutic this morning.  Patient is not on any anticoagulation prior to admission.  CBC remains stable. No signs or symptoms of bleeding.  Goal of Therapy:  Heparin level 0.3-0.5 units/ml Monitor platelets by anticoagulation protocol: Yes   Plan:  - Continue heparin at 1100 units/hr - Next heparin level with AM labs  - F/u plans for oral anticoagulation once TEE completed   Hughes Better, PharmD, BCPS Clinical Pharmacist Pager: [phone] 07/06/2013 8:53 AM

## 2013-07-06 NOTE — Interval H&P Note (Signed)
History and Physical Interval Note:  07/06/2013 7:42 AM  Joseph [name]  has presented today for surgery, with the diagnosis of stroke  The various methods of treatment have been discussed with the patient and family. After consideration of risks, benefits and other options for treatment, the patient has consented to  Procedure(s): TRANSESOPHAGEAL ECHOCARDIOGRAM (TEE) (N/A) as a surgical intervention .  The patient's history has been reviewed, patient examined, no change in status, stable for surgery.  I have reviewed the patient's chart and labs.  Questions were answered to the patient's satisfaction.     [name]

## 2013-07-06 NOTE — Progress Notes (Addendum)
Stroke Team Progress Note  HISTORY Joseph [name] is a 48 y.o. male with a history of nonischemic cardiomyopathy that was recently diagnosed. His ejection fraction was found to be 20%. He was discharged from the hospital 07/02/2013. At home, he developed right-sided weakness and some difficulty understanding people. His wife found him slumped over, and at the time EMS arrival he was noted to have a right hemiparesis. Family estimated that this lasted for a little bit more than 10 minutes. When seen by Dr. Leonel Ramsay his deficits had completely resolved. LKW: 07/02/2013 at 8 PM Patient was not administered TPA secondary to resolved symptoms. He was admitted for further evaluation and treatment.  SUBJECTIVE Patient up on side of bed. No change since yesterday.  OBJECTIVE Most recent Vital Signs: Filed Vitals:   07/05/13 1848 07/05/13 2106 07/06/13 0128 07/06/13 0600  BP: 97/70 94/73 94/58  93/65  Pulse: 101 95 88 94  Temp: 99.1 F (37.3 C) 98.4 F (36.9 C) 97.3 F (36.3 C) 97.6 F (36.4 C)  TempSrc: Oral Oral Oral Oral  Resp: 20 20 18 18   Height:      Weight:      SpO2: 96% 99% 97% 98%   CBG (last 3)   Recent Labs  07/05/13 1659 07/05/13 2109 07/06/13 0650  GLUCAP 101* 167* 150*    IV Fluid Intake:   . sodium chloride Stopped (07/05/13 1357)  . heparin 1,100 Units/hr (07/05/13 1356)    MEDICATIONS  . atorvastatin  80 mg Oral q1800  . insulin aspart  0-15 Units Subcutaneous TID WC  . living well with diabetes book   Does not apply Once  . nicotine  21 mg Transdermal Daily  . sodium chloride  3 mL Intravenous Q12H   PRN:    Diet:  NPO thin liquids Activity:  Ambulate with assistance DVT Prophylaxis:  Heparin drip  CLINICALLY SIGNIFICANT STUDIES Basic Metabolic Panel:   Recent Labs Lab 07/01/13 0629 07/01/13 2150 07/02/13 0511 07/04/13 0833  NA 139 137 138 137  K 4.1 4.2 4.0 4.2  CL 103 100 102 102  CO2 22 21 21 19   GLUCOSE 144* 172* 175* 139*  BUN 22 25* 19 19   CREATININE 1.02 1.13 0.97 1.00  CALCIUM 9.2 9.4 9.0 9.2  MG  --  1.8  --   --    Liver Function Tests:   Recent Labs Lab 07/02/13 0511  AST 22  ALT 20  ALKPHOS 42  BILITOT 0.6  PROT 6.3  ALBUMIN 3.2*   CBC:  Recent Labs Lab 07/01/13 2150  07/05/13 0403 07/06/13 0553  WBC 11.4*  < > 9.3 8.7  NEUTROABS 5.0  --   --   --   HGB 15.9  < > 15.4 15.9  HCT 46.2  < > 44.5 46.2  MCV 100.7*  < > 100.0 100.2*  PLT 174  < > 182 182  < > = values in this interval not displayed. Coagulation:   Recent Labs Lab 06/29/13 2032  LABPROT 12.7  INR 0.97   Cardiac Enzymes:   Recent Labs Lab 07/02/13 1600 07/02/13 2152 07/03/13 0430  TROPONINI 0.34* 0.31* <0.30   Urinalysis: No results found for this basename: COLORURINE, APPERANCEUR, LABSPEC, PHURINE, GLUCOSEU, HGBUR, BILIRUBINUR, KETONESUR, PROTEINUR, UROBILINOGEN, NITRITE, LEUKOCYTESUR,  in the last 168 hours Lipid Panel    Component Value Date/Time   CHOL 176 07/03/2013 0430   TRIG 137 07/03/2013 0430   HDL 69 07/03/2013 0430   CHOLHDL 2.6 07/03/2013 0430  VLDL 27 07/03/2013 0430   LDLCALC 80 07/03/2013 0430   HgbA1C  Lab Results  Component Value Date   HGBA1C 10.3* 06/28/2013    Urine Drug Screen:     Component Value Date/Time   LABOPIA NONE DETECTED 07/01/2013 2326   COCAINSCRNUR NONE DETECTED 07/01/2013 2326   LABBENZ NONE DETECTED 07/01/2013 2326   AMPHETMU NONE DETECTED 07/01/2013 2326   THCU NONE DETECTED 07/01/2013 2326   LABBARB NONE DETECTED 07/01/2013 2326    Alcohol Level:   Recent Labs Lab 07/02/13 1118  ETH <11    CT of the brain  07/01/2013    1. No acute intracranial abnormality. 2. Remote right frontal lobe infarct. 3. Mild left sphenoid and ethmoidal sinus disease.    MRI of the brain 07/03/2013 Multifocal areas of acute infarction, with a greater than 3 cm infarct involving left frontal opercular cortex and subcortical white matter. No acute hemorrhage.  MRA of the brain  07/03/2013 No intracranial  anterior circulation flow reducing lesion is evident. Absent V4 left vertebral could be acute or chronic.  2D Echocardiogram  06/29/2013 EF 20% with no source of embolus. Severe global hypokinesis.  Carotid Doppler  No evidence of hemodynamically significant internal carotid artery stenosis. Vertebral artery flow is antegrade.   CXR  06/28/2013 Mild enlargement of cardiac silhouette. Suspected mild pulmonary edema though infection not entirely  Excluded.   EKG  sinus bradycardia. For complete results please see formal report.   Therapy Recommendations - no followup physical therapy but intermittent supervision recommended.  Physical Exam   Obese young african [place] male not in distress.Awake alert. Afebrile. Head is nontraumatic. Neck is supple without bruit. Hearing is normal. Cardiac exam no murmur or gallop. Lungs are clear to auscultation. Distal pulses are well felt. Neurological Exam ;  Awake  Alert oriented x 3. Normal speech and language.eye movements full without nystagmus.fundi were not visualized. Vision acuity and fields appear normal. Hearing is normal. Palatal movements are normal. Face symmetric. Tongue midline. Normal strength, tone, reflexes and coordination. Normal sensation. Gait deferred.   ASSESSMENT Joseph [name] is a 48 y.o. male presenting with transient right sided weakness. MRI completed showing multifocal areas of acute infarction with largest in left frontal opercular cortex. Stroke felt to be embolic secondary to known cardiomyopathy.  On aspirin 81 mg orally every day prior to admission. Now on heparin for secondary stroke prevention. Patient with no resultant  Neuro deficits. Stroke work up completed.  hypertension  Diabetes, HgbA1c 10.3, goal < 7.0 Hyperlipidemia, LDL 106, on lipitor 40 mg daily PTA, now on lipitor 40 mg daily, goal LDL < 70 for diabetics NICM w/ CHF, EF 25% 06/28/2013. Needs life vest and possible ICD implantation CAD - non obstructive  dz, managed medically 06/28/2013  etoh use  Cocaine use  Tobacco use  Probable obstructive sleep apnea   Unable to complete TEE yesterday due to hypotension.   Hospital day # 5  TREATMENT/PLAN  Continue heparin for secondary stroke prevention for now.  May consider long term anticoagulation if clot noted on TEE but overall not a good anticoagulation candidate given cocaine abuse and medical noncompliance. If not clot seen, recommend aspirin 325 mg daily.  Cardiology holding BB, TEE for today.   OP testing for obstructive sleep apnea. Dr. Leonie Man can arrange during f/u with him    Burnetta Sabin, MSN, RN, ANVP-BC, ANP-BC, GNP-BC [facility] Pager: [phone] 07/06/2013 10:09 AM  I have personally obtained a history, examined  the patient, evaluated imaging results, and formulated the assessment and plan of care. I agree with the above.  Antony Contras, MD    To contact Stroke Continuity provider, please refer to [email or web address] After hours, contact General Neurology

## 2013-07-06 NOTE — Progress Notes (Signed)
Family Medicine Teaching Service Daily Progress Note Intern Pager: [phone]  Patient name: Joseph [name] Medical record number: [id number] Date of birth: 1965/05/07 Age: 48 y.o. Gender: male  Primary Care Provider: No PCP Per Patient Consultants: Neurology  Code Status: Full   Pt Overview and Major Events to Date:  4/30: admitted for TIA/stroke, discharged on 4/30 with lifevest  5/2: MRI with multifocal acute infarct  5/4: hypotensive so TEE scheduled for 5/5   Assessment and Plan: Joseph [name] is a 48 y.o. male presenting with right sided weakness . PMH is significant for CHF, DM, Tobacco abuse, Cocaine abuse   # Acute infarction Stroke: symptoms have resolved.   Patient has significant risk factors including smoking, hyperlipidemia and diabetes and NICM with EF of less than or equal to 15%. No deficit on exam.   -  TEE: scheduled for today    - further anticoagulation will be determined on this test    - lipitor 80mg  daily - Carotid dopplers: 1-39%  - Therapy Recommendations: SLP/PT no follow up.  - Neurology recommendations: Heparin anticoagulation, TEE (if + for clot will need long term anticoag but overall poor candidate)  # NICM w/ HFrEF: s/p cath with EF of 15%. Home with life vest. TTE on 4/28 with no evidence of thrombus.  - Continue life vest - holding metoprolol, furosemide>>due to Hypotension - cardiology following  # Elevated troponin. Resolved.   - EKG: sinus tachycardia, left atrial enlargement and unchanged from previous   # Diabetes mellitus, type 2: last A1C of 10.3  - Will hold metformin while inpatient - SSI moderate; CBG's TIDAC and QHS, CBG's well controlled at present  # Tobacco abuse:  - Nicotine patch  # Cocaine abuse:  - frank discussion on 5/2 about the need for stopping cocaine when he goes home.  Currently denying prior use to Attending.    FEN/GI: heart healthy/carb modified  Prophylaxis: Heparin  Disposition: pending TEE,   Subjective:  Patient laying in bed with no complaints. He is eating and drinking ok and using the bathroom with no problems. He hasn't had any re-occurrences of his problems.   Objective: Temp:  [97.3 F (36.3 C)-99.1 F (37.3 C)] 97.6 F (36.4 C) (05/05 0600) Pulse Rate:  [88-101] 94 (05/05 0600) Resp:  [15-20] 18 (05/05 0600) BP: (89-108)/(58-73) 93/65 mmHg (05/05 0600) SpO2:  [96 %-99 %] 98 % (05/05 0600) Physical Exam: General: Laying in bed in no acute distress,  HEENT: EOMI  Cardiovascular: Regular rate and rhythm, no murmur  Respiratory: Clear to auscultation bilaterally via anterior auscultation  Extremities: No edema Skin: Warm, well perfused  Neuro: Alert and oriented.  Laboratory:  Recent Labs Lab 07/04/13 0833 07/05/13 0403 07/06/13 0553  WBC 9.6 9.3 8.7  HGB 16.3 15.4 15.9  HCT 46.3 44.5 46.2  PLT 177 182 182    Recent Labs Lab 07/01/13 2150 07/02/13 0511 07/04/13 0833  NA 137 138 137  K 4.2 4.0 4.2  CL 100 102 102  CO2 21 21 19   BUN 25* 19 19  CREATININE 1.13 0.97 1.00  CALCIUM 9.4 9.0 9.2  PROT  --  6.3  --   BILITOT  --  0.6  --   ALKPHOS  --  42  --   ALT  --  20  --   AST  --  22  --   GLUCOSE 172* 175* 139*       Recent Labs Lab 07/02/13 0511 07/02/13 1118 07/02/13 1600  07/02/13 2152 07/03/13 0430  TROPONINI 0.33* 0.30* 0.34* 0.31* <0.30    Recent Labs Lab 07/05/13 0648 07/05/13 1225 07/05/13 1659 07/05/13 2109 07/06/13 0650  GLUCAP 158* 200* 101* 167* 150*     Imaging/Diagnostic Tests: CT head: IMPRESSION: 1. No acute intracranial abnormality. 2. Remote right frontal lobe infarct. 3. Mild left sphenoid and ethmoidal sinus disease.   MRI/MRA 5/2: Multifocal areas of acute infarction, with a greater than 3 cm infarct involving left frontal opercular cortex and subcortical white matter. No acute hemorrhage.  No intracranial anterior circulation flow reducing lesion is evident. Absent V4 left vertebral could be acute or  chronic.   Rosemarie Ax, MD 07/06/2013, 7:55 AM PGY-1, [place] Intern pager: [phone], text pages welcome

## 2013-07-06 NOTE — Progress Notes (Signed)
FMTS Attending Note  I personally saw and evaluated the patient. The plan of care was discussed with the resident team. I agree with the assessment and plan as documented by the resident.   TEE - negative for atrial/heart thrombus.  Disposition - arrange discharge later today with Neurology/Cardiology/and Family Practice follow up.  Dossie Arbour MD

## 2013-07-06 NOTE — Telephone Encounter (Signed)
CSW referred to assist patient with obtaining an orange card. CSW left message for return call. Raquel Sarna, [place]

## 2013-07-06 NOTE — Progress Notes (Signed)
Tolerated the TEE well. Needs to resume the heart failure therapy that was held to allow procedure to be done. Will resume metoprolol 12.5 mg BID and furosemide 40 mg daily.

## 2013-07-06 NOTE — Discharge Instructions (Signed)
Ischemic Stroke Blood carries oxygen to all areas of your body. A stroke happens when your blood does not flow to your brain like normal. If this happens, your brain will not get the oxygen it needs and brain tissue will die. This is an emergency. Problems (symptoms) of a stroke usually happen suddenly. You may notice them when you wake up. They can include:  Loss of feeling or weakness on one side of the body (face, arm, leg).  Feeling confused.  Trouble talking or understanding.  Trouble seeing.  Trouble walking.  Feeling dizzy.  Loss of balance or coordination.  Severe headache without a cause.  Trouble reading or writing. Get help within 3 4 hours of when your problems first started. If you do not know when your problems started, get help as soon as you can. This is important.  RISK FACTORS  Risk factors are things that make it more likely for you to have a stroke. These things include:  High blood pressure (hypertension).  High cholesterol.  Diabetes.  Heart disease.  Having a buildup of fatty deposits in the blood vessels.  Having an abnormal heart rhythm (atrial fibrillation).  Being very overweight (obese).  Smoking.  Taking birth control pills, especially if you smoke.  Not being active.  Having a diet high in fats, salt, and calories.  Drinking too much alcohol.  Using illegal drugs.  Being African American.  Being over the age of 71.  Having a family history of stroke.  Having a history of blood clots, stroke, warning stroke (transient ischemic attack, TIA), or heart attack.  Sickle cell disease. HOME CARE  Take all medicines exactly as told by your doctor. Understand all your medicine instructions.  You may need to take aspirin or warfarin medicine. Take warfarin exactly as told.  Taking too much or too little warfarin is dangerous. Get regular blood tests as told, including the PT and INR tests. The test results help your doctor adjust  your dose of warfarin. Your PT and INR levels must be done as often as told by your doctor.  Food can cause problems with warfarin and affect the results of your blood tests. This is true for foods high in vitamin K, such as spinach, kale, broccoli, cabbage, collard and turnip greens, brussels sprouts, peas, cauliflower, seaweed, and parsley, as well as beef and pork liver, green tea, and soybean oil. Eat the same amount of food high in vitamin K. Avoid major changes in your diet. Tell your doctor before changing your diet. Talk to a food specialist (dietitian) if you have questions.  Many medicines can cause problems with warfarin and affect your PT and INR test results. Tell your doctor about all medicines you take. This includes vitamins and dietary pills (supplements). Be careful with aspirin and medicines that relieve redness, soreness, and puffiness (inflammation). Do not take or stop medicines unless your doctor tells you to.  Warfarin can cause a lot of bruising or bleeding. Hold pressure over cuts for longer than normal. Talk to your doctor about other side effects of warfarin.  Avoid sports or activities that may cause injury or bleeding.  Be careful when you shave, floss your teeth, or use sharp objects.  Avoid alcoholic drinks or drink very little alcohol while taking warfarin. Tell your doctor if you change how much alcohol you drink.  Tell your dentist and other doctors that you take warfarin before procedures.  If you are able to swallow, eat healthy foods. Eat 5  or more servings of fruits and vegetables a day. Eat soft foods, pureed foods, or eat small bites of food so you do not choke.  Follow your diet program as told, if you are given one.  Keep a healthy weight.  Stay active. Try to get at least 30 minutes of activity on most or all days.  Do not smoke.  Limit how much alcohol you drink even if you are not taking warfarin. Moderate alcohol use is:  No more than 2  drinks each day for men.  No more than 1 drink each day for women who are not pregnant.  Stop abusing drugs.  Keep your home safe so you do not fall. Try:  Putting grab bars in the bedroom and bathroom.  Raising toilet seats.  Putting a seat in the shower.  Go to therapy sessions (physical, occupational, and speech) as told by your doctor.  Use a walker or cane at all times if told to do so.  Keep all doctor visits as told. GET HELP IF:  Your personality changes.  You have trouble swallowing.  You are seeing two of everything.  You are dizzy.  You have a fever.  Your skin starts to break down. GET HELP RIGHT AWAY IF:  The symptoms below may be a sign of an emergency. Do not wait to see if the symptoms go away. Call for help (911 in [place] Do not drive yourself to the hospital.  You have sudden weakness or numbness on the face, arm, or leg (especially on one side of the body).  You have sudden trouble walking or moving your arms or legs.  You have sudden confusion.  You have trouble talking or understanding.  You have sudden trouble seeing in one or both eyes.  You lose your balance or your movements are not smooth.  You have a sudden, severe headache with no known cause.  You have new chest pain or you feel your heart beating in a unsteady way.  You are partly or totally unaware of what is going on around you. Document Released: 02/07/2011 Document Revised: 10/21/2012 Document Reviewed: 09/29/2011 [facility] Patient Information 2014 [place].

## 2013-07-06 NOTE — Progress Notes (Signed)
Patient discharged at this time. Educated on the warning signs of stroke and the ned to follow up with the MD. esucated on his risks factors and to try to quitting smoking. All questions answered and all assessments remained unchanged prior to discharge.

## 2013-07-06 NOTE — CV Procedure (Signed)
     Transesophageal Echocardiogram Note  HADES MATHEW [id number] [birth year]  Procedure: Transesophageal Echocardiogram Indications: CVA  Procedure Details Consent: Obtained Time Out: Verified patient identification, verified procedure, site/side was marked, verified correct patient position, special equipment/implants available, Radiology Safety Procedures followed,  medications/allergies/relevent history reviewed, required imaging and test results available.  Performed  Medications: Fentanyl: 4 mg Versed: 50 mcg  Left Ventrical:  There was mild concentric hypertrophy. The estimated ejection fraction was 20%. Severe global hypokinesis. No thrombus seen in the LV cavity.   Mitral Valve: Trace MR, no vegetation.   Aortic Valve: Normal, no AI, no vegetation.   Tricuspid Valve: Trace TR, no vegetation.   Pulmonic Valve: Normal, no PR, no vegetation.   Left Atrium/ Left atrial appendage: No thrombus in the LA or LAA. Normal filling and emptying velocities > 40 cm/sec. Left atrium is moderately dilated. There is evidence of spontaneous echocontrast (smoke) in the left atrium.   Atrial septum: No evidence of PFO or ASD.   Aorta: Mild AS plague.   Complications: No apparent complications Patient did tolerate procedure well.  [name], MD, [facility] 07/06/2013, 7:43 AM

## 2013-07-06 NOTE — H&P (View-Only) (Signed)
Stroke Team Progress Note  HISTORY Joseph [name] is a 48 y.o. male with a history of nonischemic cardiomyopathy that was recently diagnosed. His ejection fraction was found to be 20%. He was discharged from the hospital 07/02/2013. At home, he developed right-sided weakness and some difficulty understanding people. His wife found him slumped over, and at the time EMS arrival he was noted to have a right hemiparesis. Family estimated that this lasted for a little bit more than 10 minutes. When seen by Dr. Leonel Ramsay his deficits had completely resolved. LKW: 07/02/2013 at 8 PM Patient was not administered TPA secondary to resolved symptoms. He was admitted for further evaluation and treatment.  SUBJECTIVE   OBJECTIVE Most recent Vital Signs: Filed Vitals:   07/05/13 0223 07/05/13 0535 07/05/13 1001 07/05/13 1029  BP: 88/66 101/64 89/67 91/65   Pulse: 82 80  93  Temp: 97.4 F (36.3 C) 98.9 F (37.2 C)  98.8 F (37.1 C)  TempSrc: Oral Oral  Oral  Resp: 18 18 15 20   Height:      Weight:      SpO2: 100% 98% 96% 96%   CBG (last 3)   Recent Labs  07/04/13 1659 07/04/13 2214 07/05/13 0648  GLUCAP 244* 248* 158*    IV Fluid Intake:   . sodium chloride 20 mL/hr at 07/05/13 1045  . heparin 1,100 Units/hr (07/04/13 1449)    MEDICATIONS  . atorvastatin  80 mg Oral q1800  . insulin aspart  0-15 Units Subcutaneous TID WC  . living well with diabetes book   Does not apply Once  . nicotine  21 mg Transdermal Daily  . sodium chloride  3 mL Intravenous Q12H   PRN:    Diet:  Cardiac thin liquids Activity:  Ambulate with assistance DVT Prophylaxis:  Heparin drip  CLINICALLY SIGNIFICANT STUDIES Basic Metabolic Panel:   Recent Labs Lab 07/01/13 0629 07/01/13 2150 07/02/13 0511 07/04/13 0833  NA 139 137 138 137  K 4.1 4.2 4.0 4.2  CL 103 100 102 102  CO2 22 21 21 19   GLUCOSE 144* 172* 175* 139*  BUN 22 25* 19 19  CREATININE 1.02 1.13 0.97 1.00  CALCIUM 9.2 9.4 9.0 9.2  MG  --   1.8  --   --    Liver Function Tests:   Recent Labs Lab 07/02/13 0511  AST 22  ALT 20  ALKPHOS 42  BILITOT 0.6  PROT 6.3  ALBUMIN 3.2*   CBC:  Recent Labs Lab 07/01/13 2150  07/04/13 0833 07/05/13 0403  WBC 11.4*  < > 9.6 9.3  NEUTROABS 5.0  --   --   --   HGB 15.9  < > 16.3 15.4  HCT 46.2  < > 46.3 44.5  MCV 100.7*  < > 99.6 100.0  PLT 174  < > 177 182  < > = values in this interval not displayed. Coagulation:   Recent Labs Lab 06/29/13 2032  LABPROT 12.7  INR 0.97   Cardiac Enzymes:   Recent Labs Lab 07/02/13 1600 07/02/13 2152 07/03/13 0430  TROPONINI 0.34* 0.31* <0.30   Urinalysis: No results found for this basename: COLORURINE, APPERANCEUR, LABSPEC, PHURINE, GLUCOSEU, HGBUR, BILIRUBINUR, KETONESUR, PROTEINUR, UROBILINOGEN, NITRITE, LEUKOCYTESUR,  in the last 168 hours Lipid Panel    Component Value Date/Time   CHOL 176 07/03/2013 0430   TRIG 137 07/03/2013 0430   HDL 69 07/03/2013 0430   CHOLHDL 2.6 07/03/2013 0430   VLDL 27 07/03/2013 0430   LDLCALC 80  07/03/2013 0430   HgbA1C  Lab Results  Component Value Date   HGBA1C 10.3* 06/28/2013    Urine Drug Screen:     Component Value Date/Time   LABOPIA NONE DETECTED 07/01/2013 2326   COCAINSCRNUR NONE DETECTED 07/01/2013 2326   LABBENZ NONE DETECTED 07/01/2013 2326   AMPHETMU NONE DETECTED 07/01/2013 2326   THCU NONE DETECTED 07/01/2013 2326   LABBARB NONE DETECTED 07/01/2013 2326    Alcohol Level:   Recent Labs Lab 06/28/13 2045 07/02/13 1118  ETH <11 <11    CT of the brain  07/01/2013    1. No acute intracranial abnormality. 2. Remote right frontal lobe infarct. 3. Mild left sphenoid and ethmoidal sinus disease.    MRI of the brain 07/03/2013 Multifocal areas of acute infarction, with a greater than 3 cm infarct involving left frontal opercular cortex and subcortical white matter. No acute hemorrhage.  MRA of the brain  07/03/2013 No intracranial anterior circulation flow reducing lesion is  evident. Absent V4 left vertebral could be acute or chronic.  2D Echocardiogram  06/29/2013 EF 20% with no source of embolus. Severe global hypokinesis.  Carotid Doppler  No evidence of hemodynamically significant internal carotid artery stenosis. Vertebral artery flow is antegrade.   CXR  06/28/2013 Mild enlargement of cardiac silhouette. Suspected mild pulmonary edema though infection not entirely  Excluded.   EKG  sinus bradycardia. For complete results please see formal report.   Therapy Recommendations - no followup physical therapy but intermittent supervision recommended.  Physical Exam   Obese young african [place] male not in distress.Awake alert. Afebrile. Head is nontraumatic. Neck is supple without bruit. Hearing is normal. Cardiac exam no murmur or gallop. Lungs are clear to auscultation. Distal pulses are well felt. Neurological Exam ;  Awake  Alert oriented x 3. Normal speech and language.eye movements full without nystagmus.fundi were not visualized. Vision acuity and fields appear normal. Hearing is normal. Palatal movements are normal. Face symmetric. Tongue midline. Normal strength, tone, reflexes and coordination. Normal sensation. Gait deferred.   ASSESSMENT [name] is a 48 y.o. male presenting with transient right sided weakness. MRI completed showing multifocal areas of acute infarction with largest in left frontal opercular cortex. Stroke felt to be embolic secondary to known cardiomyopathy.  On aspirin 81 mg orally every day prior to admission. Now on heparin for secondary stroke prevention. Patient with no resultant  Neuro deficits. Stroke work up completed.  hypertension  Diabetes, HgbA1c 10.3, goal < 7.0 Hyperlipidemia, LDL 106, on lipitor 40 mg daily PTA, now on lipitor 40 mg daily, goal LDL < 70 for diabetics NICM w/ CHF, EF 25% 06/28/2013. Needs life vest and possible ICD implantation CAD - non obstructive dz, managed medically 06/28/2013  etoh  use  Cocaine use  Tobacco use  Probable obstructive sleep apnea   Unable to complete TEE today due to hypotension.  Hospital day # 4  TREATMENT/PLAN  Continue heparin for secondary stroke prevention for now.  May consider long term anticoagulation if clot noted on TEE but overall not a good anticoagulation candidate given cocaine abuse and medical noncompliance.   Cardiology holding BB for now and rescheduled TEE for tomorrow.   OP testing for obstructive sleep apnea. Dr. Leonie Man can arrange during f/u with him    Burnetta Sabin, MSN, RN, ANVP-BC, ANP-BC, GNP-BC [facility] Pager: [phone] 07/05/2013 5:14 PM  I have personally obtained a history, examined the patient, evaluated imaging results, and formulated the assessment and  plan of care. I agree with the above.  Antony Contras, MD    To contact Stroke Continuity provider, please refer to [email or web address] After hours, contact General Neurology

## 2013-07-06 NOTE — Progress Notes (Signed)
Pt sent down to Endo via bed and IV heparin at 11 cc infusing

## 2013-07-06 NOTE — Progress Notes (Signed)
  Echocardiogram Echocardiogram Transesophageal has been performed.  Joseph [name] 07/06/2013, 12:50 PM

## 2013-07-07 ENCOUNTER — Ambulatory Visit (HOSPITAL_COMMUNITY)
Admit: 2013-07-07 | Discharge: 2013-07-07 | Disposition: A | Discharge status: Home / Self Care | Payer: Medicaid Other | Attending: Internal Medicine | Admitting: Internal Medicine

## 2013-07-07 ENCOUNTER — Telehealth: Payer: Self-pay | Admitting: Licensed Clinical Social Worker

## 2013-07-07 ENCOUNTER — Encounter: Payer: Self-pay | Admitting: Family Medicine

## 2013-07-07 ENCOUNTER — Encounter (HOSPITAL_COMMUNITY): Payer: Self-pay | Admitting: Cardiology

## 2013-07-07 ENCOUNTER — Ambulatory Visit (INDEPENDENT_AMBULATORY_CARE_PROVIDER_SITE_OTHER): Payer: Medicaid Other | Admitting: Family Medicine

## 2013-07-07 VITALS — BP 112/75 | HR 99 | Temp 98.5°F | Ht 64.0 in | Wt 174.0 lb

## 2013-07-07 VITALS — BP 111/79 | HR 95 | Resp 18 | Wt 174.4 lb

## 2013-07-07 DIAGNOSIS — R0989 Other specified symptoms and signs involving the circulatory and respiratory systems: Secondary | ICD-10-CM | POA: Insufficient documentation

## 2013-07-07 DIAGNOSIS — I6350 Cerebral infarction due to unspecified occlusion or stenosis of unspecified cerebral artery: Secondary | ICD-10-CM | POA: Diagnosis not present

## 2013-07-07 DIAGNOSIS — I5022 Chronic systolic (congestive) heart failure: Secondary | ICD-10-CM | POA: Diagnosis not present

## 2013-07-07 DIAGNOSIS — I428 Other cardiomyopathies: Secondary | ICD-10-CM | POA: Insufficient documentation

## 2013-07-07 DIAGNOSIS — Z8673 Personal history of transient ischemic attack (TIA), and cerebral infarction without residual deficits: Secondary | ICD-10-CM | POA: Insufficient documentation

## 2013-07-07 DIAGNOSIS — R0609 Other forms of dyspnea: Secondary | ICD-10-CM | POA: Insufficient documentation

## 2013-07-07 DIAGNOSIS — F1910 Other psychoactive substance abuse, uncomplicated: Secondary | ICD-10-CM | POA: Insufficient documentation

## 2013-07-07 DIAGNOSIS — I509 Heart failure, unspecified: Secondary | ICD-10-CM

## 2013-07-07 DIAGNOSIS — I10 Essential (primary) hypertension: Secondary | ICD-10-CM | POA: Diagnosis not present

## 2013-07-07 DIAGNOSIS — I639 Cerebral infarction, unspecified: Secondary | ICD-10-CM

## 2013-07-07 DIAGNOSIS — IMO0001 Reserved for inherently not codable concepts without codable children: Secondary | ICD-10-CM | POA: Diagnosis not present

## 2013-07-07 DIAGNOSIS — E1165 Type 2 diabetes mellitus with hyperglycemia: Secondary | ICD-10-CM

## 2013-07-07 DIAGNOSIS — IMO0002 Reserved for concepts with insufficient information to code with codable children: Secondary | ICD-10-CM

## 2013-07-07 DIAGNOSIS — Z794 Long term (current) use of insulin: Secondary | ICD-10-CM

## 2013-07-07 DIAGNOSIS — I6529 Occlusion and stenosis of unspecified carotid artery: Secondary | ICD-10-CM | POA: Insufficient documentation

## 2013-07-07 DIAGNOSIS — I5043 Acute on chronic combined systolic (congestive) and diastolic (congestive) heart failure: Secondary | ICD-10-CM

## 2013-07-07 DIAGNOSIS — F17200 Nicotine dependence, unspecified, uncomplicated: Secondary | ICD-10-CM | POA: Insufficient documentation

## 2013-07-07 DIAGNOSIS — I2510 Atherosclerotic heart disease of native coronary artery without angina pectoris: Secondary | ICD-10-CM | POA: Insufficient documentation

## 2013-07-07 DIAGNOSIS — E119 Type 2 diabetes mellitus without complications: Secondary | ICD-10-CM | POA: Insufficient documentation

## 2013-07-07 DIAGNOSIS — G459 Transient cerebral ischemic attack, unspecified: Secondary | ICD-10-CM

## 2013-07-07 MED ORDER — METFORMIN HCL 500 MG PO TABS: 1000.0000 mg | ORAL_TABLET | Freq: Two times a day (BID) | ORAL | Status: DC

## 2013-07-07 MED ORDER — ATORVASTATIN CALCIUM 80 MG PO TABS: 80.0000 mg | ORAL_TABLET | Freq: Every day | ORAL | Status: DC

## 2013-07-07 MED ORDER — GLUCOSE BLOOD VI STRP: ORAL_STRIP | Status: DC

## 2013-07-07 MED ORDER — CARVEDILOL 6.25 MG PO TABS: 6.2500 mg | ORAL_TABLET | Freq: Two times a day (BID) | ORAL | Status: DC

## 2013-07-07 MED ORDER — LISINOPRIL 5 MG PO TABS: ORAL_TABLET | ORAL | Status: DC

## 2013-07-07 MED ORDER — RELION PRIME MONITOR DEVI: Status: DC

## 2013-07-07 MED ORDER — RELION LANCETS ULTRA-THIN 30G MISC: Status: AC

## 2013-07-07 MED ORDER — GLIMEPIRIDE 2 MG PO TABS: 2.0000 mg | ORAL_TABLET | Freq: Every day | ORAL | Status: DC

## 2013-07-07 NOTE — Telephone Encounter (Signed)
CSW referred to assist with application for orange card. CSW left message again for patient with no return call. CSW left information on orange card application process with provider in clinic for patient as he has scheduled clinic visit today. CSW available if further assistance needed. Raquel Sarna, [place]

## 2013-07-07 NOTE — Discharge Summary (Signed)
I agree with the discharge summary as documented.   Kyle Fletke MD  

## 2013-07-07 NOTE — Progress Notes (Addendum)
PCP:   HPI: Mr. Trulson is a 48 yo male with a history of chronic systolic HF, NICM, HTN, TIA, stroke, DM2,  tobacco abuse and polysubstance abuse.   Has been admitted 2 times in the past month for SOB and right sided weakness and slurred speech. Discharged 07/06/13 and last weight on chart 180 lbs.   R/LHC 06/30/13: RA: 6  RV: 26/9  PA: 26/13  PC: a 18 mean 13 CO/CI Fick: 3.7/2.0 1) Mild CAD with 30% narrowing in mid AV groove circumflex, and 30% proximal and 40% mid RCA stenosis with otherwise normal LAD and ramus  EF: 20%, mild MR, no embolus (06/2013) MRI head: acute infraction with > 3 cm infarct involving L frontal opercular cortex and subcortical white matter (07/2013)   [facility] F/U: Discharged from the hospital yesterday. Denies SOB, orthopnea, PND or CP. Reports he is is trying to follow low salt diet and drink less than 2L a day. Has all of his medications at home and reports right now no issues affording medications. Wearing LifeVest and no alarms.   SH: No smoking, no ETOH and no drugs as of 06/28/13. 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  PMH:  1) HTN 2) NICM: LHC (06/2013): Mild coronary obstructive disease with 30% narrowing in the mid AV groove circumflex, and 30% proximal and 40% mid RCA stenoses with otherwise normal LAD and ramus intermediate vessel 3) Chronic systolic HF: RHC (08/2829) RA: 6, RV: 26/9, PA: 26/13, PC: a 18 mean 13, CO/CI Fick: 3.7/2.0, left ventriculography EF 15%; ECHO (06/2013) EF 20%, diffuse HK, mild MR; TEE (07/2013) no thrombus 4) DM2 5) Stroke: MRI (07/2013) revealed an acute infarction, with a greater than 3 cm infarct involving left frontal opercular cortex and subcortical white matter. Carotid dopplers (07/2013) mild soft plaque origin ICA. 1-39% ICA stenosis. Vertebral artery flow is antegrade 6) Polysubstance  abuse: + cocaine UDS   Current Outpatient Prescriptions  Medication Sig Dispense Refill  . aspirin EC 81 MG EC tablet Take 1 tablet (81 mg total) by mouth daily.  30 tablet  0  . atorvastatin (LIPITOR) 40 MG tablet Take 1 tablet (40 mg total) by mouth daily at 6 PM.  30 tablet  0  . Blood Glucose Monitoring Suppl ([place]) DEVI Patient will need to test his blood glucose once daily prior to breakfast and metformin and amaryl administration.  1 Device  0  . furosemide (LASIX) 40 MG tablet Take 1 tablet (40 mg total) by mouth daily.  30 tablet  0  . glimepiride (AMARYL) 2 MG tablet Take 1 tablet (2 mg total) by mouth daily with breakfast.  30 tablet  1  . glucose blood test strip Use as instructed  100 each  12  . metFORMIN (GLUCOPHAGE) 500 MG tablet Take 2 tablets (1,000 mg total) by mouth 2 (two) times daily with a meal. Start taking on 07/02/13. Due to CATH (dye load)  procedure 4/28.  60 tablet  0  . metoprolol tartrate (LOPRESSOR) 12.5 mg TABS tablet Take 0.5 tablets (12.5 mg total) by mouth 2 (two) times daily.  60 tablet  0  . ReliOn Ultra Thin Lancets MISC Patient will need to test his blood glucose once daily prior to breakfast and metformin and amaryl administration.  100 each  2  No current facility-administered medications for this encounter.     Filed Vitals:   07/07/13 1426  BP: 111/79  Pulse: 95  Resp: 18  Weight: 174 lb 6 oz (79.096 kg)  SpO2: 100%    PHYSICAL EXAM: General:  Well appearing. No resp difficulty HEENT: normal Neck: supple. JVP flat. Carotids 2+ bilaterally; no bruits. No lymphadenopathy or thryomegaly appreciated. Cor: PMI normal. Regular rate & rhythm. No rubs, gallops or murmurs. Lungs: clear Abdomen: soft, nontender, nondistended. No hepatosplenomegaly. No bruits or masses. Good bowel sounds. Extremities: no cyanosis, clubbing, rash, edema Neuro: alert & orientedx3, cranial nerves grossly intact. Moves all 4 extremities w/o difficulty.  Affect pleasant.   ASSESSMENT & PLAN:  1) Chronic systolic HF: NICM, EF 99% (06/2013) - Reviewed last two discharge summaries and patient is newly diagnosed HF with EF 20%.  - NYHA II symptoms and volume status stable. Will continue lasix 40 mg daily. - Stop metoprolol tartrate and start coreg 6.25 mg BID. - Will start lisinopril 5 mg q pm. Discussed the need to follow up so we can follow kidney function. - Continue lifevest and will need repeat ECHO in August 2015 to reassess EF. If remains less than 35% will refer to EP for ICD. QRS narrow, no CRT-D. - Discussed in depth the role of the HF clinic and the need to frequent follow up. Informed the patient on the importance of daily weights (provide him with scale and weight chart), following low salt diet, and restricting fluids to less than 2L a day. 2) Non-obstructive CAD - Will increase atorvastatin to 80 mg daily with recent stroke. Continue BB and ASA. 3) HTN - Controlled. As above change metorpolol to coreg and start lisinopril. 4) ?OSA - Will eventually need sleep study. Patient reports he snores will set up in future visit. 5) Polysubstance abuse - Discussed the need to abstain from ETOH, cocaine and tobacco.  6) Stroke (07/2013) - acute infraction with > 3 cm infarct involving L frontal opercular cortex and subcortical white matter. As above will increase statin to 80 mg daily. Continue ASA.   Will send referral to CSW to contact patient about trying to help him with [facility] and applying for Medicaid. Provided medications for patient today through the HF assistance program.  Deatra Canter B CosgroveNP-C 9:10 PM

## 2013-07-07 NOTE — Patient Instructions (Signed)
Will stop metoprolol tartrate.  Start coreg 6.25 mg twice a day. Call the clinic if any issues or dizziness.  Start lisinopril 5 mg at night.  Increase your atorvastatin to 80 mg at night. You currently have 40 mg tablets so you will take 2 tablets daily and when you get your new refill then you will have the 80 mg tablet and take 1 tablet nightly.  Bring all your medications to all your visits.  F/U 2 weeks with BMET  Do the following things EVERYDAY: 1) Weigh yourself in the morning before breakfast. Write it down and keep it in a log. 2) Take your medicines as prescribed 3) Eat low salt foods-Limit salt (sodium) to 2000 mg per day.  4) Stay as active as you can everyday 5) Limit all fluids for the day to less than 2 liters 6)

## 2013-07-07 NOTE — Patient Instructions (Signed)
It was great seeing you today.   1. Please refer to your medication list for the medications you should be taking now.  2. Check you blood sugar every morning and two hours after your biggest meal and bring your meter or written copy of your blood sugar reading to your next doctor's visit.    Please bring all your medications to every doctors visit  Sign up for My Chart to have easy access to your labs results, and communication with your Primary care physician.  Next Appointment  Please call to make an appointment with Dr Berkley Harvey in 2 weeks for diabetes check-up   I look forward to talking with you again at our next visit. If you have any questions or concerns before then, please call the clinic at [phone].  Take Care,   Dr Phill Myron

## 2013-07-08 ENCOUNTER — Encounter: Payer: Self-pay | Admitting: Family Medicine

## 2013-07-08 NOTE — Assessment & Plan Note (Deleted)
Currently no residual symptoms/signs - Continue ASA and Lipitor 80mg qhs 

## 2013-07-08 NOTE — Assessment & Plan Note (Signed)
BP currently well controlled - Continue ACE-I and Coreg

## 2013-07-08 NOTE — Assessment & Plan Note (Deleted)
-   Wearing lifevest - Scheduled to f/u with Cards in 2 weeks - Current holding diuretic  - Continue current medical management: Coreg, ACE-I, Statin - Possibly need AICD pending repeat assessment of cardiac fcn in future 

## 2013-07-08 NOTE — Assessment & Plan Note (Signed)
Currently no residual symptoms/signs - Continue ASA and Lipitor 80mg  qhs

## 2013-07-08 NOTE — Assessment & Plan Note (Signed)
-   Wearing lifevest - Scheduled to f/u with Cards in 2 weeks - Current holding diuretic  - Continue current medical management: Coreg, ACE-I, Statin - Possibly need AICD pending repeat assessment of cardiac fcn in future

## 2013-07-08 NOTE — Progress Notes (Signed)
  Patient name: Joseph [name] MRN [id number]  Date of birth: 1965/03/29  CC & HPI:  FITZHUGH VIZCARRONDO is a 48 y.o. male presenting today for hospital follow-up.   CVA - Recently discharged with new stroke.  - Currently not endorsing residual symptoms - Advised to increase Lipitor to 80 and take ASA  DIABETES   Blood Sugar Ranges: not check; hasn't gotten meter yet  Symptoms of Hypoglycemia? 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  Medical & Surgical Hx:  Reviewed.  Medications & Allergies: Reviewed & Updated - see associated section Social History: Reviewed:   Objective Findings:  Vitals: BP 112/75  Pulse 99  Temp(Src) 98.5 F (36.9 C) (Oral)  Ht 5\' 4"  (1.626 m)  Wt 174 lb (78.926 kg)  BMI 29.85 kg/m2  Gen: NAD CV: RRR w/o m/r/g, pulses +2 b/l Resp: CTAB w/ normal respiratory effort LE: No edema Neuro: A&Ox4; Short & Long term memory intact; CN 3-12 intact; Gross Sensory & Motor intact  Assessment & Plan:   Please See Problem Focused Assessment & Plan

## 2013-07-08 NOTE — Assessment & Plan Note (Signed)
Currently not check CBGs due to not getting meter yet, and has started Amaryl or inc metformin - Advised increasing Metformin to 1,000mg  BID; Starting Amaryl and checking CBGs qam and 2hrs post largest meals - Return to clinic in 2 weeks to assess CBGs and adjust medication as needed

## 2013-07-13 ENCOUNTER — Ambulatory Visit: Payer: Self-pay

## 2013-07-20 NOTE — Progress Notes (Addendum)
Patient ID: Joseph [name], male   DOB: 1965/07/01, 48 y.o.   MRN: [id number]  PCP: Dr. Berkley Harvey [facility])  HPI: Joseph [name] is a 48 yo male with a history of chronic systolic HF, NICM, HTN, TIA, stroke, DM2,  tobacco abuse and polysubstance abuse.   Has been admitted 2 times in the past month for SOB and right sided weakness and slurred speech. Discharged 07/06/13 and last weight on chart 180 lbs.   R/LHC 06/30/13: RA: 6  RV: 26/9  PA: 26/13  PC: a 18 mean 13 CO/CI Fick: 3.7/2.0 1) Mild CAD with 30% narrowing in mid AV groove circumflex, and 30% proximal and 40% mid RCA stenosis with otherwise normal LAD and ramus  EF: 20%, mild MR, no embolus (06/2013) MRI head: acute infraction with > 3 cm infarct involving L frontal opercular cortex and subcortical white matter (07/2013)  Follow up: Last visit changed metoprolol to coreg 6.25 mg BID and started lisinopril 5 mg q pm. We also increased atorvastatin to 80 mg daily with recent stroke. Doing well. Denis SOB, PND, orthopnea, CP or edema. Taking medications as prescribed. No dizziness. Wearing LifeVest and no alarms. Following a low salt diet and drinking less than 2L a day. Weight at home 168-170 lbs. Able to go around the whole grocery store without stopping and can go upstairs. Mowed the lawn the other day with no issues. Has not used any drugs or smoked.     SH: No smoking, no ETOH and no drugs as of 06/28/13. 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  PMH:  1) HTN 2) NICM: LHC (06/2013): Mild coronary obstructive disease with 30% narrowing in the mid AV groove circumflex, and 30% proximal and 40% mid RCA stenoses with otherwise normal LAD and ramus intermediate vessel 3) Chronic systolic HF: RHC (03/4780) RA: 6, RV: 26/9, PA: 26/13, PC: a 18 mean 13, CO/CI Fick: 3.7/2.0, left ventriculography EF 15%; ECHO  (06/2013) EF 20%, diffuse HK, mild MR; TEE (07/2013) no thrombus 4) DM2 5) Stroke: MRI (07/2013) revealed an acute infarction, with a greater than 3 cm infarct involving left frontal opercular cortex and subcortical white matter. Carotid dopplers (07/2013) mild soft plaque origin ICA. 1-39% ICA stenosis. Vertebral artery flow is antegrade 6) Polysubstance abuse: + cocaine UDS   Current Outpatient Prescriptions  Medication Sig Dispense Refill  . aspirin EC 81 MG EC tablet Take 1 tablet (81 mg total) by mouth daily.  30 tablet  0  . atorvastatin (LIPITOR) 80 MG tablet Take 1 tablet (80 mg total) by mouth daily at 6 PM.  30 tablet  3  . Blood Glucose Monitoring Suppl ([place]) DEVI Patient will need to test his blood glucose once daily prior to breakfast and metformin and amaryl administration.  1 Device  0  . carvedilol (COREG) 6.25 MG tablet Take 1 tablet (6.25 mg total) by mouth 2 (two) times daily with a meal.  60 tablet  3  . furosemide (LASIX) 40 MG tablet Take 1 tablet (40 mg total) by mouth daily.  30 tablet  0  . glimepiride (AMARYL) 2 MG tablet Take 1 tablet (2 mg total) by mouth daily with breakfast.  30 tablet  1  . glucose blood test strip Use as instructed  100 each  12  . lisinopril (  PRINIVIL,ZESTRIL) 5 MG tablet Take 5 mg (1 tablet) in the evening.  30 tablet  3  . metFORMIN (GLUCOPHAGE) 500 MG tablet Take 2 tablets (1,000 mg total) by mouth 2 (two) times daily with a meal. Start taking on 07/02/13. Due to CATH (dye load)  procedure 4/28.  60 tablet  0  . ReliOn Ultra Thin Lancets MISC Patient will need to test his blood glucose once daily prior to breakfast and metformin and amaryl administration.  100 each  2   No current facility-administered medications for this encounter.     Filed Vitals:   07/21/13 1010  BP: 102/65  Pulse: 97  Resp: 18  Weight: 175 lb 2 oz (79.436 kg)  SpO2: 99%    PHYSICAL EXAM: General:  Well appearing. No resp difficulty HEENT:  normal Neck: supple. JVP flat. Carotids 2+ bilaterally; no bruits. No lymphadenopathy or thryomegaly appreciated. Cor: PMI normal. Regular rate & rhythm. No rubs, gallops or murmurs. Lungs: clear Abdomen: soft, nontender, nondistended. No hepatosplenomegaly. No bruits or masses. Good bowel sounds. Extremities: no cyanosis, clubbing, rash, edema Neuro: alert & orientedx3, cranial nerves grossly intact. Moves all 4 extremities w/o difficulty. Affect pleasant.   ASSESSMENT & PLAN:  1) Chronic systolic HF: NICM, EF 32% (06/2013) - NYHA II symptoms and volume status stable. Will continue lasix 40 mg daily. - Increase coreg to 6.25 mg q am and 9.375 mg q pm. Call clinic if any increased dizziness. - Continue lisinopril check BMET today. - May be able to add spiro next visit if K+, Cr and BP stable.  - Continue lifevest and will need repeat ECHO in August 2015 to reassess EF. If remains less than 35% will refer to EP for ICD. QRS narrow, no CRT-D. - Discussed in depth the role of the HF clinic and the need to frequent follow up. Informed the patient on the importance of daily weights (provide him with scale and weight chart), following low salt diet, and restricting fluids to less than 2L a day. 2) Non-obstructive CAD - Continue statin, ASA, ACE-I and BB.  3) HTN - Controlled. As above increase BB for LV dysfunction.  4) ?OSA - Will eventually need sleep study. Patient reports he snores will set up in future visit. 5) Polysubstance abuse - Discussed the need to abstain from ETOH, cocaine and tobacco. Congratulated patient on success. Will provide nicoderm patches through HF fund.  6) Stroke (07/2013) - acute infraction with > 3 cm infarct involving L frontal opercular cortex and subcortical white matter. Continue statin and ASA   Meet with CSW today to help with Medicaid and orange card. Continue to follow with paramedicine program.  F/U 3 weeks Deatra Canter B CosgroveNP-C 10:22 AM

## 2013-07-21 ENCOUNTER — Ambulatory Visit (HOSPITAL_COMMUNITY)
Admission: RE | Admit: 2013-07-21 | Discharge: 2013-07-21 | Disposition: A | Discharge status: Home / Self Care | Payer: Medicaid Other | Source: Ambulatory Visit | Attending: Internal Medicine | Admitting: Internal Medicine

## 2013-07-21 ENCOUNTER — Encounter: Payer: Self-pay | Admitting: Licensed Clinical Social Worker

## 2013-07-21 ENCOUNTER — Encounter (HOSPITAL_COMMUNITY): Payer: Self-pay

## 2013-07-21 VITALS — BP 102/65 | HR 97 | Resp 18 | Wt 175.1 lb

## 2013-07-21 DIAGNOSIS — I6350 Cerebral infarction due to unspecified occlusion or stenosis of unspecified cerebral artery: Secondary | ICD-10-CM

## 2013-07-21 DIAGNOSIS — Z7982 Long term (current) use of aspirin: Secondary | ICD-10-CM | POA: Insufficient documentation

## 2013-07-21 DIAGNOSIS — Z8673 Personal history of transient ischemic attack (TIA), and cerebral infarction without residual deficits: Secondary | ICD-10-CM | POA: Insufficient documentation

## 2013-07-21 DIAGNOSIS — Z87891 Personal history of nicotine dependence: Secondary | ICD-10-CM | POA: Insufficient documentation

## 2013-07-21 DIAGNOSIS — I428 Other cardiomyopathies: Secondary | ICD-10-CM | POA: Insufficient documentation

## 2013-07-21 DIAGNOSIS — E119 Type 2 diabetes mellitus without complications: Secondary | ICD-10-CM | POA: Insufficient documentation

## 2013-07-21 DIAGNOSIS — I5022 Chronic systolic (congestive) heart failure: Secondary | ICD-10-CM | POA: Diagnosis not present

## 2013-07-21 DIAGNOSIS — F17200 Nicotine dependence, unspecified, uncomplicated: Secondary | ICD-10-CM | POA: Insufficient documentation

## 2013-07-21 DIAGNOSIS — I10 Essential (primary) hypertension: Secondary | ICD-10-CM | POA: Insufficient documentation

## 2013-07-21 DIAGNOSIS — F1910 Other psychoactive substance abuse, uncomplicated: Secondary | ICD-10-CM | POA: Insufficient documentation

## 2013-07-21 DIAGNOSIS — I509 Heart failure, unspecified: Secondary | ICD-10-CM | POA: Insufficient documentation

## 2013-07-21 DIAGNOSIS — I2510 Atherosclerotic heart disease of native coronary artery without angina pectoris: Secondary | ICD-10-CM | POA: Insufficient documentation

## 2013-07-21 DIAGNOSIS — IMO0002 Reserved for concepts with insufficient information to code with codable children: Secondary | ICD-10-CM

## 2013-07-21 DIAGNOSIS — F1410 Cocaine abuse, uncomplicated: Secondary | ICD-10-CM | POA: Insufficient documentation

## 2013-07-21 DIAGNOSIS — E1165 Type 2 diabetes mellitus with hyperglycemia: Secondary | ICD-10-CM

## 2013-07-21 DIAGNOSIS — I6529 Occlusion and stenosis of unspecified carotid artery: Secondary | ICD-10-CM | POA: Insufficient documentation

## 2013-07-21 DIAGNOSIS — I639 Cerebral infarction, unspecified: Secondary | ICD-10-CM

## 2013-07-21 DIAGNOSIS — IMO0001 Reserved for inherently not codable concepts without codable children: Secondary | ICD-10-CM | POA: Diagnosis not present

## 2013-07-21 LAB — BASIC METABOLIC PANEL
BUN: 19 mg/dL (ref 6–23)
CO2: 24 mEq/L (ref 19–32)
Calcium: 9.6 mg/dL (ref 8.4–10.5)
Chloride: 105 mEq/L (ref 96–112)
Creatinine, Ser: 1.03 mg/dL (ref 0.50–1.35)
GFR calc non Af Amer: 84 mL/min — ABNORMAL LOW (ref >90)
Glucose, Bld: 177 mg/dL — ABNORMAL HIGH (ref 70–99)
POTASSIUM: 4.3 meq/L (ref 3.7–5.3)
SODIUM: 142 meq/L (ref 137–147)

## 2013-07-21 MED ORDER — FUROSEMIDE 40 MG PO TABS: 40.0000 mg | ORAL_TABLET | Freq: Every day | ORAL | Status: DC

## 2013-07-21 MED ORDER — ASPIRIN 81 MG PO TBEC: 81.0000 mg | DELAYED_RELEASE_TABLET | Freq: Every day | ORAL | Status: DC

## 2013-07-21 MED ORDER — CARVEDILOL 6.25 MG PO TABS
ORAL_TABLET | ORAL | Status: DC
Start: 2013-07-21 — End: 2013-08-19

## 2013-07-21 NOTE — Patient Instructions (Signed)
Increase your coreg to 6.25 mg (1 tablet) in the morning and 9.375 mg (1 1/2 tablets) in the evening.  Call any issues [phone]  Follow up 3 weeks.  Do the following things EVERYDAY: 1) Weigh yourself in the morning before breakfast. Write it down and keep it in a log. 2) Take your medicines as prescribed 3) Eat low salt foods-Limit salt (sodium) to 2000 mg per day.  4) Stay as active as you can everyday 5) Limit all fluids for the day to less than 2 liters 6)

## 2013-07-21 NOTE — Addendum Note (Signed)
Encounter addended by: Rande Brunt, NP on: 07/21/2013  3:33 PM<BR>     Documentation filed: Notes Section

## 2013-07-21 NOTE — Progress Notes (Signed)
CSW referred to assist patient with orange card. 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 Patient states he is already a patient at [facility] clinic and CSW stated ability to apply for [facility] card at [facility]. Patient verbalizes understanding and will follow up with number provided by CSW for application process. CSW available as needed. Raquel Sarna, [place]

## 2013-07-29 ENCOUNTER — Ambulatory Visit: Payer: No Typology Code available for payment source

## 2013-08-05 ENCOUNTER — Encounter: Payer: Self-pay | Admitting: Family Medicine

## 2013-08-05 ENCOUNTER — Ambulatory Visit (INDEPENDENT_AMBULATORY_CARE_PROVIDER_SITE_OTHER): Payer: No Typology Code available for payment source | Admitting: Family Medicine

## 2013-08-05 VITALS — BP 110/78 | HR 103 | Temp 98.3°F | Wt 173.0 lb

## 2013-08-05 DIAGNOSIS — IMO0002 Reserved for concepts with insufficient information to code with codable children: Secondary | ICD-10-CM

## 2013-08-05 DIAGNOSIS — E1165 Type 2 diabetes mellitus with hyperglycemia: Secondary | ICD-10-CM

## 2013-08-05 DIAGNOSIS — IMO0001 Reserved for inherently not codable concepts without codable children: Secondary | ICD-10-CM

## 2013-08-05 MED ORDER — METFORMIN HCL 500 MG PO TABS: 1000.0000 mg | ORAL_TABLET | Freq: Two times a day (BID) | ORAL | Status: DC

## 2013-08-05 MED ORDER — GLIMEPIRIDE 4 MG PO TABS: 4.0000 mg | ORAL_TABLET | Freq: Every day | ORAL | Status: DC

## 2013-08-05 NOTE — Assessment & Plan Note (Signed)
CBGs reading per meter: 90-180; 30 day avg is 145  He denies hypoglycemia  Has made great dietary changes; and would like to continuing working on diet to hopefully avoid insulin  Plan  Inc Amaryl to 4mg  qd today  Advised starting light exercise program: Walking 37min; three times a week if approved by his cardiologist which he will see on 6/10  Return to clinic in 2 mo for A1c and DM management

## 2013-08-05 NOTE — Patient Instructions (Signed)
It was great seeing you today.   1. I have increased your Amaryl to 4mg  once a day. You can take 2 of your current 2mg  pills until you get your new prescription. 2. Continue taking Metformin 1000mg  twice a day.  3. Check your blood sugar every morning before breakfast or taking your medications, and anytime you think your blood sugar might be low   Please bring all your medications to every doctors visit  Sign up for My Chart to have easy access to your labs results, and communication with your Primary care physician.  Next Appointment  Please call to make an appointment with Dr Berkley Harvey in 2   I look forward to talking with you again at our next visit. If you have any questions or concerns before then, please call the clinic at [phone].  Take Care,   Dr Phill Myron  Low Blood Sugar Low blood sugar (hypoglycemia) means that the level of sugar in your blood is lower than it should be. Signs of low blood sugar include:  Getting sweaty.  Feeling hungry.  Feeling dizzy or weak.  Feeling sleepier than normal.  Feeling nervous.  Headaches.  Having a fast heartbeat. Low blood sugar can happen fast and can be an emergency. Your doctor can do tests to check your blood sugar level. You can have low blood sugar and not have diabetes. HOME CARE  Check your blood sugar as told by your doctor. If it is less than 70 mg/dl or as told by your doctor, take 1 of the following:  3 to 4 glucose tablets.   cup clear juice.   cup soda pop, not diet.  1 cup milk.  5 to 6 hard candies.  Recheck blood sugar after 15 minutes. Repeat until it is at the right level.  Eat a snack if it is more than 1 hour until the next meal.  Only take medicine as told by your doctor.  Do not skip meals. Eat on time.  Do not drink alcohol except with meals.  Check your blood glucose before driving.  Check your blood glucose before and after exercise.  Always carry treatment with you, such  as glucose pills.  Always wear a medical alert bracelet if you have diabetes. GET HELP RIGHT AWAY IF:   Your blood glucose goes below 70 mg/dl or as told by your doctor, and you:  Are confused.  Are not able to swallow.  Pass out (faint).  You cannot treat yourself. You may need someone to help you.  You have low blood sugar problems often.  You have problems from your medicines.  You are not feeling better after 3 to 4 days.  You have vision changes. MAKE SURE YOU:   Understand these instructions.  Will watch this condition.  Will get help right away if you are not doing well or get worse. Document Released: 05/15/2009 Document Revised: 05/13/2011 Document Reviewed: 05/15/2009 [facility] Patient Information 2014 [place], [place].

## 2013-08-05 NOTE — Progress Notes (Signed)
  Patient name: Joseph [name] MRN [id number]  Date of birth: May 05, 1965  CC & HPI:  Joseph [name] is a 48 y.o. male presenting today for DM management  DIABETES   Blood Sugar Ranges: 90 -200; 30 day avg 145  Symptoms of Hypoglycemia? none  Comorbid Symptoms: no Chest pain; no SOB; no Neuropathy: no Vision problems  Medication Compliance: yes   Medication Side Effects: none  Counseling  Diet pattern: Discussed plate method. He has made great improvement - Carb mod diet changed  Exercise: none at this time  Smoking status: stopped    ROS: See HPI above otherwise negative.  Medical & Surgical Hx:  Reviewed.  Medications & Allergies: Reviewed & Updated - see associated section Social History: Reviewed:   Objective Findings:  Vitals: BP 110/78  Pulse 103  Temp(Src) 98.3 F (36.8 C) (Oral)  Wt 78.472 kg (173 lb)  Gen: NAD CV: RRR w/o m/r/g, pulses +2 b/l Resp: CTAB w/ normal respiratory effort  Assessment & Plan:   Please See Problem Focused Assessment & Plan

## 2013-08-11 ENCOUNTER — Encounter (HOSPITAL_COMMUNITY): Payer: Self-pay

## 2013-08-11 ENCOUNTER — Inpatient Hospital Stay (HOSPITAL_COMMUNITY): Admission: RE | Admit: 2013-08-11 | Payer: Self-pay | Source: Ambulatory Visit

## 2013-08-16 ENCOUNTER — Encounter (HOSPITAL_COMMUNITY): Payer: Self-pay

## 2013-08-19 ENCOUNTER — Encounter (HOSPITAL_COMMUNITY): Payer: Self-pay

## 2013-08-19 ENCOUNTER — Ambulatory Visit (HOSPITAL_COMMUNITY)
Admission: RE | Admit: 2013-08-19 | Discharge: 2013-08-19 | Disposition: A | Discharge status: Home / Self Care | Payer: No Typology Code available for payment source | Source: Ambulatory Visit | Attending: Internal Medicine | Admitting: Internal Medicine

## 2013-08-19 VITALS — BP 110/76 | HR 81 | Resp 16 | Wt 178.0 lb

## 2013-08-19 DIAGNOSIS — Z7982 Long term (current) use of aspirin: Secondary | ICD-10-CM | POA: Insufficient documentation

## 2013-08-19 DIAGNOSIS — I5022 Chronic systolic (congestive) heart failure: Secondary | ICD-10-CM | POA: Insufficient documentation

## 2013-08-19 DIAGNOSIS — E119 Type 2 diabetes mellitus without complications: Secondary | ICD-10-CM | POA: Insufficient documentation

## 2013-08-19 DIAGNOSIS — Z87891 Personal history of nicotine dependence: Secondary | ICD-10-CM | POA: Insufficient documentation

## 2013-08-19 DIAGNOSIS — I10 Essential (primary) hypertension: Secondary | ICD-10-CM | POA: Insufficient documentation

## 2013-08-19 DIAGNOSIS — R0989 Other specified symptoms and signs involving the circulatory and respiratory systems: Secondary | ICD-10-CM | POA: Insufficient documentation

## 2013-08-19 DIAGNOSIS — IMO0002 Reserved for concepts with insufficient information to code with codable children: Secondary | ICD-10-CM

## 2013-08-19 DIAGNOSIS — IMO0001 Reserved for inherently not codable concepts without codable children: Secondary | ICD-10-CM

## 2013-08-19 DIAGNOSIS — E1165 Type 2 diabetes mellitus with hyperglycemia: Secondary | ICD-10-CM

## 2013-08-19 DIAGNOSIS — I6350 Cerebral infarction due to unspecified occlusion or stenosis of unspecified cerebral artery: Secondary | ICD-10-CM

## 2013-08-19 DIAGNOSIS — R0609 Other forms of dyspnea: Secondary | ICD-10-CM | POA: Insufficient documentation

## 2013-08-19 DIAGNOSIS — Z8673 Personal history of transient ischemic attack (TIA), and cerebral infarction without residual deficits: Secondary | ICD-10-CM | POA: Insufficient documentation

## 2013-08-19 DIAGNOSIS — I2510 Atherosclerotic heart disease of native coronary artery without angina pectoris: Secondary | ICD-10-CM | POA: Insufficient documentation

## 2013-08-19 DIAGNOSIS — I428 Other cardiomyopathies: Secondary | ICD-10-CM | POA: Insufficient documentation

## 2013-08-19 DIAGNOSIS — F1910 Other psychoactive substance abuse, uncomplicated: Secondary | ICD-10-CM

## 2013-08-19 DIAGNOSIS — I509 Heart failure, unspecified: Secondary | ICD-10-CM | POA: Insufficient documentation

## 2013-08-19 DIAGNOSIS — I639 Cerebral infarction, unspecified: Secondary | ICD-10-CM

## 2013-08-19 MED ORDER — CARVEDILOL 6.25 MG PO TABS: ORAL_TABLET | ORAL | Status: DC

## 2013-08-19 MED ORDER — SPIRONOLACTONE 25 MG PO TABS: 12.5000 mg | ORAL_TABLET | Freq: Every day | ORAL | Status: DC

## 2013-08-19 NOTE — Progress Notes (Signed)
Patient ID: Joseph [name], male   DOB: December 24, 1965, 48 y.o.   MRN: [id number]  PCP: Dr. Berkley Harvey [facility])  HPI: Joseph [name] is a 48 yo male with a history of chronic systolic HF, NICM, HTN, TIA, stroke, DM2,  tobacco abuse and polysubstance abuse.   Has been admitted 2 times in the past month for SOB and right sided weakness and slurred speech. Discharged 07/06/13 and last weight on chart 180 lbs.   R/LHC 06/30/13: RA: 6  RV: 26/9  PA: 26/13  PC: a 18 mean 13 CO/CI Fick: 3.7/2.0 1) Mild CAD with 30% narrowing in mid AV groove circumflex, and 30% proximal and 40% mid RCA stenosis with otherwise normal LAD and ramus  EF: 20%, mild MR, no embolus (06/2013) MRI head: acute infraction with > 3 cm infarct involving L frontal opercular cortex and subcortical white matter (07/2013)  Follow up for Heart Failure: Last visit increased coreg 6.25 mg q pm and 9.375 mg q pm which he tolerated. Overall feeling good. Denies SOB, PND, orthopnea or CP. Weight at home 174 lbs. Wearing lifevest every day with no alarms. Walking everyday for 30 minutes with no issues. No drugs or tobacco abuse. Taking medications as prescribed. Following a low salt diet and drinking less than 2L day.   Labs: (07/21/13) K 4.3, creatinine 1.03    SH: No smoking, no ETOH and no drugs as of 06/28/13. Not working. Planning on applying for disability. Has orange card. Medicaid pending.   FH: Mother: DM2, living        Father deceased, no issues  ROS: All systems negative except as listed in HPI, PMH and Problem List.  PMH:  1) HTN 2) NICM: LHC (06/2013): Mild coronary obstructive disease with 30% narrowing in the mid AV groove circumflex, and 30% proximal and 40% mid RCA stenoses with otherwise normal LAD and ramus intermediate vessel 3) Chronic systolic HF: RHC (06/4008) RA: 6, RV: 26/9, PA: 26/13, PC: a 18 mean 13, CO/CI Fick: 3.7/2.0, left ventriculography EF 15%; ECHO (06/2013) EF 20%, diffuse HK, mild MR; TEE (07/2013)  no thrombus 4) DM2 5) Stroke: MRI (07/2013) revealed an acute infarction, with a greater than 3 cm infarct involving left frontal opercular cortex and subcortical white matter. Carotid dopplers (07/2013) mild soft plaque origin ICA. 1-39% ICA stenosis. Vertebral artery flow is antegrade 6) Polysubstance abuse: + cocaine UDS   Current Outpatient Prescriptions  Medication Sig Dispense Refill  . aspirin 81 MG EC tablet Take 1 tablet (81 mg total) by mouth daily.  30 tablet  3  . atorvastatin (LIPITOR) 80 MG tablet Take 1 tablet (80 mg total) by mouth daily at 6 PM.  30 tablet  3  . Blood Glucose Monitoring Suppl ([place]) DEVI Patient will need to test his blood glucose once daily prior to breakfast and metformin and amaryl administration.  1 Device  0  . carvedilol (COREG) 6.25 MG tablet Take 6.25 mg (1 tablet) in the morning and 9.375 mg (1 1/2 tablets) in the evening.  75 tablet  3  . furosemide (LASIX) 40 MG tablet Take 1 tablet (40 mg total) by mouth daily.  30 tablet  3  . glucose blood test strip Use as instructed  100 each  12  . lisinopril (PRINIVIL,ZESTRIL) 5 MG tablet Take 5 mg (1 tablet) in the evening.  30 tablet  3  . metFORMIN (GLUCOPHAGE) 500 MG tablet Take 2 tablets (1,000 mg total) by mouth 2 (two)  times daily with a meal.  120 tablet  2  . ReliOn Ultra Thin Lancets MISC Patient will need to test his blood glucose once daily prior to breakfast and metformin and amaryl administration.  100 each  2   No current facility-administered medications for this encounter.     Filed Vitals:   08/19/13 1014  BP: 110/76  Pulse: 81  Resp: 16  Weight: 178 lb (80.74 kg)  SpO2: 100%    PHYSICAL EXAM: General:  Well appearing. No resp difficulty HEENT: normal Neck: supple. JVP flat. Carotids 2+ bilaterally; no bruits. No lymphadenopathy or thryomegaly appreciated. Cor: PMI normal. Regular rate & rhythm. No rubs, gallops or murmurs. Lungs: clear Abdomen: soft, nontender,  nondistended. No hepatosplenomegaly. No bruits or masses. Good bowel sounds. Extremities: no cyanosis, clubbing, rash, edema Neuro: alert & orientedx3, cranial nerves grossly intact. Moves all 4 extremities w/o difficulty. Affect pleasant.   ASSESSMENT & PLAN:  1) Chronic systolic HF: NICM, EF 28% (06/2013) - NYHA II symptoms and volume status stable. Will continue lasix 40 mg daily. - Increase coreg to 9.375 mg BID. Instructed to call if any dizziness or fatigue. - Continue lisinopril at current dose. - Start spiro 12.5 mg daily and will check BMET in 7-10 days. - Continue lifevest and will need repeat ECHO in August 2015 to reassess EF. If remains less than 35% will refer to EP for ICD. QRS narrow, no CRT-D. - Discussed in depth the role of the HF clinic and the need to frequent follow up. Informed the patient on the importance of daily weights (provide him with scale and weight chart), following low salt diet, and restricting fluids to less than 2L a day. 2) Non-obstructive CAD - Continue statin, ASA, ACE-I and BB.  3) HTN - Controlled. As above increase BB for LV dysfunction. Add spiro 12.5 mg daily 4) ?OSA - Will eventually need sleep study. Patient reports he snores will set up in future visit once he has Medicaid.  5) Polysubstance abuse - Discussed the need to abstain from ETOH, cocaine and tobacco. Reports he has not used since 06/28/13 and congratulated patient on success. Continue nicotine patches.  6) Stroke (07/2013) - acute infraction with > 3 cm infarct involving L frontal opercular cortex and subcortical white matter. Continue statin and ASA 7) DM2 - Followed by PCP. Ok to start Amarly 4 mg daily  F/U 3 weeks Joseph [name] BNP-C 10:18 AM

## 2013-08-19 NOTE — Patient Instructions (Signed)
Increase your coreg to 9.375 mg (1 1/2 tablets) in the morning and 9.375 mg (1 1/2 tablets) in the evening.  Start spironolactone 12.5 mg daily.  Call your Primary Care Doctor about starting Amaryl 4 mg daily, ok to start from cardiology standpoint.  Follow up 3 weeks.  Do the following things EVERYDAY: 1) Weigh yourself in the morning before breakfast. Write it down and keep it in a log. 2) Take your medicines as prescribed 3) Eat low salt foods-Limit salt (sodium) to 2000 mg per day.  4) Stay as active as you can everyday 5) Limit all fluids for the day to less than 2 liters 6)

## 2013-08-27 ENCOUNTER — Ambulatory Visit (HOSPITAL_COMMUNITY)
Admission: RE | Admit: 2013-08-27 | Discharge: 2013-08-27 | Disposition: A | Discharge status: Home / Self Care | Payer: No Typology Code available for payment source | Source: Ambulatory Visit | Attending: Internal Medicine | Admitting: Internal Medicine

## 2013-08-27 DIAGNOSIS — I509 Heart failure, unspecified: Secondary | ICD-10-CM | POA: Insufficient documentation

## 2013-08-27 DIAGNOSIS — I5022 Chronic systolic (congestive) heart failure: Secondary | ICD-10-CM

## 2013-08-27 LAB — BASIC METABOLIC PANEL
BUN: 16 mg/dL (ref 6–23)
CHLORIDE: 106 meq/L (ref 96–112)
CO2: 22 meq/L (ref 19–32)
Calcium: 9.4 mg/dL (ref 8.4–10.5)
Creatinine, Ser: 0.91 mg/dL (ref 0.50–1.35)
GFR calc Af Amer: >90 mL/min (ref >90)
GFR calc non Af Amer: >90 mL/min (ref >90)
Glucose, Bld: 193 mg/dL — ABNORMAL HIGH (ref 70–99)
Potassium: 4.5 mEq/L (ref 3.7–5.3)
Sodium: 142 mEq/L (ref 137–147)

## 2013-09-09 ENCOUNTER — Encounter: Payer: Self-pay | Admitting: Licensed Clinical Social Worker

## 2013-09-09 ENCOUNTER — Ambulatory Visit (HOSPITAL_COMMUNITY)
Admission: RE | Admit: 2013-09-09 | Discharge: 2013-09-09 | Disposition: A | Discharge status: Home / Self Care | Payer: Medicaid Other | Source: Ambulatory Visit | Attending: Adult Health | Admitting: Adult Health

## 2013-09-09 ENCOUNTER — Encounter (HOSPITAL_COMMUNITY): Payer: Self-pay

## 2013-09-09 VITALS — BP 122/80 | HR 97 | Resp 16 | Wt 181.1 lb

## 2013-09-09 DIAGNOSIS — Z09 Encounter for follow-up examination after completed treatment for conditions other than malignant neoplasm: Secondary | ICD-10-CM | POA: Diagnosis not present

## 2013-09-09 DIAGNOSIS — I5022 Chronic systolic (congestive) heart failure: Secondary | ICD-10-CM | POA: Diagnosis not present

## 2013-09-09 DIAGNOSIS — Z8673 Personal history of transient ischemic attack (TIA), and cerebral infarction without residual deficits: Secondary | ICD-10-CM | POA: Diagnosis not present

## 2013-09-09 DIAGNOSIS — F1910 Other psychoactive substance abuse, uncomplicated: Secondary | ICD-10-CM

## 2013-09-09 DIAGNOSIS — Z79899 Other long term (current) drug therapy: Secondary | ICD-10-CM | POA: Insufficient documentation

## 2013-09-09 DIAGNOSIS — I2510 Atherosclerotic heart disease of native coronary artery without angina pectoris: Secondary | ICD-10-CM | POA: Insufficient documentation

## 2013-09-09 DIAGNOSIS — IMO0001 Reserved for inherently not codable concepts without codable children: Secondary | ICD-10-CM

## 2013-09-09 DIAGNOSIS — Z7982 Long term (current) use of aspirin: Secondary | ICD-10-CM | POA: Insufficient documentation

## 2013-09-09 DIAGNOSIS — E119 Type 2 diabetes mellitus without complications: Secondary | ICD-10-CM | POA: Diagnosis not present

## 2013-09-09 DIAGNOSIS — F17200 Nicotine dependence, unspecified, uncomplicated: Secondary | ICD-10-CM | POA: Insufficient documentation

## 2013-09-09 DIAGNOSIS — E1165 Type 2 diabetes mellitus with hyperglycemia: Secondary | ICD-10-CM

## 2013-09-09 DIAGNOSIS — I639 Cerebral infarction, unspecified: Secondary | ICD-10-CM

## 2013-09-09 DIAGNOSIS — I10 Essential (primary) hypertension: Secondary | ICD-10-CM | POA: Diagnosis not present

## 2013-09-09 DIAGNOSIS — IMO0002 Reserved for concepts with insufficient information to code with codable children: Secondary | ICD-10-CM

## 2013-09-09 DIAGNOSIS — I6350 Cerebral infarction due to unspecified occlusion or stenosis of unspecified cerebral artery: Secondary | ICD-10-CM

## 2013-09-09 MED ORDER — CARVEDILOL 12.5 MG PO TABS: 12.5000 mg | ORAL_TABLET | Freq: Two times a day (BID) | ORAL | Status: DC

## 2013-09-09 NOTE — Progress Notes (Signed)
CSW met with patient in the clinic to discuss disability application. Patient reports he has an appointment for [place] and unsure what documents he should take. CSW reviewed needed items and process for Disability application. Patient verbalizes understanding of process and needed items. CSW continues to be available as needed. Raquel Sarna, [place]

## 2013-09-09 NOTE — Progress Notes (Signed)
Patient ID: Joseph [name], male   DOB: 25-Sep-1965, 48 y.o.   MRN: [id number]  PCP: Dr. Berkley Harvey [facility])  HPI: [name] is a 48 yo male with a history of chronic systolic HF, NICM, HTN, TIA, stroke, DM2,  tobacco abuse and polysubstance abuse.   Has been admitted 2 times in the past month for SOB and right sided weakness and slurred speech. Discharged 07/06/13 and last weight on chart 180 lbs.   R/LHC 06/30/13: RA: 6  RV: 26/9  PA: 26/13  PC: a 18 mean 13 CO/CI Fick: 3.7/2.0 1) Mild CAD with 30% narrowing in mid AV groove circumflex, and 30% proximal and 40% mid RCA stenosis with otherwise normal LAD and ramus  EF: 20%, mild MR, no embolus (06/2013) MRI head: acute infraction with > 3 cm infarct involving L frontal opercular cortex and subcortical white matter (07/2013)  Follow up for Heart Failure: Last visit increased coreg 9.375 mg twice a day and spironolactone 12.5 mg added.Denies SOB/PND/Orthopnea/CP. Able to walk 30-45 minutes per day. Wears Life Vest. No alarms.  Weight at home 177 pounds. No extra lasix.  Taking all medications. Has not had alcohol or cigarettes.  Following a low salt diet and drinking less than 2L day. Has difficulty getting transportation to appointments.   Labs: (07/21/13) K 4.3, creatinine 1.03 (08/27/13 ) K 4.5 Creatinine 0.91     SH: No smoking, no ETOH and no drugs as of 06/28/13. Not working. Planning on applying for disability. Has orange card. Medicaid pending.   FH: Mother: DM2, living        Father deceased, no issues  ROS: All systems negative except as listed in HPI, PMH and Problem List.  PMH:  1) HTN 2) NICM: LHC (06/2013): Mild coronary obstructive disease with 30% narrowing in the mid AV groove circumflex, and 30% proximal and 40% mid RCA stenoses with otherwise normal LAD and ramus intermediate vessel 3) Chronic systolic HF: RHC (09/5641) RA: 6, RV: 26/9, PA: 26/13, PC: a 18 mean 13, CO/CI Fick: 3.7/2.0, left ventriculography EF  15%; ECHO (06/2013) EF 20%, diffuse HK, mild MR; TEE (07/2013) no thrombus 4) DM2 5) Stroke: MRI (07/2013) revealed an acute infarction, with a greater than 3 cm infarct involving left frontal opercular cortex and subcortical white matter. Carotid dopplers (07/2013) mild soft plaque origin ICA. 1-39% ICA stenosis. Vertebral artery flow is antegrade 6) Polysubstance abuse: + cocaine UDS (last used April)    Current Outpatient Prescriptions  Medication Sig Dispense Refill  . aspirin 81 MG EC tablet Take 1 tablet (81 mg total) by mouth daily.  30 tablet  3  . atorvastatin (LIPITOR) 80 MG tablet Take 1 tablet (80 mg total) by mouth daily at 6 PM.  30 tablet  3  . Blood Glucose Monitoring Suppl ([place]) DEVI Patient will need to test his blood glucose once daily prior to breakfast and metformin and amaryl administration.  1 Device  0  . carvedilol (COREG) 6.25 MG tablet Take 9.375 mg (1 1/2 tablets) in the morning and 9.375 mg (1 1/2 tablets) in the evening.  90 tablet  3  . furosemide (LASIX) 40 MG tablet Take 1 tablet (40 mg total) by mouth daily.  30 tablet  3  . glucose blood test strip Use as instructed  100 each  12  . lisinopril (PRINIVIL,ZESTRIL) 5 MG tablet Take 5 mg (1 tablet) in the evening.  30 tablet  3  . metFORMIN (GLUCOPHAGE) 500 MG  tablet Take 2 tablets (1,000 mg total) by mouth 2 (two) times daily with a meal.  120 tablet  2  . ReliOn Ultra Thin Lancets MISC Patient will need to test his blood glucose once daily prior to breakfast and metformin and amaryl administration.  100 each  2  . spironolactone (ALDACTONE) 25 MG tablet Take 0.5 tablets (12.5 mg total) by mouth daily.  15 tablet  3   No current facility-administered medications for this encounter.     Filed Vitals:   09/09/13 0934  BP: 122/80  Pulse: 97  Resp: 16  Weight: 181 lb 2 oz (82.158 kg)  SpO2: 100%    PHYSICAL EXAM: General:  Well appearing. No resp difficulty HEENT: normal Neck: supple. JVP  flat. Carotids 2+ bilaterally; no bruits. No lymphadenopathy or thryomegaly appreciated. Cor: PMI normal. Regular rate & rhythm. No rubs, gallops or murmurs. Life Vest On  Lungs: clear Abdomen: soft, nontender, nondistended. No hepatosplenomegaly. No bruits or masses. Good bowel sounds. Extremities: no cyanosis, clubbing, rash, edema Neuro: alert & orientedx3, cranial nerves grossly intact. Moves all 4 extremities w/o difficulty. Affect pleasant.   ASSESSMENT & PLAN:  1) Chronic systolic HF: NICM, EF 97% (06/2013) - NYHA II symptoms and volume status stable. Will continue lasix 40 mg daily. - Increase coreg to 12.5  mg BID.  - Continue lisinopril 5 mg daily.  - Continue  spiro 12.5 mg daily - BMET ok from last week.  - Continue lifevest and will need repeat ECHO next visit. If EF remains less than 35% will refer to EP for ICD. QRS narrow, no CRT-D. - Discussed in depth the role of the HF clinic and the need to frequent follow up. Informed the patient on the importance of daily weights (provide him with scale and weight chart), following low salt diet, and restricting fluids to less than 2L a day. Continue Paramedicine  2) Non-obstructive CAD - Continue statin, ASA, ACE-I and BB.  3) HTN - Controlled. As above increase BB for LV dysfunction. Add spiro 12.5 mg daily 4) ?OSA - Will eventually need sleep study. Hold off until insurance in place. Patient reports he snores will set up in future visit once he has Medicaid.  5) Polysubstance abuse - He remains drug free.  Reports he has not used since 06/28/13 and congratulated patient on success. Continue nicotine patches.  6) Stroke (07/2013) - acute infraction with > 3 cm infarct involving L frontal opercular cortex and subcortical white matter. Continue statin and ASA 7) DM2 - Followed up with PCP.  Follow up in 4 weeks with an ECHO   CLEGG,AMYNP-C 9:37 AM

## 2013-10-12 NOTE — Progress Notes (Addendum)
Patient ID: Joseph [name], male   DOB: [birth year], 48 y.o.   MRN: [id number]  PCP: Dr. Berkley Harvey [facility])  HPI: Joseph [name] is a 48 yo male with a history of chronic systolic HF, NICM, HTN, TIA, stroke, DM2,  tobacco abuse and polysubstance abuse.   Has been admitted 2 times in the past month for SOB and right sided weakness and slurred speech. Discharged 07/06/13 and last weight on chart 180 lbs.   R/LHC 06/30/13: RA: 6  RV: 26/9  PA: 26/13  PC: a 18 mean 13 CO/CI Fick: 3.7/2.0 1) Mild CAD with 30% narrowing in mid AV groove circumflex, and 30% proximal and 40% mid RCA stenosis with otherwise normal LAD and ramus  EF: 20%, mild MR, no embolus (06/2013) MRI head: acute infraction with > 3 cm infarct involving L frontal opercular cortex and subcortical white matter (07/2013) ECHO (10/13/13) EF 20-25% (10/2013)  Follow up for Heart Failure: Last visit increased coreg to 12.5mg  twice a day which he tolerated. Doing great. Denies SOB, orthopnea, CP or PND. Walking about 30-45 minutes a day with no issues. Can go up hills and stairs. Wearing LifeVest 24/7 except showers and he has had not had any alarms. Taking medications as prescribed. Weight at home 177-179 lbs. Still has not had any cigarettes or alcohol. Trouble getting to visits sometimes d/t transportation.   Labs: (07/21/13) K 4.3, creatinine 1.03 (08/27/13 ) K 4.5 Creatinine 0.91   SH: No smoking, no ETOH and no drugs as of 06/28/13. Not working. Applied for disability. Has orange card. Medicaid pending.   FH: Mother: DM2, living        Father deceased, no issues  ROS: All systems negative except as listed in HPI, PMH and Problem List.  PMH:  1) HTN 2) NICM: LHC (06/2013): Mild coronary obstructive disease with 30% narrowing in the mid AV groove circumflex, and 30% proximal and 40% mid RCA stenoses with otherwise normal LAD and ramus intermediate vessel 3) Chronic systolic HF: RHC (0/2725) RA: 6, RV: 26/9, PA: 26/13, PC: a 18  mean 13, CO/CI Fick: 3.7/2.0, left ventriculography EF 15%; ECHO (06/2013) EF 20%, diffuse HK, mild MR; TEE (07/2013) no thrombus; ECHO (10/2013): EF 20-25%, diff HK, LA mod dilated 4) DM2 5) Stroke: MRI (07/2013) revealed an acute infarction, with a greater than 3 cm infarct involving left frontal opercular cortex and subcortical white matter. Carotid dopplers (07/2013) mild soft plaque origin ICA. 1-39% ICA stenosis. Vertebral artery flow is antegrade 6) Polysubstance abuse: + cocaine UDS (last used April)    Current Outpatient Prescriptions  Medication Sig Dispense Refill  . aspirin 81 MG EC tablet Take 1 tablet (81 mg total) by mouth daily.  30 tablet  3  . atorvastatin (LIPITOR) 80 MG tablet Take 1 tablet (80 mg total) by mouth daily at 6 PM.  30 tablet  3  . Blood Glucose Monitoring Suppl ([place]) DEVI Patient will need to test his blood glucose once daily prior to breakfast and metformin and amaryl administration.  1 Device  0  . carvedilol (COREG) 12.5 MG tablet Take 1 tablet (12.5 mg total) by mouth 2 (two) times daily with a meal.  60 tablet  6  . furosemide (LASIX) 40 MG tablet Take 1 tablet (40 mg total) by mouth daily.  30 tablet  3  . glucose blood test strip Use as instructed  100 each  12  . lisinopril (PRINIVIL,ZESTRIL) 5 MG tablet Take 5 mg (1  tablet) in the evening.  30 tablet  3  . metFORMIN (GLUCOPHAGE) 500 MG tablet Take 2 tablets (1,000 mg total) by mouth 2 (two) times daily with a meal.  120 tablet  2  . ReliOn Ultra Thin Lancets MISC Patient will need to test his blood glucose once daily prior to breakfast and metformin and amaryl administration.  100 each  2  . spironolactone (ALDACTONE) 25 MG tablet Take 0.5 tablets (12.5 mg total) by mouth daily.  15 tablet  3   No current facility-administered medications for this encounter.     Filed Vitals:   10/13/13 0930  BP: 114/70  Pulse: 81  Weight: 182 lb 12.8 oz (82.918 kg)  SpO2: 100%    PHYSICAL  EXAM: General:  Well appearing. No resp difficulty HEENT: normal Neck: supple. JVP flat. Carotids 2+ bilaterally; no bruits. No lymphadenopathy or thryomegaly appreciated. Cor: PMI normal. Regular rate & rhythm. No rubs, gallops or murmurs. Life Vest On  Lungs: clear Abdomen: soft, nontender, nondistended. No hepatosplenomegaly. No bruits or masses. Good bowel sounds. Extremities: no cyanosis, clubbing, rash, edema Neuro: alert & orientedx3, cranial nerves grossly intact. Moves all 4 extremities w/o difficulty. Affect pleasant.   ASSESSMENT & PLAN:  1) Chronic systolic HF: NICM, EF 76-73% (10/2013) - NYHA II symptoms and volume status stable. Will continue lasix 40 mg daily. Discussed the use of sliding scale of diuretics.  - Continue coreg 12.5  mg BID.  - Stop lisinopril, last dose was last night. Start Entresto 24/26 BID starting tomorrow. Will send in for patient assistance program. Check BMET in 2 weeks.  - Continue  spiro 12.5 mg daily - ECHO reviewed today with patient and EF remains less than 35% will refer to EP for ICD preferably Med[employer]onic for HF diagnostics. Continue to wear LifeVest currently.  - Reinforced the need and importance of daily weights, a low sodium diet, and fluid restriction (less than 2 L a day). Instructed to call the HF clinic if weight increases more than 3 lbs overnight or 5 lbs in a week.  2) Non-obstructive CAD - No s/s of ischemia. Continue statin, ASA, ACE-I and BB.  3) HTN - Controlled. As above start Entresto 24/26 BID.  4) ?OSA - Will eventually need sleep study. Hold off until insurance in place. Patient reports he snores will set up in future visit once he has Medicaid, currently has orange card.  5) Polysubstance abuse - He remains drug free.  Reports he has not used since 06/28/13 and congratulated patient on success. Continue nicotine patches.  6) Stroke (07/2013) - acute infraction with > 3 cm infarct involving L frontal opercular cortex and  subcortical white matter. Continue statin and ASA 7) DM2 - Followed up with PCP.   F/U 2 weeks.  [name] BNP-C 9:31 AM

## 2013-10-13 ENCOUNTER — Encounter (HOSPITAL_COMMUNITY): Payer: No Typology Code available for payment source

## 2013-10-13 ENCOUNTER — Ambulatory Visit (INDEPENDENT_AMBULATORY_CARE_PROVIDER_SITE_OTHER): Payer: No Typology Code available for payment source | Admitting: Family Medicine

## 2013-10-13 ENCOUNTER — Encounter: Payer: Self-pay | Admitting: Family Medicine

## 2013-10-13 ENCOUNTER — Ambulatory Visit (HOSPITAL_BASED_OUTPATIENT_CLINIC_OR_DEPARTMENT_OTHER)
Admission: RE | Admit: 2013-10-13 | Discharge: 2013-10-13 | Disposition: A | Discharge status: Home / Self Care | Payer: No Typology Code available for payment source | Source: Ambulatory Visit | Attending: Internal Medicine | Admitting: Internal Medicine

## 2013-10-13 ENCOUNTER — Ambulatory Visit (HOSPITAL_COMMUNITY)
Admission: RE | Admit: 2013-10-13 | Discharge: 2013-10-13 | Disposition: A | Discharge status: Home / Self Care | Payer: No Typology Code available for payment source | Source: Ambulatory Visit | Attending: Internal Medicine | Admitting: Internal Medicine

## 2013-10-13 ENCOUNTER — Encounter (HOSPITAL_COMMUNITY): Payer: Self-pay

## 2013-10-13 VITALS — BP 132/91 | HR 78 | Temp 98.0°F | Wt 182.0 lb

## 2013-10-13 VITALS — BP 114/70 | HR 81 | Wt 182.8 lb

## 2013-10-13 DIAGNOSIS — I509 Heart failure, unspecified: Secondary | ICD-10-CM | POA: Insufficient documentation

## 2013-10-13 DIAGNOSIS — I10 Essential (primary) hypertension: Secondary | ICD-10-CM

## 2013-10-13 DIAGNOSIS — I059 Rheumatic mitral valve disease, unspecified: Secondary | ICD-10-CM

## 2013-10-13 DIAGNOSIS — R0989 Other specified symptoms and signs involving the circulatory and respiratory systems: Secondary | ICD-10-CM | POA: Insufficient documentation

## 2013-10-13 DIAGNOSIS — I5020 Unspecified systolic (congestive) heart failure: Secondary | ICD-10-CM

## 2013-10-13 DIAGNOSIS — Z8673 Personal history of transient ischemic attack (TIA), and cerebral infarction without residual deficits: Secondary | ICD-10-CM | POA: Insufficient documentation

## 2013-10-13 DIAGNOSIS — F1910 Other psychoactive substance abuse, uncomplicated: Secondary | ICD-10-CM

## 2013-10-13 DIAGNOSIS — IMO0002 Reserved for concepts with insufficient information to code with codable children: Secondary | ICD-10-CM

## 2013-10-13 DIAGNOSIS — I5022 Chronic systolic (congestive) heart failure: Secondary | ICD-10-CM

## 2013-10-13 DIAGNOSIS — F17200 Nicotine dependence, unspecified, uncomplicated: Secondary | ICD-10-CM | POA: Insufficient documentation

## 2013-10-13 DIAGNOSIS — E1165 Type 2 diabetes mellitus with hyperglycemia: Secondary | ICD-10-CM

## 2013-10-13 DIAGNOSIS — R0609 Other forms of dyspnea: Secondary | ICD-10-CM | POA: Insufficient documentation

## 2013-10-13 DIAGNOSIS — Z23 Encounter for immunization: Secondary | ICD-10-CM

## 2013-10-13 DIAGNOSIS — E119 Type 2 diabetes mellitus without complications: Secondary | ICD-10-CM | POA: Insufficient documentation

## 2013-10-13 DIAGNOSIS — IMO0001 Reserved for inherently not codable concepts without codable children: Secondary | ICD-10-CM

## 2013-10-13 LAB — POCT GLYCOSYLATED HEMOGLOBIN (HGB A1C): Hemoglobin A1C: 7.4

## 2013-10-13 MED ORDER — TETANUS-DIPHTH-ACELL PERTUSSIS 5-2.5-18.5 LF-MCG/0.5 IM SUSP
0.5000 mL | Freq: Once | INTRAMUSCULAR | Status: AC
  Administered 2013-10-13: 0.5 mL via INTRAMUSCULAR

## 2013-10-13 NOTE — Assessment & Plan Note (Signed)
He is doing well; Seen by Cardiology today - he continues to wear live-vest - Has f/u apt w/ Cards on 8/26 to discuss AICD

## 2013-10-13 NOTE — Assessment & Plan Note (Signed)
Well controlled - His cardiologist stopped his Lisinopril today and started Entresto today but he was unable to get this at his pharmacy. He has called his cardiologist and waiting to hear from them

## 2013-10-13 NOTE — Patient Instructions (Signed)
It was great seeing you today.   1. Your diabetes is getting better, [place]!   Please bring all your medications to every doctors visit  Sign up for My Chart to have easy access to your labs results, and communication with your Primary care physician.  Next Appointment  Please call to make an appointment with Dr Berkley Harvey in 3 months   Make an appointment for your Diabetes Eye exam  I look forward to talking with you again at our next visit. If you have any questions or concerns before then, please call the clinic at [phone].  Take Care,   Dr Phill Myron

## 2013-10-13 NOTE — Progress Notes (Signed)
Echo Lab  2D Echocardiogram completed.  [place], [place] 10/13/2013 9:28 AM

## 2013-10-13 NOTE — Progress Notes (Signed)
   Subjective:    Patient ID: Joseph [name], male    DOB: 16-Mar-1965, 48 y.o.   MRN: [id number]  HPI Comments: Mr Canner comes in today for followup of his diabetes.  He was just evaluated by his cardiologist today for his hypertension and heart failure. He says they stopped his Lisinopril and started Entresto but he was unable to get this at his pharmacy. He has called his cardiologist and is waiting to hear from them. He has a follow-up apt with them on 8/26 to discuss live-vest and possible AICD placement.   DIABETES  Blood Sugar Ranges: 89-146 Symptoms of Hypoglycemia? no Comorbid Symptoms: Denies Chest pain; SOB; Neuropathy; Vision problems Medication Compliance: yes  Medication Side Effects: Denies  Counseling  Diet pattern: Discuss Carb and Calories; He is concerned about gaining weight. Says he has a book he is going to read, and will wait to talk to a nutritionist until he has read the book  Exercise: Walking  Smoking status: Quit    Preventative Health Care  Eye Exam: Will Scheduled today  Foot Exam: Done today - no concerns  Vaccinations reveiwed: Needs Tdap   Review of Systems See HPI     Objective:   Physical Exam  Vitals reviewed. Constitutional: He appears well-developed and well-nourished.  Cardiovascular: Normal rate and regular rhythm.   No murmur heard. No LE edema  Pulmonary/Chest: Effort normal and breath sounds normal. No respiratory distress.   Assessment/Plan:      See Problem Focused Assessment & Plan

## 2013-10-13 NOTE — Assessment & Plan Note (Addendum)
Good Control - Must improved - Continue Metformin; Denies Side-effects - He is concerned about weight gain, and is reading a book about nutrition; Walks daily  - He will schedule is eye exam with [place] in the next 1-2 weeks - Tdap today - Foot exam done

## 2013-10-13 NOTE — Patient Instructions (Signed)
Doing great.  Will call you about your ECHO later today.  Stop lisinopril and I took the bottle from you.  Start Entresto 24/26 (1 tablet) twice a day.  Follow up in 2 weeks.  Call any issues.  Do the following things EVERYDAY: 1) Weigh yourself in the morning before breakfast. Write it down and keep it in a log. 2) Take your medicines as prescribed 3) Eat low salt foods-Limit salt (sodium) to 2000 mg per day.  4) Stay as active as you can everyday 5) Limit all fluids for the day to less than 2 liters 6)

## 2013-10-14 NOTE — Addendum Note (Signed)
Encounter addended by: Karis Rilling B Caia Lofaro, NP on: 10/14/2013  7:35 AM<BR>     Documentation filed: Notes Section

## 2013-10-25 ENCOUNTER — Telehealth (HOSPITAL_COMMUNITY): Payer: Self-pay | Admitting: Vascular Surgery

## 2013-10-25 NOTE — Telephone Encounter (Signed)
Pt Investment banker, operational .Marland Kitchen They need application , proof of income and a valid prescription.. Please advise

## 2013-10-27 ENCOUNTER — Encounter (HOSPITAL_COMMUNITY): Payer: Self-pay

## 2013-10-27 ENCOUNTER — Ambulatory Visit (HOSPITAL_COMMUNITY)
Admission: RE | Admit: 2013-10-27 | Discharge: 2013-10-27 | Disposition: A | Discharge status: Home / Self Care | Payer: Medicaid Other | Source: Ambulatory Visit | Attending: Internal Medicine | Admitting: Internal Medicine

## 2013-10-27 VITALS — BP 122/64 | HR 83 | Resp 18 | Wt 184.5 lb

## 2013-10-27 DIAGNOSIS — Z87891 Personal history of nicotine dependence: Secondary | ICD-10-CM | POA: Diagnosis not present

## 2013-10-27 DIAGNOSIS — F1910 Other psychoactive substance abuse, uncomplicated: Secondary | ICD-10-CM

## 2013-10-27 DIAGNOSIS — Z8673 Personal history of transient ischemic attack (TIA), and cerebral infarction without residual deficits: Secondary | ICD-10-CM | POA: Diagnosis not present

## 2013-10-27 DIAGNOSIS — I2510 Atherosclerotic heart disease of native coronary artery without angina pectoris: Secondary | ICD-10-CM | POA: Diagnosis not present

## 2013-10-27 DIAGNOSIS — Z7982 Long term (current) use of aspirin: Secondary | ICD-10-CM | POA: Insufficient documentation

## 2013-10-27 DIAGNOSIS — I5022 Chronic systolic (congestive) heart failure: Secondary | ICD-10-CM | POA: Insufficient documentation

## 2013-10-27 DIAGNOSIS — I10 Essential (primary) hypertension: Secondary | ICD-10-CM | POA: Diagnosis not present

## 2013-10-27 DIAGNOSIS — E1165 Type 2 diabetes mellitus with hyperglycemia: Secondary | ICD-10-CM

## 2013-10-27 DIAGNOSIS — I509 Heart failure, unspecified: Secondary | ICD-10-CM | POA: Insufficient documentation

## 2013-10-27 DIAGNOSIS — E119 Type 2 diabetes mellitus without complications: Secondary | ICD-10-CM | POA: Insufficient documentation

## 2013-10-27 DIAGNOSIS — IMO0001 Reserved for inherently not codable concepts without codable children: Secondary | ICD-10-CM

## 2013-10-27 DIAGNOSIS — IMO0002 Reserved for concepts with insufficient information to code with codable children: Secondary | ICD-10-CM

## 2013-10-27 LAB — BASIC METABOLIC PANEL
ANION GAP: 14 (ref 5–15)
BUN: 14 mg/dL (ref 6–23)
CALCIUM: 9.2 mg/dL (ref 8.4–10.5)
CO2: 20 meq/L (ref 19–32)
Chloride: 105 mEq/L (ref 96–112)
Creatinine, Ser: 0.8 mg/dL (ref 0.50–1.35)
GFR calc non Af Amer: >90 mL/min (ref >90)
Glucose, Bld: 117 mg/dL — ABNORMAL HIGH (ref 70–99)
Potassium: 4.5 mEq/L (ref 3.7–5.3)
SODIUM: 139 meq/L (ref 137–147)

## 2013-10-27 MED ORDER — CARVEDILOL 12.5 MG PO TABS: 18.7500 mg | ORAL_TABLET | Freq: Two times a day (BID) | ORAL | Status: DC

## 2013-10-27 NOTE — Progress Notes (Signed)
Patient ID: Joseph [name], male   DOB: March 06, 1965, 48 y.o.   MRN: [id number]  PCP: Dr. Berkley Harvey [facility])  HPI: Joseph [name] is a 48 yo male with a history of chronic systolic HF, NICM, HTN, TIA, stroke, DM2,  tobacco abuse and polysubstance abuse.   Has been admitted 2 times in the past month for SOB and right sided weakness and slurred speech. Discharged 07/06/13 and last weight on chart 180 lbs.   R/LHC 06/30/13: RA: 6  RV: 26/9  PA: 26/13  PC: a 18 mean 13 CO/CI Fick: 3.7/2.0 1) Mild CAD with 30% narrowing in mid AV groove circumflex, and 30% proximal and 40% mid RCA stenosis with otherwise normal LAD and ramus  EF: 20%, mild MR, no embolus (06/2013) MRI head: acute infraction with > 3 cm infarct involving L frontal opercular cortex and subcortical white matter (07/2013) ECHO (10/13/13) EF 20-25% (10/2013)  Follow up for Heart Failure: Last visit stopped lisinopril last visit and started Entresto 24-26 BID. He has not had any issues with new medication. Denies dizziness, SOB, orthopnea, CP or edema. Walking 45 minutes a day with no issues. No alarms on LifeVest and wearing 24/7. Taking medications as prescribed. Weight at home 184 lbs. Continues to abstain from smoking and alcohol.   Labs: (07/21/13) K 4.3, creatinine 1.03 (08/27/13 ) K 4.5 Creatinine 0.91   SH: No smoking, no ETOH and no drugs as of 06/28/13. Not working. Applied for disability. Has orange card. Medicaid pending.   FH: Mother: DM2, living        Father deceased, no issues  ROS: All systems negative except as listed in HPI, PMH and Problem List.  PMH:  1) HTN 2) NICM: LHC (06/2013): Mild coronary obstructive disease with 30% narrowing in the mid AV groove circumflex, and 30% proximal and 40% mid RCA stenoses with otherwise normal LAD and ramus intermediate vessel 3) Chronic systolic HF: RHC (10/1446) RA: 6, RV: 26/9, PA: 26/13, PC: a 18 mean 13, CO/CI Fick: 3.7/2.0, left ventriculography EF 15%; ECHO (06/2013) EF  20%, diffuse HK, mild MR; TEE (07/2013) no thrombus; ECHO (10/2013): EF 20-25%, diff HK, LA mod dilated 4) DM2 5) Stroke: MRI (07/2013) revealed an acute infarction, with a greater than 3 cm infarct involving left frontal opercular cortex and subcortical white matter. Carotid dopplers (07/2013) mild soft plaque origin ICA. 1-39% ICA stenosis. Vertebral artery flow is antegrade 6) Polysubstance abuse: + cocaine UDS (last used April)    Current Outpatient Prescriptions  Medication Sig Dispense Refill  . aspirin 81 MG EC tablet Take 1 tablet (81 mg total) by mouth daily.  30 tablet  3  . atorvastatin (LIPITOR) 80 MG tablet Take 1 tablet (80 mg total) by mouth daily at 6 PM.  30 tablet  3  . Blood Glucose Monitoring Suppl ([place]) DEVI Patient will need to test his blood glucose once daily prior to breakfast and metformin and amaryl administration.  1 Device  0  . carvedilol (COREG) 12.5 MG tablet Take 1 tablet (12.5 mg total) by mouth 2 (two) times daily with a meal.  60 tablet  6  . furosemide (LASIX) 40 MG tablet Take 1 tablet (40 mg total) by mouth daily.  30 tablet  3  . glucose blood test strip Use as instructed  100 each  12  . metFORMIN (GLUCOPHAGE) 500 MG tablet Take 2 tablets (1,000 mg total) by mouth 2 (two) times daily with a meal.  120 tablet  2  . NON FORMULARY Take 24-26 mg by mouth 2 (two) times daily. Entresto      . ReliOn Ultra Thin Lancets MISC Patient will need to test his blood glucose once daily prior to breakfast and metformin and amaryl administration.  100 each  2  . Sacubitril-Valsartan (ENTRESTO) 24-26 MG TABS Take 1 tablet by mouth 2 (two) times daily.      Marland Kitchen spironolactone (ALDACTONE) 25 MG tablet Take 0.5 tablets (12.5 mg total) by mouth daily.  15 tablet  3   No current facility-administered medications for this encounter.     Filed Vitals:   10/27/13 0936  BP: 122/64  Pulse: 83  Resp: 18  Weight: 184 lb 8 oz (83.689 kg)  SpO2: 99%    PHYSICAL  EXAM: General:  Well appearing. No resp difficulty HEENT: normal Neck: supple. JVP flat. Carotids 2+ bilaterally; no bruits. No lymphadenopathy or thryomegaly appreciated. Cor: PMI normal. Regular rate & rhythm. No rubs, gallops or murmurs. Life Vest On  Lungs: clear Abdomen: soft, nontender, nondistended. No hepatosplenomegaly. No bruits or masses. Good bowel sounds. Extremities: no cyanosis, clubbing, rash, edema Neuro: alert & orientedx3, cranial nerves grossly intact. Moves all 4 extremities w/o difficulty. Affect pleasant.   ASSESSMENT & PLAN:  1) Chronic systolic HF: NICM, EF 09-23% (10/2013) - NYHA II symptoms and volume status stable. Will continue lasix 40 mg daily. Discussed the use of sliding scale of diuretics.  - increase coreg to 18.75 mg BID, call any dizziness or fatigue.  - Continue Entresto 24/26 BID. He currently has medication and is filling paper work out for assistance program.  - Continue spiro 12.5 mg daily.  - Last EF less than 35% pending EP referral for ICD. Continue to wear LifeVest currently.  - Reinforced the need and importance of daily weights, a low sodium diet, and fluid restriction (less than 2 L a day). Instructed to call the HF clinic if weight increases more than 3 lbs overnight or 5 lbs in a week.  2) Non-obstructive CAD - No s/s of ischemia. Continue statin, ASA, ACE-I and BB.  3) HTN - Controlled. As above increase BB for LV dysfunction.  4) ?OSA - Will eventually need sleep study. Hold off until insurance in place. Patient reports he snores will set up in future visit once he has Medicaid, currently has orange card.  5) Polysubstance abuse - He remains drug free.  Reports he has not used since 06/28/13 and congratulated patient on success. Continue nicotine patches and is on the last patch for taper.  6) Stroke (07/2013) - acute infraction with > 3 cm infarct involving L frontal opercular cortex and subcortical white matter. Continue statin and  ASA 7) DM2 - Followed up with PCP. Hgb A1C much improved.   F/U 1 month Joseph [name] BNP-C 9:52 AM

## 2013-10-27 NOTE — Patient Instructions (Signed)
Doing great.   Increase your coreg to 18.75 mg (1 1/2 tablets) in the morning and 18.75 mg (1 1/2 tablets) in the evening.  Follow up in 1 month  Do the following things EVERYDAY: 1) Weigh yourself in the morning before breakfast. Write it down and keep it in a log. 2) Take your medicines as prescribed 3) Eat low salt foods-Limit salt (sodium) to 2000 mg per day.  4) Stay as active as you can everyday 5) Limit all fluids for the day to less than 2 liters 6)

## 2013-10-28 ENCOUNTER — Ambulatory Visit (INDEPENDENT_AMBULATORY_CARE_PROVIDER_SITE_OTHER): Payer: No Typology Code available for payment source | Admitting: Home Health Services

## 2013-10-28 DIAGNOSIS — E119 Type 2 diabetes mellitus without complications: Secondary | ICD-10-CM

## 2013-10-28 LAB — HM DIABETES EYE EXAM

## 2013-10-28 NOTE — Progress Notes (Signed)
DIABETES Pt came in to have a retinal scan per diabetic care.   Image was taken and submitted to [facility]-DR. Cathren Laine for reading.    Results will be available in 1-2 weeks.  Results will be given to PCP for review and to contact patient.  Vinnie Level

## 2013-11-01 ENCOUNTER — Encounter: Payer: Self-pay | Admitting: Family Medicine

## 2013-11-02 ENCOUNTER — Encounter: Payer: Self-pay | Admitting: Family Medicine

## 2013-11-09 ENCOUNTER — Encounter: Payer: Self-pay | Admitting: *Deleted

## 2013-11-11 ENCOUNTER — Encounter: Payer: Self-pay | Admitting: Internal Medicine

## 2013-11-11 ENCOUNTER — Ambulatory Visit (INDEPENDENT_AMBULATORY_CARE_PROVIDER_SITE_OTHER): Payer: No Typology Code available for payment source | Admitting: Internal Medicine

## 2013-11-11 VITALS — BP 116/84 | HR 81 | Ht 65.0 in | Wt 188.0 lb

## 2013-11-11 DIAGNOSIS — I5022 Chronic systolic (congestive) heart failure: Secondary | ICD-10-CM

## 2013-11-11 DIAGNOSIS — Z01812 Encounter for preprocedural laboratory examination: Secondary | ICD-10-CM

## 2013-11-11 DIAGNOSIS — I428 Other cardiomyopathies: Secondary | ICD-10-CM

## 2013-11-11 NOTE — Progress Notes (Signed)
ELECTROPHYSIOLOGY CONSULT NOTE  Patient ID: Joseph [name], MRN: [id number], DOB/AGE: [birth year] 50 y.o. Admit date: (Not on file) Date of Consult: 11/11/2013  Primary Physician: Phill Myron, MD Primary Cardiologist: CHF  Chief Complaint: ICD   HPI Joseph [name] is a 48 y.o. male  Referred for consideration of an ICD. His history of nonischemic cardiomyopathy hypertension and diabetes. His history of a prior stroke with residual right-sided weakness.  He is able to walk more than 100 feet and is able to climb stairs. He does not have nocturnal dyspnea or orthopnea. He has had no peripheral edema.  He has no history of syncope he has had no palpitations.  Most recent ejection fraction 8/15 was 20-25%.  He was last seen by CHF and started on Entresto  He says he is currently not smoking taking drugs or alcohol    Past Medical History  Diagnosis Date  . Hypertension   . Shortness of breath   . High cholesterol   . CHF (congestive heart failure)   . Pneumonia ?2004  . Type II diabetes mellitus   . CAD (coronary artery disease)     a. 06/28/13 non obst dz. managed medically  . Non-ischemic cardiomyopathy     a. 06/28/13 ECHO: EF 25% and global hypokinesis and cath wuth EF 15%       Surgical History:  Past Surgical History  Procedure Laterality Date  . Finger amputation Right 1987    "reattached middle and ring fingers"  . Tee without cardioversion N/A 07/06/2013    Procedure: TRANSESOPHAGEAL ECHOCARDIOGRAM (TEE);  Surgeon: [name], MD;  Location[facility] ENDOSCOPY;  Service: Cardiovascular;  Laterality: N/A;     Home Meds: Prior to Admission medications   Medication Sig Start Date End Date Taking? Authorizing Provider  aspirin 81 MG EC tablet Take 1 tablet (81 mg total) by mouth daily. 07/21/13  Yes Rande Brunt, NP  atorvastatin (LIPITOR) 80 MG tablet Take 1 tablet (80 mg total) by mouth daily at 6 PM. 07/07/13  Yes Olam Idler, MD  Blood Glucose Monitoring  Suppl ([place]) [place] Patient will need to test his blood glucose once daily prior to breakfast and metformin and amaryl administration. 07/07/13  Yes Olam Idler, MD  carvedilol (COREG) 12.5 MG tablet Take 1.5 tablets (18.75 mg total) by mouth 2 (two) times daily with a meal. 10/27/13  Yes Rande Brunt, NP  furosemide (LASIX) 40 MG tablet Take 1 tablet (40 mg total) by mouth daily. 07/21/13  Yes Rande Brunt, NP  glucose blood test strip Use as instructed 07/07/13  Yes Olam Idler, MD  metFORMIN (GLUCOPHAGE) 500 MG tablet Take 2 tablets (1,000 mg total) by mouth 2 (two) times daily with a meal. 08/05/13  Yes Olam Idler, MD  ReliOn Ultra Thin Lancets [place] Patient will need to test his blood glucose once daily prior to breakfast and metformin and amaryl administration. 07/07/13  Yes Olam Idler, MD  Sacubitril-Valsartan (ENTRESTO) 24-26 MG TABS Take 1 tablet by mouth 2 (two) times daily.   Yes Historical Provider, MD  spironolactone (ALDACTONE) 25 MG tablet Take 0.5 tablets (12.5 mg total) by mouth daily. 08/19/13  Yes Rande Brunt, NP      Allergies: No Known Allergies  History   Social History  . Marital Status: Legally Separated    Spouse Name: N/A    Number of Children: N/A  . Years of Education: N/A  Occupational History  . Not on file.   Social History Main Topics  . Smoking status: Former Smoker -- 1.00 packs/day for 31 years    Types: Cigarettes  . Smokeless tobacco: Never Used  . Alcohol Use: 19.2 oz/week    21 Cans of beer, 11 Shots of liquor per week     Comment: 06/29/2013 "2-3 beers/day; maybe 1 pint/wk"  . Drug Use: Yes    Special: Cocaine     Comment: 06/29/2013 "last used cocaine ~ 06/25/2013; use it ~ once/month"  . Sexual Activity: Yes   Other Topics Concern  . Not on file   Social History Narrative  . No narrative on file     History reviewed. No pertinent family history.   ROS:  Please see the history of present illness.     All other  systems reviewed and negative.    Physical Exam:   Blood pressure 116/84, pulse 81, height 5\' 5"  (1.651 m), weight 188 lb (85.276 kg). General: Well developed, well nourished male in no acute distress. Head: Normocephalic, atraumatic, sclera non-icteric, no xanthomas, nares are without discharge. EENT: normal Lymph Nodes:  none Back: without scoliosis/kyphosis, no CVA tendersness Neck: Negative for carotid bruits. JVD not elevated. Lungs: Clear bilaterally to auscultation without wheezes, rales, or rhonchi. Breathing is unlabored. Heart: RRR with S1 S2. No murmur , rubs, or gallops appreciated. Abdomen: Soft, non-tender, non-distended with normoactive bowel sounds. No hepatomegaly. No rebound/guarding. No obvious abdominal masses. Msk:  Strength and tone appear normal for age. Extremities: No clubbing or cyanosis. No edema.  Distal pedal pulses are 2+ and equal bilaterally. Skin: Warm and Dry Neuro: Alert and oriented X 3. CN III-XII intact Grossly normal sensory and motor function . Psych:  Responds to questions appropriately with a normal affect.      Labs: Cardiac Enzymes No results found for this basename: CKTOTAL, CKMB, TROPONINI,  in the last 72 hours CBC Lab Results  Component Value Date   WBC 8.7 07/06/2013   HGB 15.9 07/06/2013   HCT 46.2 07/06/2013   MCV 100.2* 07/06/2013   PLT 182 07/06/2013   PROTIME: No results found for this basename: LABPROT, INR,  in the last 72 hours Chemistry No results found for this basename: NA, K, CL, CO2, BUN, CREATININE, CALCIUM, LABALBU, PROT, BILITOT, ALKPHOS, ALT, AST, GLUCOSE,  in the last 168 hours Lipids Lab Results  Component Value Date   CHOL 176 07/03/2013   HDL 69 07/03/2013   LDLCALC 80 07/03/2013   TRIG 137 07/03/2013   BNP Pro B Natriuretic peptide (BNP)  Date/Time Value Ref Range Status  06/28/2013  9:49 AM 955.8* 0 - 125 pg/mL Final   Miscellaneous Lab Results  Component Value Date   DDIMER 0.61* 06/28/2013     Radiology/Studies:  No results found.  EKG: Sinus and 81  16/10/38 Assessment and Plan:  Nonischemic cardiac myopathy  Congestive heart failure-chronic-systolic  LifeVest in place  The patient has persistent left ventricular dysfunction in the setting of nonischemic myopathy. This is despite guidelines directed medical therapy. He is appropriate to consider this time for ICD implantation for primary prevention. We have discussed subcutaneous versus transvenous ICD and he would like to pursue a transvenous device.  Have reviewed the potential benefits and risks of ICD implantation including but not limited to death, perforation of heart or lung, lead dislodgement, infection,  device malfunction and inappropriate shocks.  The patient and family express understanding  and are willing to proceed.  Virl Axe

## 2013-11-11 NOTE — Patient Instructions (Addendum)
Your physician recommends that you continue on your current medications as directed. Please refer to the Current Medication list given to you today.  Your physician has recommended that you have a defibrillator inserted. An implantable cardioverter defibrillator (ICD) is a small device that is placed in your chest or, in rare cases, your abdomen. This device uses electrical pulses or shocks to help control life-threatening, irregular heartbeats that could lead the heart to suddenly stop beating (sudden cardiac arrest). Leads are attached to the ICD that goes into your heart. This is done in the hospital and usually requires an overnight stay. Please see the instruction sheet given to you today for more information.  Pre-procedure lab work will be on November 24, 2013 (BMET, CBC) at [address]. Come anytime between 8am and 5PM.   You will have a wound check follow-up appointment on December 13, 2013 at 10:30am at [address].

## 2013-11-24 ENCOUNTER — Other Ambulatory Visit (INDEPENDENT_AMBULATORY_CARE_PROVIDER_SITE_OTHER): Payer: No Typology Code available for payment source

## 2013-11-24 DIAGNOSIS — Z01812 Encounter for preprocedural laboratory examination: Secondary | ICD-10-CM

## 2013-11-24 DIAGNOSIS — I428 Other cardiomyopathies: Secondary | ICD-10-CM

## 2013-11-24 DIAGNOSIS — I5022 Chronic systolic (congestive) heart failure: Secondary | ICD-10-CM

## 2013-11-24 LAB — BASIC METABOLIC PANEL
BUN: 15 mg/dL (ref 6–23)
CO2: 29 mEq/L (ref 19–32)
Calcium: 9.2 mg/dL (ref 8.4–10.5)
Chloride: 105 mEq/L (ref 96–112)
Creatinine, Ser: 1.1 mg/dL (ref 0.4–1.5)
GFR: 91.1 mL/min (ref >60.00)
Glucose, Bld: 181 mg/dL — ABNORMAL HIGH (ref 70–99)
Potassium: 4.4 mEq/L (ref 3.5–5.1)
Sodium: 137 mEq/L (ref 135–145)

## 2013-11-24 LAB — CBC WITH DIFFERENTIAL/PLATELET
Basophils Absolute: 0.1 10*3/uL (ref 0.0–0.1)
Basophils Relative: 0.5 % (ref 0.0–3.0)
Eosinophils Absolute: 0.6 10*3/uL (ref 0.0–0.7)
Eosinophils Relative: 5.5 % — ABNORMAL HIGH (ref 0.0–5.0)
HCT: 40.1 % (ref 39.0–52.0)
Hemoglobin: 13.3 g/dL (ref 13.0–17.0)
Lymphocytes Relative: 34.5 % (ref 12.0–46.0)
Lymphs Abs: 3.7 10*3/uL (ref 0.7–4.0)
MCHC: 33.2 g/dL (ref 30.0–36.0)
MCV: 97.5 fl (ref 78.0–100.0)
Monocytes Absolute: 0.8 10*3/uL (ref 0.1–1.0)
Monocytes Relative: 7.1 % (ref 3.0–12.0)
Neutro Abs: 5.7 10*3/uL (ref 1.4–7.7)
Neutrophils Relative %: 52.4 % (ref 43.0–77.0)
Platelets: 190 10*3/uL (ref 150.0–400.0)
RBC: 4.11 Mil/uL — ABNORMAL LOW (ref 4.22–5.81)
RDW: 15.4 % (ref 11.5–15.5)
WBC: 10.8 10*3/uL — ABNORMAL HIGH (ref 4.0–10.5)

## 2013-11-26 ENCOUNTER — Encounter (HOSPITAL_COMMUNITY): Payer: Self-pay | Admitting: Pharmacy Technician

## 2013-11-26 NOTE — Progress Notes (Signed)
Patient ID: Joseph [name], male   DOB: Jul 05, 1965, 48 y.o.   MRN: [id number]  PCP: Dr. Berkley Harvey [facility]) EP: Dr Caryl Comes   HPI: Joseph [name] is a 48 yo male with a history of chronic systolic HF, NICM, HTN, TIA, stroke, DM2,  tobacco abuse and polysubstance abuse.   Has been admitted 2 times in the past month for SOB and right sided weakness and slurred speech. Discharged 07/06/13 and last weight on chart 180 lbs.   R/LHC 06/30/13: RA: 6  RV: 26/9  PA: 26/13  PC: a 18 mean 13 CO/CI Fick: 3.7/2.0 1) Mild CAD with 30% narrowing in mid AV groove circumflex, and 30% proximal and 40% mid RCA stenosis with otherwise normal LAD and ramus  EF: 20%, mild MR, no embolus (06/2013) MRI head: acute infraction with > 3 cm infarct involving L frontal opercular cortex and subcortical white matter (07/2013) ECHO (10/13/13) EF 20-25% (10/2013)  Follow up for Heart Failure: Evaluated by EP for ICD with plans to implant soon. Plan for ICD 11/3013. Last visit carvedilol was increased to 18.75 mg twice a day. He has been off Entresto for 2 weeks because application for low income patients is pending.  Denies SOB/PND/Orthopnea. Able  to walk 30  minutes. He continues to wear Life Vest. Has not had alcohol or cigarettes in 3 months.   Labs: (07/21/13) K 4.3, creatinine 1.03 (08/27/13 ) K 4.5 Creatinine 0.91  11/24/13: K 4.4 Creatinine 1.1   SH: No smoking, no ETOH and no drugs as of 06/28/13. Not working. Applied for disability. Has orange card. Medicaid pending.   FH: Mother: DM2, living        Father deceased, no issues  ROS: All systems negative except as listed in HPI, PMH and Problem List.  PMH:  1) HTN 2) NICM: LHC (06/2013): Mild coronary obstructive disease with 30% narrowing in the mid AV groove circumflex, and 30% proximal and 40% mid RCA stenoses with otherwise normal LAD and ramus intermediate vessel 3) Chronic systolic HF: RHC (10/4130) RA: 6, RV: 26/9, PA: 26/13, PC: a 18 mean 13, CO/CI Fick:  3.7/2.0, left ventriculography EF 15%; ECHO (06/2013) EF 20%, diffuse HK, mild MR; TEE (07/2013) no thrombus; ECHO (10/2013): EF 20-25%, diff HK, LA mod dilated 4) DM2 5) Stroke: MRI (07/2013) revealed an acute infarction, with a greater than 3 cm infarct involving left frontal opercular cortex and subcortical white matter. Carotid dopplers (07/2013) mild soft plaque origin ICA. 1-39% ICA stenosis. Vertebral artery flow is antegrade 6) Polysubstance abuse: + cocaine UDS (last used April)    Current Outpatient Prescriptions  Medication Sig Dispense Refill  . aspirin 81 MG EC tablet Take 1 tablet (81 mg total) by mouth daily.  30 tablet  3  . atorvastatin (LIPITOR) 80 MG tablet Take 1 tablet (80 mg total) by mouth daily at 6 PM.  30 tablet  3  . Blood Glucose Monitoring Suppl ([place]) [place] Patient will need to test his blood glucose once daily prior to breakfast and metformin and amaryl administration.  1 Device  0  . carvedilol (COREG) 12.5 MG tablet Take 1.5 tablets (18.75 mg total) by mouth 2 (two) times daily with a meal.  90 tablet  3  . furosemide (LASIX) 40 MG tablet Take 1 tablet (40 mg total) by mouth daily.  30 tablet  3  . glucose blood test strip Use as instructed  100 each  12  . metFORMIN (GLUCOPHAGE) 500 MG tablet  Take 2 tablets (1,000 mg total) by mouth 2 (two) times daily with a meal.  120 tablet  2  . ReliOn Ultra Thin Lancets [place] Patient will need to test his blood glucose once daily prior to breakfast and metformin and amaryl administration.  100 each  2  . spironolactone (ALDACTONE) 25 MG tablet Take 0.5 tablets (12.5 mg total) by mouth daily.  15 tablet  3  . Sacubitril-Valsartan (ENTRESTO) 24-26 MG TABS Take 1 tablet by mouth 2 (two) times daily.       No current facility-administered medications for this encounter.     Filed Vitals:   11/29/13 0904  BP: 137/82  Pulse: 90  Resp: 18  Weight: 189 lb 4 oz (85.843 kg)  SpO2: 100%    PHYSICAL EXAM: General:   Well appearing. No resp difficulty. Girlfriend present  HEENT: normal Neck: supple. JVP flat. Carotids 2+ bilaterally; no bruits. No lymphadenopathy or thryomegaly appreciated. Cor: PMI normal. Regular rate & rhythm. No rubs, gallops or murmurs. Life Vest On  Lungs: clear Abdomen: soft, nontender, nondistended. No hepatosplenomegaly. No bruits or masses. Good bowel sounds. Extremities: no cyanosis, clubbing, rash, edema Neuro: alert & orientedx3, cranial nerves grossly intact. Moves all 4 extremities w/o difficulty. Affect pleasant.   ASSESSMENT & PLAN:  1) Chronic systolic HF: NICM, EF 94-70% (10/2013) - NYHA II symptoms and volume status stable. Will continue lasix 40 mg daily. Discussed the use of sliding scale of diuretics.  -Continue coreg to 18.75 mg BID, call any dizziness or fatigue. WIll not uptitrate today as he will be started on Entresto again.  - He has been on Entresto in 2 weeks.  Will contact [facility] [employer] today to find out where he is in the process for low income patients.    - Continue spiro 12.5 mg daily.  - Last EF less than 35% pending. Has been seen by EP.  Plan for 12/01/13 . Continue LifeVest until ICD implanted.  - Reinforced the need and importance of daily weights, a low sodium diet, and fluid restriction (less than 2 L a day). Instructed to call the HF clinic if weight increases more than 3 lbs overnight or 5 lbs in a week.  2) Non-obstructive CAD - No s/s of ischemia. Continue statin, ASA, ACE-I and BB.  3) HTN - Controlled. As above increase BB for LV dysfunction.  4) ?OSA - Sleep Study once Medicaid in place. Currently has orange card.  5) Polysubstance abuse - He remains drug free. 6) Stroke (07/2013) - acute infraction with > 3 cm infarct involving L frontal opercular cortex and subcortical white matter. Continue statin and ASA 7) DM2 - Followed up with PCP. Hgb A1C much improved.   Follow up in 2 weeks.  Faydra Korman,AMYNP-C 9:23 AM

## 2013-11-29 ENCOUNTER — Ambulatory Visit (HOSPITAL_COMMUNITY)
Admission: RE | Admit: 2013-11-29 | Discharge: 2013-11-29 | Disposition: A | Discharge status: Home / Self Care | Payer: No Typology Code available for payment source | Source: Ambulatory Visit | Attending: Internal Medicine | Admitting: Internal Medicine

## 2013-11-29 ENCOUNTER — Encounter (HOSPITAL_COMMUNITY): Payer: Self-pay

## 2013-11-29 VITALS — BP 137/82 | HR 90 | Resp 18 | Wt 189.2 lb

## 2013-11-29 DIAGNOSIS — I10 Essential (primary) hypertension: Secondary | ICD-10-CM | POA: Insufficient documentation

## 2013-11-29 DIAGNOSIS — E1165 Type 2 diabetes mellitus with hyperglycemia: Secondary | ICD-10-CM

## 2013-11-29 DIAGNOSIS — I509 Heart failure, unspecified: Secondary | ICD-10-CM | POA: Insufficient documentation

## 2013-11-29 DIAGNOSIS — I428 Other cardiomyopathies: Secondary | ICD-10-CM | POA: Insufficient documentation

## 2013-11-29 DIAGNOSIS — Z79899 Other long term (current) drug therapy: Secondary | ICD-10-CM | POA: Insufficient documentation

## 2013-11-29 DIAGNOSIS — IMO0001 Reserved for inherently not codable concepts without codable children: Secondary | ICD-10-CM

## 2013-11-29 DIAGNOSIS — Z7982 Long term (current) use of aspirin: Secondary | ICD-10-CM | POA: Insufficient documentation

## 2013-11-29 DIAGNOSIS — I5043 Acute on chronic combined systolic (congestive) and diastolic (congestive) heart failure: Secondary | ICD-10-CM

## 2013-11-29 DIAGNOSIS — F1910 Other psychoactive substance abuse, uncomplicated: Secondary | ICD-10-CM | POA: Insufficient documentation

## 2013-11-29 DIAGNOSIS — Z833 Family history of diabetes mellitus: Secondary | ICD-10-CM | POA: Insufficient documentation

## 2013-11-29 DIAGNOSIS — IMO0002 Reserved for concepts with insufficient information to code with codable children: Secondary | ICD-10-CM

## 2013-11-29 DIAGNOSIS — Z87891 Personal history of nicotine dependence: Secondary | ICD-10-CM | POA: Insufficient documentation

## 2013-11-29 DIAGNOSIS — E119 Type 2 diabetes mellitus without complications: Secondary | ICD-10-CM | POA: Insufficient documentation

## 2013-11-29 DIAGNOSIS — I5022 Chronic systolic (congestive) heart failure: Secondary | ICD-10-CM | POA: Insufficient documentation

## 2013-11-29 DIAGNOSIS — I2510 Atherosclerotic heart disease of native coronary artery without angina pectoris: Secondary | ICD-10-CM | POA: Insufficient documentation

## 2013-11-29 DIAGNOSIS — Z8673 Personal history of transient ischemic attack (TIA), and cerebral infarction without residual deficits: Secondary | ICD-10-CM | POA: Insufficient documentation

## 2013-11-29 NOTE — Patient Instructions (Signed)
Follow up in 2 weeks  Do the following things EVERYDAY: 1) Weigh yourself in the morning before breakfast. Write it down and keep it in a log. 2) Take your medicines as prescribed 3) Eat low salt foods--Limit salt (sodium) to 2000 mg per day.  4) Stay as active as you can everyday 5) Limit all fluids for the day to less than 2 liters 

## 2013-12-01 ENCOUNTER — Encounter (HOSPITAL_COMMUNITY)
Admission: RE | Disposition: A | Discharge status: Home / Self Care | Payer: Self-pay | Source: Ambulatory Visit | Attending: Internal Medicine

## 2013-12-01 ENCOUNTER — Encounter (HOSPITAL_COMMUNITY): Payer: Self-pay | Admitting: General Practice

## 2013-12-01 ENCOUNTER — Ambulatory Visit (HOSPITAL_COMMUNITY)
Admission: RE | Admit: 2013-12-01 | Discharge: 2013-12-02 | Disposition: A | Discharge status: Home / Self Care | Payer: Medicaid Other | Source: Ambulatory Visit | Attending: Internal Medicine | Admitting: Internal Medicine

## 2013-12-01 DIAGNOSIS — I10 Essential (primary) hypertension: Secondary | ICD-10-CM | POA: Insufficient documentation

## 2013-12-01 DIAGNOSIS — E7800 Pure hypercholesterolemia, unspecified: Secondary | ICD-10-CM | POA: Diagnosis not present

## 2013-12-01 DIAGNOSIS — Z87891 Personal history of nicotine dependence: Secondary | ICD-10-CM | POA: Diagnosis not present

## 2013-12-01 DIAGNOSIS — I5043 Acute on chronic combined systolic (congestive) and diastolic (congestive) heart failure: Secondary | ICD-10-CM

## 2013-12-01 DIAGNOSIS — Z7982 Long term (current) use of aspirin: Secondary | ICD-10-CM | POA: Insufficient documentation

## 2013-12-01 DIAGNOSIS — I509 Heart failure, unspecified: Secondary | ICD-10-CM | POA: Diagnosis not present

## 2013-12-01 DIAGNOSIS — Z95810 Presence of automatic (implantable) cardiac defibrillator: Secondary | ICD-10-CM

## 2013-12-01 DIAGNOSIS — R0602 Shortness of breath: Secondary | ICD-10-CM | POA: Insufficient documentation

## 2013-12-01 DIAGNOSIS — R29898 Other symptoms and signs involving the musculoskeletal system: Secondary | ICD-10-CM | POA: Diagnosis not present

## 2013-12-01 DIAGNOSIS — I2510 Atherosclerotic heart disease of native coronary artery without angina pectoris: Secondary | ICD-10-CM | POA: Diagnosis not present

## 2013-12-01 DIAGNOSIS — I420 Dilated cardiomyopathy: Secondary | ICD-10-CM | POA: Insufficient documentation

## 2013-12-01 DIAGNOSIS — Z79899 Other long term (current) drug therapy: Secondary | ICD-10-CM | POA: Diagnosis not present

## 2013-12-01 DIAGNOSIS — E119 Type 2 diabetes mellitus without complications: Secondary | ICD-10-CM | POA: Insufficient documentation

## 2013-12-01 DIAGNOSIS — I69998 Other sequelae following unspecified cerebrovascular disease: Secondary | ICD-10-CM | POA: Diagnosis not present

## 2013-12-01 DIAGNOSIS — I5022 Chronic systolic (congestive) heart failure: Secondary | ICD-10-CM | POA: Diagnosis not present

## 2013-12-01 DIAGNOSIS — E1165 Type 2 diabetes mellitus with hyperglycemia: Secondary | ICD-10-CM

## 2013-12-01 DIAGNOSIS — I69351 Hemiplegia and hemiparesis following cerebral infarction affecting right dominant side: Secondary | ICD-10-CM | POA: Insufficient documentation

## 2013-12-01 DIAGNOSIS — I428 Other cardiomyopathies: Secondary | ICD-10-CM

## 2013-12-01 DIAGNOSIS — IMO0002 Reserved for concepts with insufficient information to code with codable children: Secondary | ICD-10-CM

## 2013-12-01 DIAGNOSIS — Z23 Encounter for immunization: Secondary | ICD-10-CM | POA: Diagnosis not present

## 2013-12-01 HISTORY — PX: EP IMPLANTABLE DEVICE: SHX172B

## 2013-12-01 HISTORY — DX: Cerebral infarction, unspecified: I63.9

## 2013-12-01 HISTORY — DX: Presence of automatic (implantable) cardiac defibrillator: Z95.810

## 2013-12-01 HISTORY — PX: IMPLANTABLE CARDIOVERTER DEFIBRILLATOR IMPLANT: SHX5473

## 2013-12-01 LAB — GLUCOSE, CAPILLARY
GLUCOSE-CAPILLARY: 168 mg/dL — AB (ref 70–99)
GLUCOSE-CAPILLARY: 210 mg/dL — AB (ref 70–99)
Glucose-Capillary: 107 mg/dL — ABNORMAL HIGH (ref 70–99)

## 2013-12-01 LAB — SURGICAL PCR SCREEN
MRSA, PCR: NEGATIVE
Staphylococcus aureus: NEGATIVE

## 2013-12-01 SURGERY — IMPLANTABLE CARDIOVERTER DEFIBRILLATOR IMPLANT
Anesthesia: LOCAL

## 2013-12-01 MED ORDER — ONDANSETRON HCL 4 MG/2ML IJ SOLN: 4.0000 mg | Freq: Four times a day (QID) | INTRAMUSCULAR | Status: DC | PRN

## 2013-12-01 MED ORDER — FUROSEMIDE 40 MG PO TABS
40.0000 mg | ORAL_TABLET | Freq: Every day | ORAL | Status: DC
Start: 2013-12-01 — End: 2013-12-02
  Administered 2013-12-01 – 2013-12-02 (×2): 40 mg via ORAL
  Filled 2013-12-01 (×2): qty 1

## 2013-12-01 MED ORDER — HEPARIN (PORCINE) IN NACL 2-0.9 UNIT/ML-% IJ SOLN
INTRAMUSCULAR | Status: AC
  Filled 2013-12-01: qty 500

## 2013-12-01 MED ORDER — LIDOCAINE HCL (PF) 1 % IJ SOLN
INTRAMUSCULAR | Status: AC
Start: 2013-12-01 — End: 2013-12-01
  Filled 2013-12-01: qty 30

## 2013-12-01 MED ORDER — SODIUM CHLORIDE 0.9 % IV SOLN: INTRAVENOUS | Status: DC

## 2013-12-01 MED ORDER — MIDAZOLAM HCL 5 MG/5ML IJ SOLN
INTRAMUSCULAR | Status: AC
  Filled 2013-12-01: qty 5

## 2013-12-01 MED ORDER — MUPIROCIN 2 % EX OINT
TOPICAL_OINTMENT | CUTANEOUS | Status: AC
  Filled 2013-12-01: qty 22

## 2013-12-01 MED ORDER — INFLUENZA VAC SPLIT QUAD 0.5 ML IM SUSY
0.5000 mL | PREFILLED_SYRINGE | INTRAMUSCULAR | Status: AC
  Administered 2013-12-02: 0.5 mL via INTRAMUSCULAR
  Filled 2013-12-01: qty 0.5

## 2013-12-01 MED ORDER — ATORVASTATIN CALCIUM 80 MG PO TABS
80.0000 mg | ORAL_TABLET | Freq: Every day | ORAL | Status: DC
  Administered 2013-12-01 – 2013-12-02 (×2): 80 mg via ORAL
  Filled 2013-12-01 (×2): qty 1

## 2013-12-01 MED ORDER — MUPIROCIN 2 % EX OINT: 1.0000 "application " | TOPICAL_OINTMENT | Freq: Once | CUTANEOUS | Status: DC

## 2013-12-01 MED ORDER — SPIRONOLACTONE 12.5 MG HALF TABLET
12.5000 mg | ORAL_TABLET | Freq: Every day | ORAL | Status: DC
  Administered 2013-12-01 – 2013-12-02 (×2): 12.5 mg via ORAL
  Filled 2013-12-01 (×2): qty 1

## 2013-12-01 MED ORDER — SODIUM CHLORIDE 0.9 % IV SOLN
INTRAVENOUS | Status: AC
  Administered 2013-12-01: 17:00:00 via INTRAVENOUS

## 2013-12-01 MED ORDER — SACUBITRIL-VALSARTAN 24-26 MG PO TABS
1.0000 | ORAL_TABLET | Freq: Two times a day (BID) | ORAL | Status: DC
  Filled 2013-12-01 (×4): qty 1

## 2013-12-01 MED ORDER — CHLORHEXIDINE GLUCONATE 4 % EX LIQD: 60.0000 mL | Freq: Once | CUTANEOUS | Status: DC

## 2013-12-01 MED ORDER — METFORMIN HCL 500 MG PO TABS
1000.0000 mg | ORAL_TABLET | Freq: Two times a day (BID) | ORAL | Status: DC
  Administered 2013-12-01 – 2013-12-02 (×2): 1000 mg via ORAL
  Filled 2013-12-01 (×4): qty 2

## 2013-12-01 MED ORDER — FENTANYL CITRATE 0.05 MG/ML IJ SOLN
INTRAMUSCULAR | Status: AC
  Filled 2013-12-01: qty 2

## 2013-12-01 MED ORDER — ASPIRIN 81 MG PO TBEC
81.0000 mg | DELAYED_RELEASE_TABLET | Freq: Every day | ORAL | Status: DC
  Administered 2013-12-01 – 2013-12-02 (×2): 81 mg via ORAL
  Filled 2013-12-01 (×2): qty 1

## 2013-12-01 MED ORDER — CARVEDILOL 6.25 MG PO TABS
18.7500 mg | ORAL_TABLET | Freq: Two times a day (BID) | ORAL | Status: DC
  Administered 2013-12-01 – 2013-12-02 (×2): 18.75 mg via ORAL
  Filled 2013-12-01 (×4): qty 1

## 2013-12-01 MED ORDER — CEFAZOLIN SODIUM-DEXTROSE 2-3 GM-% IV SOLR: 2.0000 g | INTRAVENOUS | Status: DC

## 2013-12-01 MED ORDER — SODIUM CHLORIDE 0.9 % IR SOLN
80.0000 mg | Status: DC
  Filled 2013-12-01: qty 2

## 2013-12-01 MED ORDER — ACETAMINOPHEN 325 MG PO TABS: 325.0000 mg | ORAL_TABLET | ORAL | Status: DC | PRN

## 2013-12-01 MED ORDER — CEFAZOLIN SODIUM 1-5 GM-% IV SOLN
1.0000 g | Freq: Four times a day (QID) | INTRAVENOUS | Status: AC
  Administered 2013-12-01 – 2013-12-02 (×3): 1 g via INTRAVENOUS
  Filled 2013-12-01 (×3): qty 50

## 2013-12-01 NOTE — H&P (View-Only) (Signed)
ELECTROPHYSIOLOGY CONSULT NOTE  Patient ID: Joseph [name], MRN: [id number], DOB/AGE: [birth year] 48 y.o. Admit date: (Not on file) Date of Consult: 11/11/2013  Primary Physician: Phill Myron, MD Primary Cardiologist: CHF  Chief Complaint: ICD   HPI Joseph [name] is a 48 y.o. male  Referred for consideration of an ICD. His history of nonischemic cardiomyopathy hypertension and diabetes. His history of a prior stroke with residual right-sided weakness.  He is able to walk more than 100 feet and is able to climb stairs. He does not have nocturnal dyspnea or orthopnea. He has had no peripheral edema.  He has no history of syncope he has had no palpitations.  Most recent ejection fraction 8/15 was 20-25%.  He was last seen by CHF and started on Entresto  He says he is currently not smoking taking drugs or alcohol    Past Medical History  Diagnosis Date  . Hypertension   . Shortness of breath   . High cholesterol   . CHF (congestive heart failure)   . Pneumonia ?2004  . Type II diabetes mellitus   . CAD (coronary artery disease)     a. 06/28/13 non obst dz. managed medically  . Non-ischemic cardiomyopathy     a. 06/28/13 ECHO: EF 25% and global hypokinesis and cath wuth EF 15%       Surgical History:  Past Surgical History  Procedure Laterality Date  . Finger amputation Right 1987    "reattached middle and ring fingers"  . Tee without cardioversion N/A 07/06/2013    Procedure: TRANSESOPHAGEAL ECHOCARDIOGRAM (TEE);  Surgeon: [name], MD;  Location: [facility] ENDOSCOPY;  Service: Cardiovascular;  Laterality: N/A;     Home Meds: Prior to Admission medications   Medication Sig Start Date End Date Taking? Authorizing Provider  aspirin 81 MG EC tablet Take 1 tablet (81 mg total) by mouth daily. 07/21/13  Yes Rande Brunt, NP  atorvastatin (LIPITOR) 80 MG tablet Take 1 tablet (80 mg total) by mouth daily at 6 PM. 07/07/13  Yes Olam Idler, MD  Blood Glucose Monitoring  Suppl ([place]) [place] Patient will need to test his blood glucose once daily prior to breakfast and metformin and amaryl administration. 07/07/13  Yes Olam Idler, MD  carvedilol (COREG) 12.5 MG tablet Take 1.5 tablets (18.75 mg total) by mouth 2 (two) times daily with a meal. 10/27/13  Yes Rande Brunt, NP  furosemide (LASIX) 40 MG tablet Take 1 tablet (40 mg total) by mouth daily. 07/21/13  Yes Rande Brunt, NP  glucose blood test strip Use as instructed 07/07/13  Yes Olam Idler, MD  metFORMIN (GLUCOPHAGE) 500 MG tablet Take 2 tablets (1,000 mg total) by mouth 2 (two) times daily with a meal. 08/05/13  Yes Olam Idler, MD  ReliOn Ultra Thin Lancets [place] Patient will need to test his blood glucose once daily prior to breakfast and metformin and amaryl administration. 07/07/13  Yes Olam Idler, MD  Sacubitril-Valsartan (ENTRESTO) 24-26 MG TABS Take 1 tablet by mouth 2 (two) times daily.   Yes Historical Provider, MD  spironolactone (ALDACTONE) 25 MG tablet Take 0.5 tablets (12.5 mg total) by mouth daily. 08/19/13  Yes Rande Brunt, NP      Allergies: No Known Allergies  History   Social History  . Marital Status: Legally Separated    Spouse Name: N/A    Number of Children: N/A  . Years of Education: N/A  Occupational History  . Not on file.   Social History Main Topics  . Smoking status: Former Smoker -- 1.00 packs/day for 31 years    Types: Cigarettes  . Smokeless tobacco: Never Used  . Alcohol Use: 19.2 oz/week    21 Cans of beer, 11 Shots of liquor per week     Comment: 06/29/2013 "2-3 beers/day; maybe 1 pint/wk"  . Drug Use: Yes    Special: Cocaine     Comment: 06/29/2013 "last used cocaine ~ 06/25/2013; use it ~ once/month"  . Sexual Activity: Yes   Other Topics Concern  . Not on file   Social History Narrative  . No narrative on file     History reviewed. No pertinent family history.   ROS:  Please see the history of present illness.     All other  systems reviewed and negative.    Physical Exam:   Blood pressure 116/84, pulse 81, height 5\' 5"  (1.651 m), weight 188 lb (85.276 kg). General: Well developed, well nourished male in no acute distress. Head: Normocephalic, atraumatic, sclera non-icteric, no xanthomas, nares are without discharge. EENT: normal Lymph Nodes:  none Back: without scoliosis/kyphosis, no CVA tendersness Neck: Negative for carotid bruits. JVD not elevated. Lungs: Clear bilaterally to auscultation without wheezes, rales, or rhonchi. Breathing is unlabored. Heart: RRR with S1 S2. No murmur , rubs, or gallops appreciated. Abdomen: Soft, non-tender, non-distended with normoactive bowel sounds. No hepatomegaly. No rebound/guarding. No obvious abdominal masses. Msk:  Strength and tone appear normal for age. Extremities: No clubbing or cyanosis. No edema.  Distal pedal pulses are 2+ and equal bilaterally. Skin: Warm and Dry Neuro: Alert and oriented X 3. CN III-XII intact Grossly normal sensory and motor function . Psych:  Responds to questions appropriately with a normal affect.      Labs: Cardiac Enzymes No results found for this basename: CKTOTAL, CKMB, TROPONINI,  in the last 72 hours CBC Lab Results  Component Value Date   WBC 8.7 07/06/2013   HGB 15.9 07/06/2013   HCT 46.2 07/06/2013   MCV 100.2* 07/06/2013   PLT 182 07/06/2013   PROTIME: No results found for this basename: LABPROT, INR,  in the last 72 hours Chemistry No results found for this basename: NA, K, CL, CO2, BUN, CREATININE, CALCIUM, LABALBU, PROT, BILITOT, ALKPHOS, ALT, AST, GLUCOSE,  in the last 168 hours Lipids Lab Results  Component Value Date   CHOL 176 07/03/2013   HDL 69 07/03/2013   LDLCALC 80 07/03/2013   TRIG 137 07/03/2013   BNP Pro B Natriuretic peptide (BNP)  Date/Time Value Ref Range Status  06/28/2013  9:49 AM 955.8* 0 - 125 pg/mL Final   Miscellaneous Lab Results  Component Value Date   DDIMER 0.61* 06/28/2013     Radiology/Studies:  No results found.  EKG: Sinus and 81  16/10/38 Assessment and Plan:  Nonischemic cardiac myopathy  Congestive heart failure-chronic-systolic  LifeVest in place  The patient has persistent left ventricular dysfunction in the setting of nonischemic myopathy. This is despite guidelines directed medical therapy. He is appropriate to consider this time for ICD implantation for primary prevention. We have discussed subcutaneous versus transvenous ICD and he would like to pursue a transvenous device.  Have reviewed the potential benefits and risks of ICD implantation including but not limited to death, perforation of heart or lung, lead dislodgement, infection,  device malfunction and inappropriate shocks.  The patient and family express understanding  and are willing to proceed.  Virl Axe

## 2013-12-01 NOTE — Interval H&P Note (Signed)
ICD Criteria  Current LVEF:20% ;Obtained > or = 1 month ago and < or = 3 months ago.  NYHA Functional Classification: Class II  Heart Failure History:  Yes, Duration of heart failure since onset is 3 to 9 months  Non-Ischemic Dilated Cardiomyopathy History:  Yes, timeframe is 3 to 9 months  Atrial Fibrillation/Atrial Flutter:  No.  Ventricular Tachycardia History:  No.  Cardiac Arrest History:  No  History of Syndromes with Risk of Sudden Death:  No.  Previous ICD:  No.  Electrophysiology Study: No.  Prior MI: No.  PPM: No.  OSA:  No  Patient Life Expectancy of >=1 year: Yes.  Anticoagulation Therapy:  Patient is NOT on anticoagulation therapy.   Beta Blocker Therapy:  Yes.   Ace Inhibitor/ARB Therapy:  Yes.      History and Physical Interval Note:  12/01/2013 11:29 AM  Joseph [name]  has presented today for surgery, with the diagnosis of CHF  The various methods of treatment have been discussed with the patient and family. After consideration of risks, benefits and other options for treatment, the patient has consented to  Procedure(s): IMPLANTABLE CARDIOVERTER DEFIBRILLATOR IMPLANT (N/A) as a surgical intervention .  The patient's history has been reviewed, patient examined, no change in status, stable for surgery.  I have reviewed the patient's chart and labs.  Questions were answered to the patient's satisfaction.     Joseph [name]

## 2013-12-01 NOTE — CV Procedure (Signed)
JAYMEN FETCH [id number]  [id number]  Preop DU:[id number] cardiomyopathy Postop Dx same/    Procedure: single chamber ICD implantation with out intraoperative defibrillation threshold testing  Following the obtaining of informed consent the patient was brought to the electrophysiology laboratory in place of the fluoroscopic table in the supine position. After routine prep and drape, lidocaine was infiltrated in the prepectoral subclavicular region and an incision was made and carried down to the layer of the prepectoral fascia using electrocautery and sharp dissection. A pocket was formed similarly.  Thereafter an attention was turned to gain access to the extrathoracic left subclavian vein which was accomplished with difficulty and without the aspiration of air or puncture of the artery. A 9 French sheath was placed for which was then passed a [employer]   single coil  active fixation defibrillator lead, model [id number] serial number [id number]. It was  passed under fluoroscopic guidance to the right ventricular apex. In its location the bipolar R wave was 12.8 millivolts, impedance was 670 ohms, the pacing threshold was 0.6 volts at 0.5 msec. Current at threshold was 1.0 mA.  There was no diaphragmatic pacing at 10 V. The current of injury was brisk .  The lead was secured to the prepectoral fascia and then attached to a [place], serial number  [id number].  Through the device, the bipolar R wave was 17.4 millivolts, impedance was 562 ohms, the pacing threshold was 0.5 volts at 0.4 msec. High-voltage impedance was 63 ohms.  The pocket was copiously irrigated with antibiotic containing saline solution. Hemostasis was assured, and the device and the lead was placed in the pocket and secured to the prepectoral fascia.  The wound was closed in 2  layers in normal fashion. The wound was washed dried and a DERMABOND  was then applied. Needle counts sponge counts and instrument counts were  correct at the end of the procedure according to the staff.   Patient tolerated the procedure without apparent complication  EBL  Minimal     Cx: None     Virl Axe, MD 12/01/2013 2:46 PM

## 2013-12-02 ENCOUNTER — Ambulatory Visit (HOSPITAL_COMMUNITY): Payer: Medicaid Other

## 2013-12-02 DIAGNOSIS — I429 Cardiomyopathy, unspecified: Secondary | ICD-10-CM

## 2013-12-02 DIAGNOSIS — I10 Essential (primary) hypertension: Secondary | ICD-10-CM | POA: Diagnosis not present

## 2013-12-02 DIAGNOSIS — I420 Dilated cardiomyopathy: Secondary | ICD-10-CM | POA: Diagnosis not present

## 2013-12-02 DIAGNOSIS — Z87891 Personal history of nicotine dependence: Secondary | ICD-10-CM | POA: Diagnosis not present

## 2013-12-02 DIAGNOSIS — Z79899 Other long term (current) drug therapy: Secondary | ICD-10-CM | POA: Diagnosis not present

## 2013-12-02 DIAGNOSIS — I5022 Chronic systolic (congestive) heart failure: Secondary | ICD-10-CM | POA: Diagnosis not present

## 2013-12-02 DIAGNOSIS — E119 Type 2 diabetes mellitus without complications: Secondary | ICD-10-CM | POA: Diagnosis not present

## 2013-12-02 DIAGNOSIS — I69351 Hemiplegia and hemiparesis following cerebral infarction affecting right dominant side: Secondary | ICD-10-CM | POA: Diagnosis not present

## 2013-12-02 DIAGNOSIS — Z7982 Long term (current) use of aspirin: Secondary | ICD-10-CM | POA: Diagnosis not present

## 2013-12-02 DIAGNOSIS — R0602 Shortness of breath: Secondary | ICD-10-CM | POA: Diagnosis not present

## 2013-12-02 DIAGNOSIS — I2510 Atherosclerotic heart disease of native coronary artery without angina pectoris: Secondary | ICD-10-CM | POA: Diagnosis not present

## 2013-12-02 DIAGNOSIS — E780 Pure hypercholesterolemia: Secondary | ICD-10-CM | POA: Diagnosis not present

## 2013-12-02 DIAGNOSIS — Z23 Encounter for immunization: Secondary | ICD-10-CM | POA: Diagnosis not present

## 2013-12-02 LAB — GLUCOSE, CAPILLARY: GLUCOSE-CAPILLARY: 185 mg/dL — AB (ref 70–99)

## 2013-12-02 NOTE — Progress Notes (Signed)
Reviewed discharge instructions with patient and wife and they stated their understanding.  Discharged home with wife via wheelchair.   ICD instructions reviewed with patient.  Filed Vitals:   12/02/13 0624  BP: 129/85  Pulse: 78  Temp: 98.2 F (36.8 C)  Resp: 18    Damico Partin, Harlow Mares

## 2013-12-02 NOTE — Discharge Summary (Signed)
ELECTROPHYSIOLOGY PROCEDURE DISCHARGE SUMMARY    Patient ID: Joseph [name],  MRN: [id number], DOB/AGE: 1965-12-31 48 y.o.  Admit date: 12/01/2013 Discharge date: 12/02/2013  Primary Care Physician: Phill Myron, MD Primary Cardiologist: Aundra Dubin Electrophysiologist: Caryl Comes  Primary Discharge Diagnosis:  NICM status post ICD implantation this admission  Secondary Discharge Diagnosis:  1.  Chronic systolic heart failure 2.  Hypertension 3.  Prior CVA 4.  Diabetes 5.  Tobacco abuse 6.  Sleep disordered breathing - pending sleep evaluation   No Known Allergies   Procedures This Admission:  1.  Implantation of a BSX single chamber ICD on 12-01-13 by Dr Caryl Comes.  See op note for full details.  DFT's were deferred at time of implant.  There were no immediate post procedure complications. 2.  CXR on 12-02-13 demonstrated no pneumothorax status post device implantation.   Brief HPI: Joseph [name] is a 48 y.o. male was referred to electrophysiology in the outpatient setting for consideration of ICD implantation.  Past medical history includes NICM, chronic systolic heart failure, diabetes, hypertension.  The patient has persistent LV dysfunction despite guideline directed therapy.  Risks, benefits, and alternatives to ICD implantation were reviewed with the patient who wished to proceed.   Hospital Course:  The patient was admitted and underwent implantation of a BSX ICD with details as outlined above.   He was monitored on telemetry overnight which demonstrated sinus rhythm with PVC's.  Left chest was without hematoma or ecchymosis.  The device was interrogated and found to be functioning normally.  CXR was obtained and demonstrated no pneumothorax status post device implantation.  Wound care, arm mobility, and restrictions were reviewed with the patient.  Dr Caryl Comes examined the patient and considered them stable for discharge to home.   The patient's discharge medications include an ARB  (Valsartan) and beta blocker (Carvedilol).   Discharge Vitals: Blood pressure 129/85, pulse 78, temperature 98.2 F (36.8 C), temperature source Oral, resp. rate 18, height 5\' 5"  (1.651 m), weight 194 lb 9.6 oz (88.27 kg), SpO2 98.00%.   Labs:   Lab Results  Component Value Date   WBC 10.8* 11/24/2013   HGB 13.3 11/24/2013   HCT 40.1 11/24/2013   MCV 97.5 11/24/2013   PLT 190.0 11/24/2013   No results found for this basename: NA, K, CL, CO2, BUN, CREATININE, CALCIUM, LABALBU, PROT, BILITOT, ALKPHOS, ALT, AST, GLUCOSE,  in the last 168 hours   Discharge Medications:    Medication List    ASK your doctor about these medications       aspirin 81 MG EC tablet  Take 1 tablet (81 mg total) by mouth daily.     atorvastatin 80 MG tablet  Commonly known as:  LIPITOR  Take 1 tablet (80 mg total) by mouth daily at 6 PM.     carvedilol 12.5 MG tablet  Commonly known as:  COREG  Take 1.5 tablets (18.75 mg total) by mouth 2 (two) times daily with a meal.     ENTRESTO 24-26 MG Tabs  Generic drug:  Sacubitril-Valsartan  Take 1 tablet by mouth 2 (two) times daily.     furosemide 40 MG tablet  Commonly known as:  LASIX  Take 1 tablet (40 mg total) by mouth daily.     glucose blood test strip  Use as instructed     metFORMIN 500 MG tablet  Commonly known as:  GLUCOPHAGE  Take 2 tablets (1,000 mg total) by mouth 2 (two) times daily with  a meal.     [place]  Patient will need to test his blood glucose once daily prior to breakfast and metformin and amaryl administration.     ReliOn Ultra Thin Lancets Misc  Patient will need to test his blood glucose once daily prior to breakfast and metformin and amaryl administration.     spironolactone 25 MG tablet  Commonly known as:  ALDACTONE  Take 0.5 tablets (12.5 mg total) by mouth daily.       Device function normal  Disposition: home    Duration of Discharge Encounter: Greater than 30 minutes including physician  time.  Signed,

## 2013-12-02 NOTE — Discharge Instructions (Signed)
Supplemental Discharge Instructions for  Pacemaker/Defibrillator Patients  Activity No heavy lifting or vigorous activity with your left/right arm for 6 to 8 weeks.  Do not raise your left/right arm above your head for one week.  Gradually raise your affected arm as drawn below.                       10/04                   10/05                    10/06                    10/07            NO DRIVING for  1 week    ; you may begin driving on     58/09     . WOUND CARE   Keep the wound area clean and dry.  Do not get this area wet for one week. No showers for one week; you may shower on      10/08        .   The tape/steri-strips on your wound will fall off; do not pull them off.  No bandage is needed on the site.  DO  NOT apply any creams, oils, or ointments to the wound area.   If you notice any drainage or discharge from the wound, any swelling or bruising at the site, or you develop a fever > 101? F after you are discharged home, call the office at once.  Special Instructions   You are still able to use cellular telephones; use the ear opposite the side where you have your pacemaker/defibrillator.  Avoid carrying your cellular phone near your device.   When traveling through airports, show security personnel your identification card to avoid being screened in the metal detectors.  Ask the security personnel to use the hand wand.   Avoid arc welding equipment, MRI testing (magnetic resonance imaging), TENS units (transcutaneous nerve stimulators).  Call the office for questions about other devices.   Avoid electrical appliances that are in poor condition or are not properly grounded.   Microwave ovens are safe to be near or to operate.  Additional information for defibrillator patients should your device go off:   If your device goes off ONCE and you feel fine afterward, notify the device clinic nurses.   If your device goes off ONCE and you do not feel well afterward, call 911.   If  your device goes off TWICE, call 911.   If your device goes off THREE times in one day, call 911.  DO NOT DRIVE YOURSELF OR A FAMILY MEMBER WITH A DEFIBRILLATOR TO THE HOSPITAL--CALL 911.   Heart Failure Heart failure means your heart has trouble pumping blood. This makes it hard for your body to work well. Heart failure is usually a long-term (chronic) condition. You must take good care of yourself and follow your doctor's treatment plan. HOME CARE  Take your heart medicine as told by your doctor.  Do not stop taking medicine unless your doctor tells you to.  Do not skip any dose of medicine.  Refill your medicines before they run out.  Take other medicines only as told by your doctor or pharmacist.  Stay active if told by your doctor. The elderly and people with severe heart failure should talk with  a doctor about physical activity.  Eat heart-healthy foods. Choose foods that are without trans fat and are low in saturated fat, cholesterol, and salt (sodium). This includes fresh or frozen fruits and vegetables, fish, lean meats, fat-free or low-fat dairy foods, whole grains, and high-fiber foods. Lentils and dried peas and beans (legumes) are also good choices.  Limit salt if told by your doctor.  Cook in a healthy way. Roast, grill, broil, bake, poach, steam, or stir-fry foods.  Limit fluids as told by your doctor.  Weigh yourself every morning. Do this after you pee (urinate) and before you eat breakfast. Write down your weight to give to your doctor.  Take your blood pressure and write it down if your doctor tells you to.  Ask your doctor how to check your pulse. Check your pulse as told.  Lose weight if told by your doctor.  Stop smoking or chewing tobacco. Do not use gum or patches that help you quit without your doctor's approval.  Schedule and go to doctor visits as told.  Nonpregnant women should have no more than 1 drink a day. Men should have no more than 2  drinks a day. Talk to your doctor about drinking alcohol.  Stop illegal drug use.  Stay current with shots (immunizations).  Manage your health conditions as told by your doctor.  Learn to manage your stress.  Rest when you are tired.  If it is really hot outside:  Avoid intense activities.  Use air conditioning or fans, or get in a cooler place.  Avoid caffeine and alcohol.  Wear loose-fitting, lightweight, and light-colored clothing.  If it is really cold outside:  Avoid intense activities.  Layer your clothing.  Wear mittens or gloves, a hat, and a scarf when going outside.  Avoid alcohol.  Learn about heart failure and get support as needed.  Get help to maintain or improve your quality of life and your ability to care for yourself as needed. GET HELP IF:   You gain 03 lb/1.4 kg or more in 1 day or 05 lb/2.3 kg in a week.  You are more short of breath than usual.  You cannot do your normal activities.  You tire easily.  You cough more than normal, especially with activity.  You have any or more puffiness (swelling) in areas such as your hands, feet, ankles, or belly (abdomen).  You cannot sleep because it is hard to breathe.  You feel like your heart is beating fast (palpitations).  You get dizzy or light-headed when you stand up. GET HELP RIGHT AWAY IF:   You have trouble breathing.  There is a change in mental status, such as becoming less alert or not being able to focus.  You have chest pain or discomfort.  You faint. MAKE SURE YOU:   Understand these instructions.  Will watch your condition.  Will get help right away if you are not doing well or get worse. Document Released: 11/28/2007 Document Revised: 07/05/2013 Document Reviewed: 04/06/2012 [facility] Patient Information 2015 [place], [place]. This information is not intended to replace advice given to you by your health care provider. Make sure you discuss any questions you have with  your health care provider.

## 2013-12-09 ENCOUNTER — Encounter (HOSPITAL_COMMUNITY): Payer: Self-pay

## 2013-12-09 ENCOUNTER — Ambulatory Visit (HOSPITAL_COMMUNITY)
Admission: RE | Admit: 2013-12-09 | Discharge: 2013-12-09 | Disposition: A | Discharge status: Home / Self Care | Payer: Medicaid Other | Source: Ambulatory Visit | Attending: Internal Medicine | Admitting: Internal Medicine

## 2013-12-09 VITALS — BP 137/91 | HR 86 | Resp 18 | Wt 188.1 lb

## 2013-12-09 DIAGNOSIS — I2510 Atherosclerotic heart disease of native coronary artery without angina pectoris: Secondary | ICD-10-CM | POA: Diagnosis not present

## 2013-12-09 DIAGNOSIS — I10 Essential (primary) hypertension: Secondary | ICD-10-CM | POA: Insufficient documentation

## 2013-12-09 DIAGNOSIS — E119 Type 2 diabetes mellitus without complications: Secondary | ICD-10-CM | POA: Insufficient documentation

## 2013-12-09 DIAGNOSIS — I5022 Chronic systolic (congestive) heart failure: Secondary | ICD-10-CM

## 2013-12-09 DIAGNOSIS — Z7982 Long term (current) use of aspirin: Secondary | ICD-10-CM | POA: Insufficient documentation

## 2013-12-09 DIAGNOSIS — I639 Cerebral infarction, unspecified: Secondary | ICD-10-CM

## 2013-12-09 DIAGNOSIS — Z87891 Personal history of nicotine dependence: Secondary | ICD-10-CM | POA: Diagnosis not present

## 2013-12-09 DIAGNOSIS — Z79899 Other long term (current) drug therapy: Secondary | ICD-10-CM | POA: Diagnosis not present

## 2013-12-09 DIAGNOSIS — I429 Cardiomyopathy, unspecified: Secondary | ICD-10-CM | POA: Diagnosis not present

## 2013-12-09 DIAGNOSIS — Z87898 Personal history of other specified conditions: Secondary | ICD-10-CM | POA: Diagnosis not present

## 2013-12-09 DIAGNOSIS — Z8673 Personal history of transient ischemic attack (TIA), and cerebral infarction without residual deficits: Secondary | ICD-10-CM | POA: Diagnosis not present

## 2013-12-09 LAB — BASIC METABOLIC PANEL
ANION GAP: 12 (ref 5–15)
BUN: 13 mg/dL (ref 6–23)
CALCIUM: 9.6 mg/dL (ref 8.4–10.5)
CO2: 24 mEq/L (ref 19–32)
CREATININE: 0.8 mg/dL (ref 0.50–1.35)
Chloride: 102 mEq/L (ref 96–112)
GFR calc Af Amer: >90 mL/min (ref >90)
GFR calc non Af Amer: >90 mL/min (ref >90)
GLUCOSE: 186 mg/dL — AB (ref 70–99)
Potassium: 4.1 mEq/L (ref 3.7–5.3)
Sodium: 138 mEq/L (ref 137–147)

## 2013-12-09 MED ORDER — CARVEDILOL 25 MG PO TABS: 25.0000 mg | ORAL_TABLET | Freq: Two times a day (BID) | ORAL | Status: DC

## 2013-12-09 NOTE — Progress Notes (Signed)
Patient ID: Joseph Joseph [name], Joseph [name]   DOB: 1965/07/29, 48 y.o.   MRN: [id number]  PCP: Dr. Berkley Harvey [facility]) EP: Dr Caryl Comes   HPI: [name] is a 48 yo Joseph [name] with a history of chronic systolic HF, NICM, HTN, TIA, stroke, DM2,  tobacco abuse and polysubstance abuse.   Has been admitted 2 times in the past month for SOB and right sided weakness and slurred speech. Discharged 07/06/13 and last weight on chart 180 lbs.   R/LHC 06/30/13: RA: 6  RV: 26/9  PA: 26/13  PC: a 18 mean 13 CO/CI Fick: 3.7/2.0 1) Mild CAD with 30% narrowing in mid AV groove circumflex, and 30% proximal and 40% mid RCA stenosis with otherwise normal LAD and ramus  EF: 20%, mild MR, no embolus (06/2013) MRI head: acute infraction with > 3 cm infarct involving L frontal opercular cortex and subcortical white matter (07/2013) ECHO (10/13/13) EF 20-25% (10/2013)  Follow up for Heart Failure: Since last visit underwent ICD implant on 12/01/13. Doing well. Denies SOB, orthopnea, PND or CP. Reports soreness over ICD implant area. Walking about 30 minutes daily. Weight at home 188 lbs.   Labs: (07/21/13) K 4.3, creatinine 1.03 (08/27/13 ) K 4.5 Creatinine 0.91  11/24/13: K 4.4 Creatinine 1.1   SH: No smoking, no ETOH and no drugs as of 06/28/13. Not working. Applied for disability. Has orange card. Medicaid pending.   FH: Mother: DM2, living        Father deceased, no issues  ROS: All systems negative except as listed in HPI, PMH and Problem List.  PMH:  1) HTN 2) NICM: LHC (06/2013): Mild coronary obstructive disease with 30% narrowing in the mid AV groove circumflex, and 30% proximal and 40% mid RCA stenoses with otherwise normal LAD and ramus intermediate vessel 3) Chronic systolic HF: RHC (08/2945) RA: 6, RV: 26/9, PA: 26/13, PC: a 18 mean 13, CO/CI Fick: 3.7/2.0, left ventriculography EF 15%; ECHO (06/2013) EF 20%, diffuse HK, mild MR; TEE (07/2013) no thrombus; ECHO (10/2013): EF 20-25%, diff HK, LA mod dilated; [employer] ICD implanted (12/01/13) 4) DM2 5) Stroke: MRI (07/2013) revealed an acute infarction, with a greater than 3 cm infarct involving left frontal opercular cortex and subcortical white matter. Carotid dopplers (07/2013) mild soft plaque origin ICA. 1-39% ICA stenosis. Vertebral artery flow is antegrade 6) Polysubstance abuse: + cocaine UDS (last used April)    Current Outpatient Prescriptions  Medication Sig Dispense Refill  . aspirin 81 MG EC tablet Take 1 tablet (81 mg total) by mouth daily.  30 tablet  3  . atorvastatin (LIPITOR) 80 MG tablet Take 1 tablet (80 mg total) by mouth daily at 6 PM.  30 tablet  3  . Blood Glucose Monitoring Suppl ([place]) DEVI Patient will need to test his blood glucose once daily prior to breakfast and metformin and amaryl administration.  1 Device  0  . carvedilol (COREG) 12.5 MG tablet Take 1.5 tablets (18.75 mg total) by mouth 2 (two) times daily with a meal.  90 tablet  3  . furosemide (LASIX) 40 MG tablet Take 1 tablet (40 mg total) by mouth daily.  30 tablet  3  . glucose blood test strip Use as instructed  100 each  12  . metFORMIN (GLUCOPHAGE) 500 MG tablet Take 2 tablets (1,000 mg total) by mouth 2 (two) times daily with a meal.  120 tablet  2  . ReliOn Ultra Thin Lancets MISC Patient will  need to test his blood glucose once daily prior to breakfast and metformin and amaryl administration.  100 each  2  . Sacubitril-Valsartan (ENTRESTO) 24-26 MG TABS Take 1 tablet by mouth 2 (two) times daily.      Marland Kitchen spironolactone (ALDACTONE) 25 MG tablet Take 0.5 tablets (12.5 mg total) by mouth daily.  15 tablet  3   No current facility-administered medications for this encounter.     Filed Vitals:   12/09/13 1505  BP: 137/91  Pulse: 86  Resp: 18  Weight: 188 lb 2 oz (85.333 kg)  SpO2: 98%    PHYSICAL EXAM: General:  Well appearing. No resp difficulty. Girlfriend present  HEENT: normal Neck: supple. JVP flat. Carotids 2+ bilaterally;  no bruits. No lymphadenopathy or thryomegaly appreciated. Cor: PMI normal. Regular rate & rhythm. No rubs, gallops or murmurs. Life Vest On  Lungs: clear Abdomen: soft, nontender, nondistended. No hepatosplenomegaly. No bruits or masses. Good bowel sounds. Extremities: no cyanosis, clubbing, rash, edema Neuro: alert & orientedx3, cranial nerves grossly intact. Moves all 4 extremities w/o difficulty. Affect pleasant.  ASSESSMENT & PLAN:  1) Chronic systolic HF: NICM s/p ICD, EF 20-25% (10/2013) - NYHA II symptoms and volume status stable. Will continue lasix 40 mg daily. Discussed the use of sliding scale of diuretics.  - Will increase coreg to 25 mg BID. Told to call if any dizziness. - Continue Entresto 24-26 BID and provided him with more samples. Waiting to see if he will be approved for the assistance program. Instructed patient to call  - He has been on Entresto in 2 weeks.  Will contact [employer] today to find out where he is in the process for low income patients.  Instructed patient to call when he is getting close to running out to make sure we get him more samples.  - Continue spiro 12.5 mg daily.  - Reinforced the need and importance of daily weights, a low sodium diet, and fluid restriction (less than 2 L a day). Instructed to call the HF clinic if weight increases more than 3 lbs overnight or 5 lbs in a week.  2) Non-obstructive CAD - No s/s of ischemia. Continue statin, ASA, ACE-I and BB.  3) HTN - Controlled. As above increase BB for LV dysfunction.  4) ?OSA - Sleep Study once Medicaid in place. Currently has orange card.  5) Polysubstance abuse - He remains drug free and congratulated patient on success.  6) Stroke (07/2013) - acute infraction with > 3 cm infarct involving L frontal opercular cortex and subcortical white matter. Continue statin and ASA 7) DM2 - Followed up with PCP. Hgb A1C much improved.   Follow up in 1 month [name] BNP-C 3:11  PM

## 2013-12-09 NOTE — Patient Instructions (Signed)
Doing great.  Increase your coreg to 25 mg (2 tablets) in the morning and 25 mg (2 tablets) in the evening. When you get your new prescription it will be for 25 mg tablets and then you will take 25 mg (1 tablet) in the morning and 25 mg (1 tablet) in the evening.   Follow up in 1 month  Do the following things EVERYDAY: 1) Weigh yourself in the morning before breakfast. Write it down and keep it in a log. 2) Take your medicines as prescribed 3) Eat low salt foods-Limit salt (sodium) to 2000 mg per day.  4) Stay as active as you can everyday 5) Limit all fluids for the day to less than 2 liters 6)

## 2013-12-13 ENCOUNTER — Encounter (HOSPITAL_COMMUNITY): Payer: No Typology Code available for payment source

## 2013-12-13 ENCOUNTER — Encounter: Payer: Self-pay | Admitting: Internal Medicine

## 2013-12-13 ENCOUNTER — Ambulatory Visit (INDEPENDENT_AMBULATORY_CARE_PROVIDER_SITE_OTHER): Payer: No Typology Code available for payment source | Admitting: *Deleted

## 2013-12-13 DIAGNOSIS — I5043 Acute on chronic combined systolic (congestive) and diastolic (congestive) heart failure: Secondary | ICD-10-CM

## 2013-12-13 LAB — MDC_IDC_ENUM_SESS_TYPE_INCLINIC
Date Time Interrogation Session: 20151012040000
HIGH POWER IMPEDANCE MEASURED VALUE: 65 Ohm
Lead Channel Pacing Threshold Amplitude: 0.6 V
Lead Channel Sensing Intrinsic Amplitude: 18.3 mV
Lead Channel Setting Pacing Amplitude: 3.5 V
Lead Channel Setting Sensing Sensitivity: 0.6 mV
MDC IDC MSMT LEADCHNL RV IMPEDANCE VALUE: 423 Ohm
MDC IDC MSMT LEADCHNL RV PACING THRESHOLD PULSEWIDTH: 0.4 ms
MDC IDC PG SERIAL: 191604
MDC IDC SET LEADCHNL RV PACING PULSEWIDTH: 0.4 ms
Zone Setting Detection Interval: 250 ms
Zone Setting Detection Interval: 300 ms

## 2013-12-13 NOTE — Progress Notes (Signed)
Wound check appointment.  Wound without redness or edema. Incision edges approximated, wound well healed. Normal device function. Thresholds, sensing, and impedances consistent with implant measurements. Device programmed at 3.5V for extra safety margin until 3 month visit. Histogram distribution appropriate for patient and level of activity. No ventricular arrhythmias noted. Patient educated about wound care, arm mobility, lifting restrictions, shock plan. ROV in 3 months with implanting physician.

## 2013-12-14 ENCOUNTER — Other Ambulatory Visit (HOSPITAL_COMMUNITY): Payer: Self-pay | Admitting: Anesthesiology

## 2014-01-03 ENCOUNTER — Inpatient Hospital Stay (HOSPITAL_COMMUNITY): Admission: RE | Admit: 2014-01-03 | Payer: Self-pay | Source: Ambulatory Visit

## 2014-01-06 ENCOUNTER — Ambulatory Visit (HOSPITAL_COMMUNITY)
Admission: RE | Admit: 2014-01-06 | Discharge: 2014-01-06 | Disposition: A | Discharge status: Home / Self Care | Payer: Medicaid Other | Source: Ambulatory Visit | Attending: Internal Medicine | Admitting: Internal Medicine

## 2014-01-06 ENCOUNTER — Encounter (HOSPITAL_COMMUNITY): Payer: Self-pay

## 2014-01-06 VITALS — BP 131/80 | HR 90 | Resp 18 | Wt 192.0 lb

## 2014-01-06 DIAGNOSIS — I5022 Chronic systolic (congestive) heart failure: Secondary | ICD-10-CM | POA: Insufficient documentation

## 2014-01-06 DIAGNOSIS — I429 Cardiomyopathy, unspecified: Secondary | ICD-10-CM | POA: Diagnosis not present

## 2014-01-06 DIAGNOSIS — Z95810 Presence of automatic (implantable) cardiac defibrillator: Secondary | ICD-10-CM | POA: Insufficient documentation

## 2014-01-06 DIAGNOSIS — Z720 Tobacco use: Secondary | ICD-10-CM | POA: Diagnosis not present

## 2014-01-06 DIAGNOSIS — Z7982 Long term (current) use of aspirin: Secondary | ICD-10-CM | POA: Insufficient documentation

## 2014-01-06 DIAGNOSIS — F1910 Other psychoactive substance abuse, uncomplicated: Secondary | ICD-10-CM | POA: Diagnosis not present

## 2014-01-06 DIAGNOSIS — I10 Essential (primary) hypertension: Secondary | ICD-10-CM | POA: Insufficient documentation

## 2014-01-06 DIAGNOSIS — Z8673 Personal history of transient ischemic attack (TIA), and cerebral infarction without residual deficits: Secondary | ICD-10-CM | POA: Diagnosis not present

## 2014-01-06 DIAGNOSIS — I639 Cerebral infarction, unspecified: Secondary | ICD-10-CM

## 2014-01-06 DIAGNOSIS — I2510 Atherosclerotic heart disease of native coronary artery without angina pectoris: Secondary | ICD-10-CM | POA: Diagnosis not present

## 2014-01-06 DIAGNOSIS — E119 Type 2 diabetes mellitus without complications: Secondary | ICD-10-CM | POA: Insufficient documentation

## 2014-01-06 LAB — BASIC METABOLIC PANEL
Anion gap: 14 (ref 5–15)
BUN: 12 mg/dL (ref 6–23)
CALCIUM: 9.2 mg/dL (ref 8.4–10.5)
CO2: 23 mEq/L (ref 19–32)
Chloride: 103 mEq/L (ref 96–112)
Creatinine, Ser: 0.92 mg/dL (ref 0.50–1.35)
Glucose, Bld: 189 mg/dL — ABNORMAL HIGH (ref 70–99)
Potassium: 4.4 mEq/L (ref 3.7–5.3)
Sodium: 140 mEq/L (ref 137–147)

## 2014-01-06 MED ORDER — SACUBITRIL-VALSARTAN 49-51 MG PO TABS: 1.0000 | ORAL_TABLET | Freq: Two times a day (BID) | ORAL | Status: DC

## 2014-01-06 NOTE — Progress Notes (Addendum)
Patient ID: Joseph [name], male   DOB: [birth year], 48 y.o.   MRN: [id number]  PCP: Dr. Berkley Harvey [facility]) EP: Dr Caryl Comes   HPI: Mr. Bord is a 48 yo male with a history of chronic systolic HF, NICM, HTN, TIA, stroke, DM2,  tobacco abuse and polysubstance abuse.   Has been admitted 2 times in the past month for SOB and right sided weakness and slurred speech. Discharged 07/06/13 and last weight on chart 180 lbs.   R/LHC 06/30/13: RA: 6  RV: 26/9  PA: 26/13  PC: a 18 mean 13 CO/CI Fick: 3.7/2.0 1) Mild CAD with 30% narrowing in mid AV groove circumflex, and 30% proximal and 40% mid RCA stenosis with otherwise normal LAD and ramus  EF: 20%, mild MR, no embolus (06/2013) MRI head: acute infraction with > 3 cm infarct involving L frontal opercular cortex and subcortical white matter (07/2013) ECHO (10/13/13) EF 20-25% (10/2013)  Follow up for Heart Failure: Last visit increased cored to 25 mg BID which he tolerated. Doing great. Denies SOB, orthopnea, CP or edema. Walking about 30 min daily with no issues. Weight at home 184-186 lbs. Taking medications as prescribed.    Labs: (07/21/13) K 4.3, creatinine 1.03 (08/27/13 ) K 4.5 Creatinine 0.91  (11/24/13): K 4.4 Creatinine 1.1  (12/09/13): K 4.1, creatinine 0.8   SH: No smoking, no ETOH and no drugs as of 06/28/13. Not working. Applied for disability. Has orange card. Medicaid pending.   FH: Mother: DM2, living        Father deceased, no issues  ROS: All systems negative except as listed in HPI, PMH and Problem List.  PMH:  1) HTN 2) NICM: LHC (06/2013): Mild coronary obstructive disease with 30% narrowing in the mid AV groove circumflex, and 30% proximal and 40% mid RCA stenoses with otherwise normal LAD and ramus intermediate vessel 3) Chronic systolic HF: RHC (09/6224) RA: 6, RV: 26/9, PA: 26/13, PC: a 18 mean 13, CO/CI Fick: 3.7/2.0, left ventriculography EF 15%; ECHO (06/2013) EF 20%, diffuse HK, mild MR; TEE (07/2013) no thrombus;  ECHO (10/2013): EF 20-25%, diff HK, LA mod dilated; [employer] ICD implanted (12/01/13) 4) DM2 5) Stroke: MRI (07/2013) revealed an acute infarction, with a greater than 3 cm infarct involving left frontal opercular cortex and subcortical white matter. Carotid dopplers (07/2013) mild soft plaque origin ICA. 1-39% ICA stenosis. Vertebral artery flow is antegrade 6) Polysubstance abuse: + cocaine UDS (last used April)    Current Outpatient Prescriptions  Medication Sig Dispense Refill  . aspirin 81 MG EC tablet Take 1 tablet (81 mg total) by mouth daily. 30 tablet 3  . atorvastatin (LIPITOR) 80 MG tablet Take 1 tablet (80 mg total) by mouth daily at 6 PM. 30 tablet 3  . Blood Glucose Monitoring Suppl ([place]) DEVI Patient will need to test his blood glucose once daily prior to breakfast and metformin and amaryl administration. 1 Device 0  . carvedilol (COREG) 25 MG tablet Take 1 tablet (25 mg total) by mouth 2 (two) times daily with a meal. 60 tablet 3  . furosemide (LASIX) 40 MG tablet TAKE 1 TABLET BY MOUTH DAILY 30 tablet 3  . glucose blood test strip Use as instructed 100 each 12  . metFORMIN (GLUCOPHAGE) 500 MG tablet Take 2 tablets (1,000 mg total) by mouth 2 (two) times daily with a meal. 120 tablet 2  . ReliOn Ultra Thin Lancets MISC Patient will need to test his blood glucose  once daily prior to breakfast and metformin and amaryl administration. 100 each 2  . Sacubitril-Valsartan (ENTRESTO) 24-26 MG TABS Take 1 tablet by mouth 2 (two) times daily.    Marland Kitchen spironolactone (ALDACTONE) 25 MG tablet TAKE 1/2 TABLET BY MOUTH DAILY. 15 tablet 3   No current facility-administered medications for this encounter.     Filed Vitals:   01/06/14 1103  BP: 131/80  Pulse: 90  Resp: 18  Weight: 192 lb (87.091 kg)  SpO2: 98%    PHYSICAL EXAM: General:  Well appearing. No resp difficulty. Girlfriend present  HEENT: normal Neck: supple. JVP flat. Carotids 2+ bilaterally; no  bruits. No lymphadenopathy or thryomegaly appreciated. Cor: PMI normal. Regular rate & rhythm. No rubs, gallops or murmurs. Lungs: clear Abdomen: soft, nontender, nondistended. No hepatosplenomegaly. No bruits or masses. Good bowel sounds. Extremities: no cyanosis, clubbing, rash, edema Neuro: alert & orientedx3, cranial nerves grossly intact. Moves all 4 extremities w/o difficulty. Affect pleasant.  ASSESSMENT & PLAN:  1) Chronic systolic HF: NICM s/p ICD, EF 20-25% (10/2013) - NYHA I-II symptoms and volume status stable. Will continue lasix 40 mg daily. Discussed the use of sliding scale of diuretics.  - On goal dose coreg 25 mg BID. - Increase Entresto to 49-51 mg BID. Will provide samples and pending approval. Check BMET today and in 7-10 days.  - Continue spiro 12.5 mg daily.  - Reinforced the need and importance of daily weights, a low sodium diet, and fluid restriction (less than 2 L a day). Instructed to call the HF clinic if weight increases more than 3 lbs overnight or 5 lbs in a week.  2) Non-obstructive CAD - No s/s of ischemia. Continue statin, ASA and BB.  3) HTN -  Mildly elevated. As above increase Entresto. 4) ?OSA - Sleep Study once Medicaid in place. Currently has orange card, still pending Medicaid.   5) Polysubstance abuse - He remains drug free and congratulated patient on success.  6) Stroke (07/2013) - acute infraction with > 3 cm infarct involving L frontal opercular cortex and subcortical white matter. Continue statin and ASA   F/U 6 weeks [name] BNP-C 11:12 AM

## 2014-01-06 NOTE — Patient Instructions (Signed)
Doing great.  Finish out current [employer]. When you run out then start new bottle 49-51 (1 tablet) twice a day.  Call any issues.  Labs in 2 weeks.  Follow up in 6 weeks.  Call when getting low on medication.  Do the following things EVERYDAY: 1) Weigh yourself in the morning before breakfast. Write it down and keep it in a log. 2) Take your medicines as prescribed 3) Eat low salt foods-Limit salt (sodium) to 2000 mg per day.  4) Stay as active as you can everyday 5) Limit all fluids for the day to less than 2 liters

## 2014-01-18 ENCOUNTER — Ambulatory Visit (HOSPITAL_COMMUNITY)
Admission: RE | Admit: 2014-01-18 | Discharge: 2014-01-18 | Disposition: A | Discharge status: Home / Self Care | Payer: Medicaid Other | Source: Ambulatory Visit | Attending: Cardiology | Admitting: Cardiology

## 2014-01-18 DIAGNOSIS — I5022 Chronic systolic (congestive) heart failure: Secondary | ICD-10-CM | POA: Insufficient documentation

## 2014-01-18 LAB — BASIC METABOLIC PANEL
Anion gap: 13 (ref 5–15)
BUN: 17 mg/dL (ref 6–23)
CO2: 27 mEq/L (ref 19–32)
Calcium: 9.8 mg/dL (ref 8.4–10.5)
Chloride: 98 mEq/L (ref 96–112)
Creatinine, Ser: 1.01 mg/dL (ref 0.50–1.35)
GFR, EST NON AFRICAN AMERICAN: 85 mL/min — AB (ref >90)
Glucose, Bld: 294 mg/dL — ABNORMAL HIGH (ref 70–99)
Potassium: 4.6 mEq/L (ref 3.7–5.3)
Sodium: 138 mEq/L (ref 137–147)

## 2014-01-24 ENCOUNTER — Telehealth (HOSPITAL_COMMUNITY): Payer: Self-pay | Admitting: Vascular Surgery

## 2014-01-24 NOTE — Telephone Encounter (Signed)
Records were sent out today, Joseph [name] is aware

## 2014-01-24 NOTE — Telephone Encounter (Signed)
Joseph [name] from Disability called.. She states she sent record requests forms 12/22/13 and 01/13/14 .Marland Kitchen She need the records to complete his claim.Marland Kitchen Please advise ext [phone]

## 2014-02-03 ENCOUNTER — Telehealth (HOSPITAL_COMMUNITY): Payer: Self-pay | Admitting: *Deleted

## 2014-02-03 NOTE — Telephone Encounter (Signed)
Pt has been approved for the [employer] Pt assistance program to receive Entresto for free, he is approved 02/01/14-02/02/15 and should receive his 1st shipment in the mail by the end of next week, pt is aware and agreeable

## 2014-02-10 ENCOUNTER — Encounter (HOSPITAL_COMMUNITY): Payer: Self-pay | Admitting: Cardiovascular Disease

## 2014-02-17 ENCOUNTER — Ambulatory Visit (HOSPITAL_COMMUNITY)
Admission: RE | Admit: 2014-02-17 | Discharge: 2014-02-17 | Disposition: A | Discharge status: Home / Self Care | Payer: Medicaid Other | Source: Ambulatory Visit | Attending: Internal Medicine | Admitting: Internal Medicine

## 2014-02-17 ENCOUNTER — Encounter (HOSPITAL_COMMUNITY): Payer: Self-pay

## 2014-02-17 VITALS — BP 148/88 | HR 97 | Resp 18 | Wt 183.2 lb

## 2014-02-17 DIAGNOSIS — I10 Essential (primary) hypertension: Secondary | ICD-10-CM | POA: Insufficient documentation

## 2014-02-17 DIAGNOSIS — R0683 Snoring: Secondary | ICD-10-CM

## 2014-02-17 DIAGNOSIS — I2510 Atherosclerotic heart disease of native coronary artery without angina pectoris: Secondary | ICD-10-CM | POA: Insufficient documentation

## 2014-02-17 DIAGNOSIS — Z8673 Personal history of transient ischemic attack (TIA), and cerebral infarction without residual deficits: Secondary | ICD-10-CM | POA: Insufficient documentation

## 2014-02-17 DIAGNOSIS — Z7982 Long term (current) use of aspirin: Secondary | ICD-10-CM | POA: Insufficient documentation

## 2014-02-17 DIAGNOSIS — E119 Type 2 diabetes mellitus without complications: Secondary | ICD-10-CM | POA: Insufficient documentation

## 2014-02-17 DIAGNOSIS — F1910 Other psychoactive substance abuse, uncomplicated: Secondary | ICD-10-CM | POA: Insufficient documentation

## 2014-02-17 DIAGNOSIS — I5022 Chronic systolic (congestive) heart failure: Secondary | ICD-10-CM

## 2014-02-17 DIAGNOSIS — F17200 Nicotine dependence, unspecified, uncomplicated: Secondary | ICD-10-CM | POA: Insufficient documentation

## 2014-02-17 DIAGNOSIS — Z79899 Other long term (current) drug therapy: Secondary | ICD-10-CM | POA: Insufficient documentation

## 2014-02-17 DIAGNOSIS — G4733 Obstructive sleep apnea (adult) (pediatric): Secondary | ICD-10-CM | POA: Insufficient documentation

## 2014-02-17 HISTORY — DX: Snoring: R06.83

## 2014-02-17 LAB — BASIC METABOLIC PANEL
Anion gap: 15 (ref 5–15)
BUN: 20 mg/dL (ref 6–23)
CO2: 21 mEq/L (ref 19–32)
CREATININE: 0.81 mg/dL (ref 0.50–1.35)
Calcium: 9.1 mg/dL (ref 8.4–10.5)
Chloride: 98 mEq/L (ref 96–112)
GFR calc non Af Amer: >90 mL/min (ref >90)
GLUCOSE: 300 mg/dL — AB (ref 70–99)
Potassium: 4.3 mEq/L (ref 3.7–5.3)
Sodium: 134 mEq/L — ABNORMAL LOW (ref 137–147)

## 2014-02-17 MED ORDER — SACUBITRIL-VALSARTAN 49-51 MG PO TABS: 1.0000 | ORAL_TABLET | Freq: Two times a day (BID) | ORAL | Status: DC

## 2014-02-17 MED ORDER — SPIRONOLACTONE 25 MG PO TABS: 25.0000 mg | ORAL_TABLET | Freq: Every day | ORAL | Status: DC

## 2014-02-17 MED ORDER — FUROSEMIDE 40 MG PO TABS
40.0000 mg | ORAL_TABLET | Freq: Every day | ORAL | Status: DC
Start: 2014-02-17 — End: 2014-03-15

## 2014-02-17 NOTE — Progress Notes (Addendum)
Patient ID: Joseph [name], male   DOB: 09/21/1965, 48 y.o.   MRN: [id number]  PCP: Dr. Berkley Harvey [facility]) EP: Dr Caryl Comes   HPI: Mr. Pavey is a 48 yo male with a history of chronic systolic HF, NICM, HTN, TIA, stroke, DM2,  tobacco abuse and polysubstance abuse.   Has been admitted 2 times in the past month for SOB and right sided weakness and slurred speech. Discharged 07/06/13 and last weight on chart 180 lbs.   R/LHC 06/30/13: RA: 6  RV: 26/9  PA: 26/13  PC: a 18 mean 13 CO/CI Fick: 3.7/2.0 1) Mild CAD with 30% narrowing in mid AV groove circumflex, and 30% proximal and 40% mid RCA stenosis with otherwise normal LAD and ramus  EF: 20%, mild MR, no embolus (06/2013) MRI head: acute infraction with > 3 cm infarct involving L frontal opercular cortex and subcortical white matter (07/2013) ECHO (10/13/13) EF 20-25% (10/2013)  Follow up for Heart Failure: Last visit increased Entresto to 49/51 BID. He has not received the new dose yet so has been doubling his 24/26 tablets which he has tolerated. Doing well. Reports he did have mild dizziness one day. Denies SOB, orthopnea, PND or CP. Walking about 30 min a day with no issues. Weight at home 184 lbs. Following a low salt diet and drinking less than 2L a day. Taking medications as prescribed.  Labs: (07/21/13) K 4.3, creatinine 1.03 (08/27/13 ) K 4.5 Creatinine 0.91  (11/24/13): K 4.4 Creatinine 1.1  (12/09/13): K 4.1, creatinine 0.8 (01/18/14) K 4.6, creatinine 1.01   SH: No smoking, no ETOH and no drugs as of 06/28/13. Not working. Applied for disability. Has orange card. Medicaid pending.   FH: Mother: DM2, living        Father deceased, no issues  ROS: All systems negative except as listed in HPI, PMH and Problem List.  PMH:  1) HTN 2) NICM: LHC (06/2013): Mild coronary obstructive disease with 30% narrowing in the mid AV groove circumflex, and 30% proximal and 40% mid RCA stenoses with otherwise normal LAD and ramus intermediate  vessel 3) Chronic systolic HF: RHC (09/7410) RA: 6, RV: 26/9, PA: 26/13, PC: a 18 mean 13, CO/CI Fick: 3.7/2.0, left ventriculography EF 15%; ECHO (06/2013) EF 20%, diffuse HK, mild MR; TEE (07/2013) no thrombus; ECHO (10/2013): EF 20-25%, diff HK, LA mod dilated; [employer] ICD implanted (12/01/13) 4) DM2 5) Stroke: MRI (07/2013) revealed an acute infarction, with a greater than 3 cm infarct involving left frontal opercular cortex and subcortical white matter. Carotid dopplers (07/2013) mild soft plaque origin ICA. 1-39% ICA stenosis. Vertebral artery flow is antegrade 6) Polysubstance abuse: + cocaine UDS (last used April)    Current Outpatient Prescriptions  Medication Sig Dispense Refill  . aspirin 81 MG EC tablet Take 1 tablet (81 mg total) by mouth daily. 30 tablet 3  . Blood Glucose Monitoring Suppl ([place]) DEVI Patient will need to test his blood glucose once daily prior to breakfast and metformin and amaryl administration. 1 Device 0  . carvedilol (COREG) 25 MG tablet Take 1 tablet (25 mg total) by mouth 2 (two) times daily with a meal. 60 tablet 3  . furosemide (LASIX) 40 MG tablet TAKE 1 TABLET BY MOUTH DAILY 30 tablet 3  . glucose blood test strip Use as instructed 100 each 12  . metFORMIN (GLUCOPHAGE) 500 MG tablet Take 2 tablets (1,000 mg total) by mouth 2 (two) times daily with a meal. 120  tablet 2  . ReliOn Ultra Thin Lancets MISC Patient will need to test his blood glucose once daily prior to breakfast and metformin and amaryl administration. 100 each 2  . sacubitril-valsartan (ENTRESTO) 49-51 MG Take 1 tablet by mouth 2 (two) times daily. 60 tablet 3  . spironolactone (ALDACTONE) 25 MG tablet TAKE 1/2 TABLET BY MOUTH DAILY. 15 tablet 3   No current facility-administered medications for this encounter.     Filed Vitals:   02/17/14 0958  BP: 148/88  Pulse: 97  Resp: 18  Weight: 183 lb 4 oz (83.122 kg)  SpO2: 100%    PHYSICAL EXAM: General:  Well  appearing. No resp difficulty. Girlfriend present  HEENT: normal Neck: supple. JVP flat. Carotids 2+ bilaterally; no bruits. No lymphadenopathy or thryomegaly appreciated. Cor: PMI normal. Regular rate & rhythm. No rubs, gallops or murmurs. Lungs: clear Abdomen: soft, nontender, nondistended. No hepatosplenomegaly. No bruits or masses. Good bowel sounds. Extremities: no cyanosis, clubbing, rash, edema Neuro: alert & orientedx3, cranial nerves grossly intact. Moves all 4 extremities w/o difficulty. Affect pleasant.  ASSESSMENT & PLAN:  1) Chronic systolic HF: NICM s/p ICD, EF 20-25% (10/2013) - NYHA I-II symptoms and volume status stable. Will continue lasix 40 mg daily. Discussed the use of sliding scale of diuretics.  - On goal dose coreg 25 mg BID. - Continue Entresto to 49-51 mg BID. Will check BMET today. Have called company with new Rx to send patient new dosage. - Increase Spironolactone to 25 mg daily. Check BMET in 7-10 days.  - Repeat Echo next visit.  - Reinforced the need and importance of daily weights, a low sodium diet, and fluid restriction (less than 2 L a day). Instructed to call the HF clinic if weight increases more than 3 lbs overnight or 5 lbs in a week.  2) Non-obstructive CAD - No s/s of ischemia. Continue statin, ASA and BB.  3) HTN -  Mildly elevated. Increase Spironolactone to 25 mg daily.  4) ?OSA - Sleep Study once Medicaid in place. Currently has orange card, still pending Medicaid.   5) Polysubstance abuse - Has been drug free since 06/2013. Keep up the good work.  6) Stroke (07/2013) - acute infraction with > 3 cm infarct involving L frontal opercular cortex and subcortical white matter. Continue statin and ASA  Labs 7-10 days and follow up 2 months with Echo [name] B NP-C 10:10 AM  Addendum: [place] and placed order for new Rx. They are looking into whether he will be approved for [facility] 49/51

## 2014-02-17 NOTE — Patient Instructions (Signed)
Doing great.  Have a wonderful Christmas and New Years.  Increase your Spironolactone to 25 mg daily.  Come get labs in 7-10 days on a Tuesday.  Call any issues.  Follow up in 2 months with Echo  Do the following things EVERYDAY: 1) Weigh yourself in the morning before breakfast. Write it down and keep it in a log. 2) Take your medicines as prescribed 3) Eat low salt foods-Limit salt (sodium) to 2000 mg per day.  4) Stay as active as you can everyday 5) Limit all fluids for the day to less than 2 liters 6)

## 2014-03-01 ENCOUNTER — Other Ambulatory Visit (HOSPITAL_COMMUNITY): Payer: Self-pay

## 2014-03-08 ENCOUNTER — Encounter: Payer: Self-pay | Admitting: Internal Medicine

## 2014-03-15 ENCOUNTER — Ambulatory Visit (HOSPITAL_COMMUNITY)
Admission: RE | Admit: 2014-03-15 | Discharge: 2014-03-15 | Disposition: A | Discharge status: Home / Self Care | Payer: Medicaid Other | Source: Ambulatory Visit | Attending: Cardiology | Admitting: Cardiology

## 2014-03-15 ENCOUNTER — Encounter: Payer: Self-pay | Admitting: Internal Medicine

## 2014-03-15 ENCOUNTER — Ambulatory Visit (INDEPENDENT_AMBULATORY_CARE_PROVIDER_SITE_OTHER): Payer: Medicaid Other | Admitting: Internal Medicine

## 2014-03-15 VITALS — BP 86/60 | HR 102 | Ht 65.0 in | Wt 182.2 lb

## 2014-03-15 DIAGNOSIS — I5043 Acute on chronic combined systolic (congestive) and diastolic (congestive) heart failure: Secondary | ICD-10-CM

## 2014-03-15 DIAGNOSIS — I10 Essential (primary) hypertension: Secondary | ICD-10-CM

## 2014-03-15 DIAGNOSIS — I5022 Chronic systolic (congestive) heart failure: Secondary | ICD-10-CM | POA: Insufficient documentation

## 2014-03-15 DIAGNOSIS — I429 Cardiomyopathy, unspecified: Secondary | ICD-10-CM

## 2014-03-15 DIAGNOSIS — I428 Other cardiomyopathies: Secondary | ICD-10-CM

## 2014-03-15 DIAGNOSIS — I480 Paroxysmal atrial fibrillation: Secondary | ICD-10-CM

## 2014-03-15 LAB — MDC_IDC_ENUM_SESS_TYPE_INCLINIC
Battery Remaining Longevity: 12
Date Time Interrogation Session: 20160112050000
HighPow Impedance: 83 Ohm
Lead Channel Pacing Threshold Amplitude: 0.6 V
Lead Channel Pacing Threshold Pulse Width: 0.4 ms
Lead Channel Setting Sensing Sensitivity: 0.6 mV
MDC IDC MSMT LEADCHNL RV IMPEDANCE VALUE: 468 Ohm
MDC IDC MSMT LEADCHNL RV SENSING INTR AMPL: 20.2 mV
MDC IDC PG SERIAL: 191604
MDC IDC SET LEADCHNL RV PACING AMPLITUDE: 2.4 V
MDC IDC SET LEADCHNL RV PACING PULSEWIDTH: 0.4 ms
MDC IDC STAT BRADY RV PERCENT PACED: 1 % — AB
Zone Setting Detection Interval: 250 ms
Zone Setting Detection Interval: 300 ms

## 2014-03-15 LAB — BASIC METABOLIC PANEL
ANION GAP: 12 (ref 5–15)
BUN: 19 mg/dL (ref 6–23)
CALCIUM: 9.3 mg/dL (ref 8.4–10.5)
CO2: 20 mmol/L (ref 19–32)
Chloride: 98 mEq/L (ref 96–112)
Creatinine, Ser: 1.33 mg/dL (ref 0.50–1.35)
GFR calc Af Amer: 71 mL/min — ABNORMAL LOW (ref >90)
GFR, EST NON AFRICAN AMERICAN: 61 mL/min — AB (ref >90)
Glucose, Bld: 501 mg/dL — ABNORMAL HIGH (ref 70–99)
POTASSIUM: 4.9 mmol/L (ref 3.5–5.1)
Sodium: 130 mmol/L — ABNORMAL LOW (ref 135–145)

## 2014-03-15 MED ORDER — ATORVASTATIN CALCIUM 80 MG PO TABS: 80.0000 mg | ORAL_TABLET | Freq: Every day | ORAL | Status: DC

## 2014-03-15 MED ORDER — APIXABAN 5 MG PO TABS: 5.0000 mg | ORAL_TABLET | Freq: Two times a day (BID) | ORAL | Status: DC

## 2014-03-15 MED ORDER — SPIRONOLACTONE 25 MG PO TABS: 25.0000 mg | ORAL_TABLET | Freq: Every day | ORAL | Status: DC

## 2014-03-15 NOTE — Patient Instructions (Addendum)
Your physician has recommended you make the following change in your medication:  1) Stop aspirin 2) Decrease lasix (furosemide) to 40 mg one tablet every other day 3) Decrease spironolactone (aldactone) to 25 mg 1/2 tablet (12.5 mg) by mouth daily 4) Start Eliquis 5 mg one tablet by mouth twice daily  Your physician recommends that you have lab work today: CBC  Remote monitoring is used to monitor your ICD from home. This monitoring reduces the number of office visits required to check your device to one time per year. It allows [place] to keep an eye on the functioning of your device to ensure it is working properly. You are scheduled for a device check from home on 06-14-2014. You may send your transmission at any time that day. If you have a wireless device, the transmission will be sent automatically. After your physician reviews your transmission, you will receive a postcard with your next transmission date.  Your physician wants you to follow-up in: 9 months with Dr. Caryl Comes. You will receive a reminder letter in the mail two months in advance. If you don't receive a letter, please call our office to schedule the follow-up appointment.

## 2014-03-15 NOTE — Progress Notes (Signed)
Patient Care Team: Olam Idler, MD as PCP - General (Family Medicine)   HPI  Joseph [name] is a 49 y.o. male Seen in followup for ICD implantation in context of NICM  Has hx of CHF  He denies lightheadedness shortness of breath or peripheral edema.  Past Medical History  Diagnosis Date  . Hypertension   . Shortness of breath   . High cholesterol   . CHF (congestive heart failure)   . Pneumonia ?2004  . Type II diabetes mellitus   . CAD (coronary artery disease)     a. 06/28/13 non obst dz. managed medically  . Non-ischemic cardiomyopathy     a. 06/28/13 ECHO: EF 25% and global hypokinesis and cath wuth EF 15%   . Automatic implantable cardioverter-defibrillator in situ 12/01/2013    SINGLE CHAMBER BY DR Olin Pia  . Stroke     Past Surgical History  Procedure Laterality Date  . Finger amputation Right 1987    "reattached middle and ring fingers"  . Tee without cardioversion N/A 07/06/2013    Procedure: TRANSESOPHAGEAL ECHOCARDIOGRAM (TEE);  Surgeon: [name], MD;  Location: [place];  Service: Cardiovascular;  Laterality: N/A;  . Ep implantable device  12/01/2013    single chamber by dr Caryl Comes  . Left heart catheterization with coronary angiogram N/A 06/30/2013    Procedure: LEFT HEART CATHETERIZATION WITH CORONARY ANGIOGRAM;  Surgeon: Troy Sine, MD;  Location: [facility] CATH LAB;  Service: Cardiovascular;  Laterality: N/A;  . Implantable cardioverter defibrillator implant N/A 12/01/2013    Procedure: IMPLANTABLE CARDIOVERTER DEFIBRILLATOR IMPLANT;  Surgeon: Deboraha Sprang, MD;  Location: [facility] C[facility] LAB;  Service: Cardiovascular;  Laterality: N/A;    Current Outpatient Prescriptions  Medication Sig Dispense Refill  . aspirin 81 MG EC tablet Take 1 tablet (81 mg total) by mouth daily. 30 tablet 3  . atorvastatin (LIPITOR) 80 MG tablet Take 1 tablet (80 mg total) by mouth daily. 30 tablet 3  . Blood Glucose Monitoring Suppl ([place]) DEVI Patient  will need to test his blood glucose once daily prior to breakfast and metformin and amaryl administration. 1 Device 0  . carvedilol (COREG) 25 MG tablet Take 1 tablet (25 mg total) by mouth 2 (two) times daily with a meal. 60 tablet 3  . furosemide (LASIX) 40 MG tablet Take 1 tablet (40 mg total) by mouth daily. 30 tablet 3  . glucose blood test strip Use as instructed 100 each 12  . metFORMIN (GLUCOPHAGE) 500 MG tablet Take 2 tablets (1,000 mg total) by mouth 2 (two) times daily with a meal. 120 tablet 2  . ReliOn Ultra Thin Lancets MISC Patient will need to test his blood glucose once daily prior to breakfast and metformin and amaryl administration. 100 each 2  . sacubitril-valsartan (ENTRESTO) 49-51 MG Take 1 tablet by mouth 2 (two) times daily. 60 tablet 3  . spironolactone (ALDACTONE) 25 MG tablet Take 1 tablet (25 mg total) by mouth daily. 30 tablet 3   No current facility-administered medications for this visit.    No Known Allergies  Review of Systems negative except from HPI and PMH  Physical Exam BP 86/60 mmHg  Pulse 102  Ht 5\' 5"  (1.651 m)  Wt 182 lb 3.2 oz (82.645 kg)  BMI 30.32 kg/m2 Well developed and well nourished in no acute distress HENT normal E scleral and icterus clear Neck Supple JVP flat; carotids brisk and full Device pocket well healed;  without hematoma or erythema.  There is no tethering  Clear to ausculation Rapid Regular rate and rhythm, no murmurs gallops or rub Soft with active bowel sounds No clubbing cyanosis  Edema Alert and oriented, grossly normal motor and sensory function Skin Warm and Dry  ECG demonstrates sinus rhythm at 101 Intervals 16/10/35 Axis left at -50 Inferior wall MI   Assessment and  Plan  Nonischemic cardiomyopathy  Congestive heart failure-chronic-systolic  Hypotension  Sinus tachycardia  Atrial fibrillation  Implantable defibrillator-[employer] Jude  Prior stroke  Variety of issues are of concern today is he  presents with tachycardia and hypotension.  His blood pressure is quite anomalous but was most recently checked in December at which time his Aldactone was increased.  We will check a CBC  Given his history of prior stroke in the identification of atrial fibrillation on his defibrillator is appropriate that he be on anticoagulation. His CHADS-VASc score is greater than or equal to 5 (TIA/stroke-2, diabetes-1, cardiomyopathy-1, hypertension-1)  We discussed the use of the NOACs compared to Coumadin. We briefly reviewed the data of at least comparability in stroke prevention, bleeding and outcome. We discussed some of the new once wherein somewhat associated with decreased ischemic stroke risk, one to be taken daily, and has been shown to be comparable and bleeding risk to aspirin. It is notable that he had no symptoms with his atrial fibrillation although he has had symptomatic palpitations in the past  We also discussed bleeding associated with warfarin as well as NOACs and a wall bleeding as a complication of all these drugs intracranial bleeding is more frequently associated with warfarin then the NOACs and a GI bleeding is more commonly associated with the latter  His last renal function was normal.  I will be in touch with the heart failure clinic. I'm not quite sure why it is that he is having this sinus tachycardia although it appears to be reactive in the context of hypotension.  I wonder if you be a candidate for of ivabradine  As relates to heart failure, he is euvolemic.

## 2014-03-16 ENCOUNTER — Other Ambulatory Visit (HOSPITAL_COMMUNITY): Payer: Self-pay

## 2014-03-16 LAB — CBC WITH DIFFERENTIAL/PLATELET
Basophils Absolute: 0.1 10*3/uL (ref 0.0–0.1)
Basophils Relative: 0.4 % (ref 0.0–3.0)
Eosinophils Absolute: 0.3 10*3/uL (ref 0.0–0.7)
Eosinophils Relative: 2.1 % (ref 0.0–5.0)
HCT: 43.7 % (ref 39.0–52.0)
Hemoglobin: 14.3 g/dL (ref 13.0–17.0)
Lymphocytes Relative: 38.4 % (ref 12.0–46.0)
Lymphs Abs: 5.6 10*3/uL — ABNORMAL HIGH (ref 0.7–4.0)
MCHC: 32.7 g/dL (ref 30.0–36.0)
MCV: 97.2 fl (ref 78.0–100.0)
Monocytes Absolute: 0.6 10*3/uL (ref 0.1–1.0)
Monocytes Relative: 4.1 % (ref 3.0–12.0)
Neutro Abs: 8.1 10*3/uL — ABNORMAL HIGH (ref 1.4–7.7)
Neutrophils Relative %: 55 % (ref 43.0–77.0)
Platelets: 191 10*3/uL (ref 150.0–400.0)
RBC: 4.5 Mil/uL (ref 4.22–5.81)
RDW: 13.6 % (ref 11.5–15.5)
WBC: 14.6 10*3/uL — ABNORMAL HIGH (ref 4.0–10.5)

## 2014-03-18 ENCOUNTER — Telehealth: Payer: Self-pay | Admitting: Internal Medicine

## 2014-03-18 NOTE — Telephone Encounter (Signed)
Spoke w/ pt and he informed me that he does not have a land line phone. I informed pt that we could fill out a form and have him a cellular adapter sent to him. Pt verbalized understanding.

## 2014-03-18 NOTE — Telephone Encounter (Signed)
New Msg         Pt states he has a heart monitor but not sure if it is a transmitter?   Pt states he doesn't have the hook ups needed.    Please contact pt. In regards to this.

## 2014-03-30 ENCOUNTER — Telehealth (HOSPITAL_COMMUNITY): Payer: Self-pay | Admitting: Vascular Surgery

## 2014-03-30 ENCOUNTER — Telehealth: Payer: Self-pay | Admitting: Internal Medicine

## 2014-03-30 NOTE — Telephone Encounter (Signed)
Pt Dropped off [place] forms today while picking up medical records  Gave this form to Sherri P .

## 2014-03-30 NOTE — Telephone Encounter (Signed)
Pt calling for lab results.

## 2014-04-04 NOTE — Telephone Encounter (Signed)
Mailed completed paperwork.

## 2014-04-07 ENCOUNTER — Other Ambulatory Visit (HOSPITAL_COMMUNITY): Payer: Self-pay

## 2014-04-07 MED ORDER — SACUBITRIL-VALSARTAN 49-51 MG PO TABS: 1.0000 | ORAL_TABLET | Freq: Two times a day (BID) | ORAL | Status: DC

## 2014-04-13 ENCOUNTER — Other Ambulatory Visit: Payer: Self-pay | Admitting: *Deleted

## 2014-04-13 MED ORDER — APIXABAN 5 MG PO TABS: 5.0000 mg | ORAL_TABLET | Freq: Two times a day (BID) | ORAL | Status: DC

## 2014-04-18 ENCOUNTER — Ambulatory Visit: Payer: Self-pay | Admitting: Family Medicine

## 2014-04-21 ENCOUNTER — Ambulatory Visit: Payer: Self-pay

## 2014-04-22 ENCOUNTER — Telehealth: Payer: Self-pay | Admitting: *Deleted

## 2014-04-22 NOTE — Telephone Encounter (Signed)
Late Entry: Handwritten rx given to pt yesterday for Eliquis 90d supply with 2 refills.  [facility] requesting.  Faxed this to them. Patient given samples.

## 2014-05-06 ENCOUNTER — Other Ambulatory Visit (HOSPITAL_COMMUNITY): Payer: Self-pay | Admitting: Anesthesiology

## 2014-05-09 ENCOUNTER — Encounter: Payer: Self-pay | Admitting: Family Medicine

## 2014-05-09 ENCOUNTER — Telehealth: Payer: Self-pay | Admitting: Family Medicine

## 2014-05-09 ENCOUNTER — Ambulatory Visit (INDEPENDENT_AMBULATORY_CARE_PROVIDER_SITE_OTHER): Payer: Medicaid Other | Admitting: Family Medicine

## 2014-05-09 VITALS — BP 126/88 | HR 106 | Temp 98.6°F | Wt 173.0 lb

## 2014-05-09 DIAGNOSIS — E1165 Type 2 diabetes mellitus with hyperglycemia: Secondary | ICD-10-CM

## 2014-05-09 DIAGNOSIS — IMO0002 Reserved for concepts with insufficient information to code with codable children: Secondary | ICD-10-CM

## 2014-05-09 LAB — BASIC METABOLIC PANEL
BUN: 10 mg/dL (ref 6–23)
CO2: 25 mEq/L (ref 19–32)
Calcium: 9.6 mg/dL (ref 8.4–10.5)
Chloride: 99 mEq/L (ref 96–112)
Creat: 0.87 mg/dL (ref 0.50–1.35)
Glucose, Bld: 316 mg/dL — ABNORMAL HIGH (ref 70–99)
POTASSIUM: 4.6 meq/L (ref 3.5–5.3)
SODIUM: 133 meq/L — AB (ref 135–145)

## 2014-05-09 LAB — POCT GLYCOSYLATED HEMOGLOBIN (HGB A1C): Hemoglobin A1C: 14.7

## 2014-05-09 MED ORDER — INSULIN GLARGINE 100 UNIT/ML ~~LOC~~ SOLN: 10.0000 [IU] | Freq: Every morning | SUBCUTANEOUS | Status: DC

## 2014-05-09 MED ORDER — INSULIN SYRINGES (DISPOSABLE) U-100 0.3 ML MISC: 1.0000 | Freq: Every day | Status: AC

## 2014-05-09 NOTE — Assessment & Plan Note (Addendum)
A1c ~ 14 today - Likely due to drinking Kool-aid w/ sugar up to gallon daily. Also eating sweets often - Discussed increased risk of another MI, CVA due to poorly controlled DM - He will decrease/stop Kool-aid and sweet intake - Start Lantus 10 units daily - Incr 2 units every other day until am BGs under 200 - Check daily BGs - F/u with PCP in 3 weeks - check bmet

## 2014-05-09 NOTE — Telephone Encounter (Signed)
Pt wanted to know if it is ok to drink Wyler's light lemonade.  Since this is sugar free I informed him that it is ok in moderation.  Voiced understanding. Dariyah Garduno,CMA

## 2014-05-09 NOTE — Patient Instructions (Signed)
Your A1c is very high today mostly due to the Fries aid and sweets you are eating and drinking. If able stop drinking Kool aid and limit your sweets to 1-2 weekly.   Check your blood sugar every morning before eating  Start Lanuts 10 units every morning after checking your blood sugar and before eating. Increase the lantus dose by 2 units every other day until your morning blood sugar is under 200  Make appointment to see Dr Berkley Harvey in the next 3-4 weeks. Call me if you are unable to afford the lantus  Below is information about low blood sugar (hypoglycemia)  Low Blood Sugar Low blood sugar (hypoglycemia) means that the level of sugar in your blood is lower than it should be. Signs of low blood sugar include:  Getting sweaty.  Feeling hungry.  Feeling dizzy or weak.  Feeling sleepier than normal.  Feeling nervous.  Headaches.  Having a fast heartbeat. Low blood sugar can happen fast and can be an emergency. Your doctor can do tests to check your blood sugar level. You can have low blood sugar and not have diabetes. HOME CARE  Check your blood sugar as told by your doctor. If it is less than 70 mg/dl or as told by your doctor, take 1 of the following:  3 to 4 glucose tablets.   cup clear juice.   cup soda pop, not diet.  1 cup milk.  5 to 6 hard candies.  Recheck blood sugar after 15 minutes. Repeat until it is at the right level.  Eat a snack if it is more than 1 hour until the next meal.  Only take medicine as told by your doctor.  Do not skip meals. Eat on time.  Do not drink alcohol except with meals.  Check your blood glucose before driving.  Check your blood glucose before and after exercise.  Always carry treatment with you, such as glucose pills.  Always wear a medical alert bracelet if you have diabetes. GET HELP RIGHT AWAY IF:   Your blood glucose goes below 70 mg/dl or as told by your doctor, and you:  Are confused.  Are not able to  swallow.  Pass out (faint).  You cannot treat yourself. You may need someone to help you.  You have low blood sugar problems often.  You have problems from your medicines.  You are not feeling better after 3 to 4 days.  You have vision changes. MAKE SURE YOU:   Understand these instructions.  Will watch this condition.  Will get help right away if you are not doing well or get worse. Document Released: 05/15/2009 Document Revised: 05/13/2011 Document Reviewed: 05/15/2009 [facility] Patient Information 2015 [place], [place]. This information is not intended to replace advice given to you by your health care provider. Make sure you discuss any questions you have with your health care provider.

## 2014-05-09 NOTE — Progress Notes (Signed)
  Patient name: Joseph [name] MRN [id number]  Date of birth: 28-Feb-1966  CC & HPI:  Joseph [name] is a 49 y.o. male presenting today for DM.   DIABETES   Blood Sugar Ranges: checking on 2 x week. Mostly > 400  Symptoms of Hypoglycemia? no  Comorbid Symptoms: no Chest pain; no SOB; no Neuropathy: no Vision problems  Medication Compliance: yes   Medication Side Effects: Denies GI issues  Counseling  Diet pattern: Drinking up to a gallon of Kool aid w/ sugar daily and eating sweets often  Exercise: 30 mins walking daily  Smoking status: former    Preventative Health Care  Eye Exam: up to date   ROS: See HPI   Medical & Surgical Hx:  Reviewed  Medications & Allergies: Reviewed  Social History: Reviewed:   Objective Findings:  Vitals: BP 126/88 mmHg  Pulse 106  Temp(Src) 98.6 F (37 C) (Oral)  Wt 173 lb (78.472 kg)  Gen: NAD CV: RRR w/o m/r/g, pulses +2 b/l Resp: CTAB w/ normal respiratory effort  Assessment & Plan:   Please See Problem Focused Assessment & Plan

## 2014-05-09 NOTE — Progress Notes (Signed)
I was the preceptor for this visit. 

## 2014-05-09 NOTE — Telephone Encounter (Signed)
Pt called and had some questions about some type of drink that is sugar free and wanted to know if he could drink it or not. jw

## 2014-05-24 ENCOUNTER — Other Ambulatory Visit (HOSPITAL_COMMUNITY): Payer: Self-pay | Admitting: Anesthesiology

## 2014-05-24 ENCOUNTER — Other Ambulatory Visit: Payer: Self-pay | Admitting: Family Medicine

## 2014-05-24 DIAGNOSIS — E1165 Type 2 diabetes mellitus with hyperglycemia: Secondary | ICD-10-CM

## 2014-05-24 DIAGNOSIS — IMO0002 Reserved for concepts with insufficient information to code with codable children: Secondary | ICD-10-CM

## 2014-05-24 NOTE — Telephone Encounter (Signed)
Refill request. Will forward to PCP for review. Nasiyah Laverdiere, CMA. 

## 2014-05-25 ENCOUNTER — Telehealth: Payer: Self-pay | Admitting: Internal Medicine

## 2014-05-25 NOTE — Telephone Encounter (Signed)
Cell adapter still on back order pt should have adapter no later than 06-22-14. Pt aware and verbalized understanding.

## 2014-05-25 NOTE — Telephone Encounter (Signed)
New message    Patient calling checking on status of heart monitor has not receive it yet .

## 2014-05-25 NOTE — Telephone Encounter (Signed)
Patient asking about cellular adaptor that he has not received yet. Left message for him to call back

## 2014-05-26 ENCOUNTER — Telehealth: Payer: Self-pay | Admitting: Internal Medicine

## 2014-05-26 NOTE — Telephone Encounter (Signed)
Gave pt tech service number to call to receive help w/ trouble shooting home monitor.

## 2014-05-26 NOTE — Telephone Encounter (Signed)
New Msg        Pt states he has questions about a box that was being delivered and some concerns about device?   Please call back.

## 2014-05-30 ENCOUNTER — Ambulatory Visit (INDEPENDENT_AMBULATORY_CARE_PROVIDER_SITE_OTHER): Payer: Medicaid Other | Admitting: Family Medicine

## 2014-05-30 ENCOUNTER — Encounter: Payer: Self-pay | Admitting: Family Medicine

## 2014-05-30 VITALS — BP 134/89 | HR 84 | Temp 98.1°F | Ht 65.0 in | Wt 176.2 lb

## 2014-05-30 DIAGNOSIS — E1165 Type 2 diabetes mellitus with hyperglycemia: Secondary | ICD-10-CM

## 2014-05-30 DIAGNOSIS — IMO0002 Reserved for concepts with insufficient information to code with codable children: Secondary | ICD-10-CM

## 2014-05-30 LAB — POCT URINALYSIS DIPSTICK
Bilirubin, UA: NEGATIVE
Glucose, UA: 500
Ketones, UA: NEGATIVE
Leukocytes, UA: NEGATIVE
NITRITE UA: NEGATIVE
PROTEIN UA: NEGATIVE
RBC UA: NEGATIVE
Spec Grav, UA: 1.01
UROBILINOGEN UA: 0.2
pH, UA: 5.5

## 2014-05-30 NOTE — Patient Instructions (Signed)
It was great seeing you today.   1. Continue taking 30 units of insulin a day.  2. Check your blood sugar 2-3 times a week and call me if you are getting number greater than 150 or below 70.    Please bring all your medications to every doctors visit  Sign up for My Chart to have easy access to your labs results, and communication with your Primary care physician.  Next Appointment  Please make an appointment with Dr Berkley Harvey in [facility] to late June   I look forward to talking with you again at our next visit. If you have any questions or concerns before then, please call the clinic at [phone].  Take Care,   Dr Phill Myron

## 2014-05-30 NOTE — Progress Notes (Signed)
I was available as preceptor to resident for this patient's office visit.  

## 2014-05-30 NOTE — Assessment & Plan Note (Addendum)
He reports BGs usually ~ 100. No Hypoglycemia. No BG > 150. Current taking Lantus 20u am and 10u pm.  - Advised he should be able to take 30u qd with same effect.  - f/u with PCP in 2 months for A1c - Advised checking BG 2-3 x weekly due to cost of strips - check UA and urine microalbulmin

## 2014-05-30 NOTE — Progress Notes (Signed)
  Patient name: Joseph [name] MRN [id number]  Date of birth: 1965/10/23  CC & HPI:  Joseph [name] is a 49 y.o. male presenting today for DM.   DIABETES   Blood Sugar Ranges: 97-130s  Symptoms of Hypoglycemia? no  Comorbid Symptoms: no Chest pain; no SOB; no Neuropathy: no Vision problems  Medication Compliance: yes   Medication Side Effects: None  Counseling  Diet pattern: Switched to sugar-free koolaid   ROS: See HPI   Medical & Surgical Hx:  Reviewed  Medications & Allergies: Reviewed  Social History: Reviewed:   Objective Findings:  Vitals: BP 134/89 mmHg  Pulse 84  Temp(Src) 98.1 F (36.7 C) (Oral)  Ht 5\' 5"  (1.651 m)  Wt 176 lb 4 oz (79.946 kg)  BMI 29.33 kg/m2  Gen: NAD CV: RRR w/o m/r/g, pulses +2 b/l Resp: CTAB w/ normal respiratory effort Lower Ext: No skin changes; No edema; distal pulses intact; Calves nontender  Assessment & Plan:   Please See Problem Focused Assessment & Plan

## 2014-05-31 LAB — MICROALBUMIN, URINE

## 2014-06-06 ENCOUNTER — Telehealth: Payer: Self-pay | Admitting: Family Medicine

## 2014-06-06 ENCOUNTER — Other Ambulatory Visit: Payer: Self-pay | Admitting: Family Medicine

## 2014-06-06 DIAGNOSIS — Z1211 Encounter for screening for malignant neoplasm of colon: Secondary | ICD-10-CM

## 2014-06-06 NOTE — Telephone Encounter (Signed)
Said that PCP told him he needed to get a colonoscopy but he needs the referral done for his insurance. Please contact the pt when complete / Fonda Kinder, ASA

## 2014-06-13 ENCOUNTER — Encounter (HOSPITAL_COMMUNITY): Payer: Self-pay

## 2014-06-13 ENCOUNTER — Emergency Department (HOSPITAL_COMMUNITY)
Admission: EM | Admit: 2014-06-13 | Discharge: 2014-06-13 | Disposition: A | Discharge status: Home / Self Care | Payer: Medicaid Other | Attending: Emergency Medicine | Admitting: Emergency Medicine

## 2014-06-13 DIAGNOSIS — Z794 Long term (current) use of insulin: Secondary | ICD-10-CM | POA: Insufficient documentation

## 2014-06-13 DIAGNOSIS — I10 Essential (primary) hypertension: Secondary | ICD-10-CM | POA: Insufficient documentation

## 2014-06-13 DIAGNOSIS — Z95810 Presence of automatic (implantable) cardiac defibrillator: Secondary | ICD-10-CM | POA: Diagnosis not present

## 2014-06-13 DIAGNOSIS — Z8701 Personal history of pneumonia (recurrent): Secondary | ICD-10-CM | POA: Diagnosis not present

## 2014-06-13 DIAGNOSIS — R21 Rash and other nonspecific skin eruption: Secondary | ICD-10-CM | POA: Insufficient documentation

## 2014-06-13 DIAGNOSIS — Z720 Tobacco use: Secondary | ICD-10-CM | POA: Insufficient documentation

## 2014-06-13 DIAGNOSIS — I509 Heart failure, unspecified: Secondary | ICD-10-CM | POA: Insufficient documentation

## 2014-06-13 DIAGNOSIS — Z79899 Other long term (current) drug therapy: Secondary | ICD-10-CM | POA: Diagnosis not present

## 2014-06-13 DIAGNOSIS — Z9889 Other specified postprocedural states: Secondary | ICD-10-CM | POA: Insufficient documentation

## 2014-06-13 DIAGNOSIS — E780 Pure hypercholesterolemia: Secondary | ICD-10-CM | POA: Insufficient documentation

## 2014-06-13 DIAGNOSIS — Z8673 Personal history of transient ischemic attack (TIA), and cerebral infarction without residual deficits: Secondary | ICD-10-CM | POA: Diagnosis not present

## 2014-06-13 DIAGNOSIS — E119 Type 2 diabetes mellitus without complications: Secondary | ICD-10-CM | POA: Diagnosis not present

## 2014-06-13 DIAGNOSIS — I2510 Atherosclerotic heart disease of native coronary artery without angina pectoris: Secondary | ICD-10-CM | POA: Diagnosis not present

## 2014-06-13 MED ORDER — LORATADINE 10 MG PO TABS: 10.0000 mg | ORAL_TABLET | Freq: Every day | ORAL | Status: DC

## 2014-06-13 MED ORDER — PERMETHRIN 5 % EX CREA: TOPICAL_CREAM | CUTANEOUS | Status: DC

## 2014-06-13 MED ORDER — DIPHENHYDRAMINE HCL 25 MG PO TABS: 25.0000 mg | ORAL_TABLET | Freq: Four times a day (QID) | ORAL | Status: DC

## 2014-06-13 NOTE — ED Provider Notes (Signed)
CSN: [id number]     Arrival date & time 06/13/14  1730 History  This chart was scribed for Delos Haring, PA-C, working with Serita Grit, MD by Starleen Arms, ED Scribe. This patient was seen in room TR10C/TR10C and the patient's care was started at 5:58 PM.   Chief Complaint  Patient presents with  . Rash   HPI HPI Comments: Joseph [name] is a 49 y.o. male who presents to the Emergency Department complaining of an itching rash worse at night to the bilateral arms and legs onset one week ago.  Patient reports he recently he used a new soap for the first time the day of onset and later that night, woke up with his current complaint. He denies leaving his home the day of onset or being exposed to new environments. Patient reports he is unemployed and spends most of his time at home with his girlfriend.  She also is unemployed and spends a majority of time in the house but does not have a similar complaint.  Patient denies having new furniture, staying in hotels or friends' homes.   Patient denies wheezing, SOB, fever, n/v.  NKA.   Past Medical History  Diagnosis Date  . Hypertension   . Shortness of breath   . High cholesterol   . CHF (congestive heart failure)   . Pneumonia ?2004  . Type II diabetes mellitus   . CAD (coronary artery disease)     a. 06/28/13 non obst dz. managed medically  . Non-ischemic cardiomyopathy     a. 06/28/13 ECHO: EF 25% and global hypokinesis and cath wuth EF 15%   . Automatic implantable cardioverter-defibrillator in situ 12/01/2013    SINGLE CHAMBER BY DR Olin Pia  . Stroke    Past Surgical History  Procedure Laterality Date  . Finger amputation Right 1987    "reattached middle and ring fingers"  . Tee without cardioversion N/A 07/06/2013    Procedure: TRANSESOPHAGEAL ECHOCARDIOGRAM (TEE);  Surgeon: [name], MD;  Location: [facility];  Service: Cardiovascular;  Laterality: N/A;  . Ep implantable device  12/01/2013    single chamber by dr Caryl Comes  .  Left heart catheterization with coronary angiogram N/A 06/30/2013    Procedure: LEFT HEART CATHETERIZATION WITH CORONARY ANGIOGRAM;  Surgeon: Troy Sine, MD;  Location: [facility] CATH LAB;  Service: Cardiovascular;  Laterality: N/A;  . Implantable cardioverter defibrillator implant N/A 12/01/2013    Procedure: IMPLANTABLE CARDIOVERTER DEFIBRILLATOR IMPLANT;  Surgeon: Deboraha Sprang, MD;  Location: [facility] CATH LAB;  Service: Cardiovascular;  Laterality: N/A;   No family history on file. History  Substance Use Topics  . Smoking status: Current Some Day Smoker -- 1.00 packs/day for 31 years    Types: Cigarettes    Last Attempt to Quit: 06/28/2013  . Smokeless tobacco: Never Used     Comment: still smokes occasionally  . Alcohol Use: 19.2 oz/week    21 Cans of beer, 11 Shots of liquor per week     Comment: 06/29/2013 "2-3 beers/day; maybe 1 pint/wk"    Review of Systems  Constitutional: Negative for fever.  Respiratory: Negative for shortness of breath and wheezing.   Gastrointestinal: Negative for nausea and vomiting.  Skin: Positive for rash.      Allergies  Review of patient's allergies indicates no known allergies.  Home Medications   Prior to Admission medications   Medication Sig Start Date End Date Taking? Authorizing Provider  apixaban (ELIQUIS) 5 MG TABS tablet Take 1 tablet (  5 mg total) by mouth 2 (two) times daily. 04/13/14  Yes Deboraha Sprang, MD  atorvastatin (LIPITOR) 80 MG tablet Take 1 tablet (80 mg total) by mouth daily. 03/15/14  Yes Larey Dresser, MD  carvedilol (COREG) 25 MG tablet TAKE 1 TABLET BY MOUTH 2 TIMES DAILY WITH A MEAL. 05/06/14  Yes Amy D Clegg, NP  furosemide (LASIX) 40 MG tablet TAKE 1 TABLET BY MOUTH ONCE DAILY 05/24/14  Yes Amy D Clegg, NP  insulin glargine (LANTUS) 100 UNIT/ML injection Inject 0.1-0.6 mLs (10-60 Units total) into the skin every morning. Patient taking differently: Inject 10-60 Units into the skin every morning. Patient states he now takes 30  units every day per patient 05/09/14  Yes Olam Idler, MD  metFORMIN (GLUCOPHAGE) 500 MG tablet TAKE 2 TABLETS BY MOUTH 2 TIMES DAILY WITH A MEAL. 05/24/14  Yes Olam Idler, MD  sacubitril-valsartan (ENTRESTO) 49-51 MG Take 1 tablet by mouth 2 (two) times daily. 04/07/14  Yes Jolaine Artist, MD  spironolactone (ALDACTONE) 25 MG tablet Take 1/2 tablet (12.5 mg) by mouth once daily 03/15/14  Yes Deboraha Sprang, MD  Blood Glucose Monitoring Suppl ([place]) [place] Patient will need to test his blood glucose once daily prior to breakfast and metformin and amaryl administration. 07/07/13   Olam Idler, MD  diphenhydrAMINE (BENADRYL) 25 MG tablet Take 1 tablet (25 mg total) by mouth every 6 (six) hours. 06/13/14   Delos Haring, PA-C  furosemide (LASIX) 40 MG tablet Take one tablet (40 mg) by mouth every other day 03/15/14   Deboraha Sprang, MD  glucose blood test strip Use as instructed 07/07/13   Olam Idler, MD  Insulin Syringes, Disposable, U-100 0.3 ML MISC 1 each by Does not apply route daily. 05/09/14   Olam Idler, MD  loratadine (CLARITIN) 10 MG tablet Take 1 tablet (10 mg total) by mouth daily. 06/13/14   Kinsley Nicklaus Carlota Raspberry, PA-C  permethrin (ELIMITE) 5 % cream Apply to affected area once 06/13/14   Delos Haring, PA-C  ReliOn Ultra Thin Lancets MISC Patient will need to test his blood glucose once daily prior to breakfast and metformin and amaryl administration. 07/07/13   Olam Idler, MD  sacubitril-valsartan (ENTRESTO) 49-51 MG Take 1 tablet by mouth 2 (two) times daily. Patient not taking: Reported on 06/13/2014 04/07/14   Shaune Pascal Bensimhon, MD   BP 139/87 mmHg  Pulse 85  Temp(Src) 97.9 F (36.6 C) (Oral)  Resp 18  SpO2 100% Physical Exam  Constitutional: He is oriented to person, place, and time. He appears well-developed and well-nourished. No distress.  Pt is well appearing  HENT:  Head: Normocephalic and atraumatic.  Eyes: Conjunctivae and EOM are normal.  Neck: Neck  supple. No tracheal deviation present.  Cardiovascular: Normal rate.   Pulmonary/Chest: Effort normal. No respiratory distress.  Musculoskeletal: Normal range of motion.  Neurological: He is alert and oriented to person, place, and time.  Skin: Skin is warm and dry. Rash noted. He is not diaphoretic.  Sparse small scabs to upper arms and bilateral legs. Approximately a total of 7 excoriated lesions. Pt points to area of rash where no abnormality is seen by this provider. No induration, erythema, edema or associated drainage.  Psychiatric: He has a normal mood and affect. His behavior is normal.  Nursing note and vitals reviewed.   ED Course  Procedures (including critical care time)  DIAGNOSTIC STUDIES: Oxygen Saturation is 100% on RA, normal  by my interpretation.    COORDINATION OF CARE:  6:20 PM Discussed treatment plan with patient at bedside.  Patient acknowledges and agrees with plan.    Labs Review Labs Reviewed - No data to display  Imaging Review No results found.   EKG Interpretation None      MDM   Final diagnoses:  Rash    Rx: Permethrin cream and Claritin, benadryl   49 y.o.Tullio Chausse [place]'s evaluation in the Emergency Department is complete. It has been determined that no acute conditions requiring further emergency intervention are present at this time. The patient/guardian have been advised of the diagnosis and plan. We have discussed signs and symptoms that warrant return to the ED, such as changes or worsening in symptoms.  Vital signs are stable at discharge. Filed Vitals:   06/13/14 1742  BP: 139/87  Pulse: 85  Temp: 97.9 F (36.6 C)  Resp: 18    Patient/guardian has voiced understanding and agreed to follow-up with the PCP or specialist.\  I personally performed the services described in this documentation, which was scribed in my presence. The recorded information has been reviewed and is accurate.    Delos Haring, PA-C 06/15/14  1921  Serita Grit, MD 06/17/14 1005

## 2014-06-13 NOTE — ED Notes (Signed)
Pt. Reports rash to bilateral arms/legs x1 week. Reports recent change in soap.

## 2014-06-13 NOTE — Discharge Instructions (Signed)
Rash A rash is a change in the color or texture of your skin. There are many different types of rashes. You may have other problems that accompany your rash. CAUSES   Infections.  Allergic reactions. This can include allergies to pets or foods.  Certain medicines.  Exposure to certain chemicals, soaps, or cosmetics.  Heat.  Exposure to poisonous plants.  Tumors, both cancerous and noncancerous. SYMPTOMS   Redness.  Scaly skin.  Itchy skin.  Dry or cracked skin.  Bumps.  Blisters.  Pain. DIAGNOSIS  Your caregiver may do a physical exam to determine what type of rash you have. A skin sample (biopsy) may be taken and examined under a microscope. TREATMENT  Treatment depends on the type of rash you have. Your caregiver may prescribe certain medicines. For serious conditions, you may need to see a skin doctor (dermatologist). HOME CARE INSTRUCTIONS   Avoid the substance that caused your rash.  Do not scratch your rash. This can cause infection.  You may take cool baths to help stop itching.  Only take over-the-counter or prescription medicines as directed by your caregiver.  Keep all follow-up appointments as directed by your caregiver. SEEK IMMEDIATE MEDICAL CARE IF:  You have increasing pain, swelling, or redness.  You have a fever.  You have new or severe symptoms.  You have body aches, diarrhea, or vomiting.  Your rash is not better after 3 days. MAKE SURE YOU:  Understand these instructions.  Will watch your condition.  Will get help right away if you are not doing well or get worse. Document Released: 02/08/2002 Document Revised: 05/13/2011 Document Reviewed: 12/03/2010 [facility] Patient Information 2015 [place], [place]. This information is not intended to replace advice given to you by your health care provider. Make sure you discuss any questions you have with your health care provider.   Insect Bite Mosquitoes, flies, fleas, bedbugs, and many  other insects can bite. Insect bites are different from insect stings. A sting is when venom is injected into the skin. Some insect bites can transmit infectious diseases. SYMPTOMS  Insect bites usually turn red, swell, and itch for 2 to 4 days. They often go away on their own. TREATMENT  Your caregiver may prescribe antibiotic medicines if a bacterial infection develops in the bite. HOME CARE INSTRUCTIONS  Do not scratch the bite area.  Keep the bite area clean and dry. Wash the bite area thoroughly with soap and water.  Put ice or cool compresses on the bite area.  Put ice in a plastic bag.  Place a towel between your skin and the bag.  Leave the ice on for 20 minutes, 4 times a day for the first 2 to 3 days, or as directed.  You may apply a baking soda paste, cortisone cream, or calamine lotion to the bite area as directed by your caregiver. This can help reduce itching and swelling.  Only take over-the-counter or prescription medicines as directed by your caregiver.  If you are given antibiotics, take them as directed. Finish them even if you start to feel better. You may need a tetanus shot if:  You cannot remember when you had your last tetanus shot.  You have never had a tetanus shot.  The injury broke your skin. If you get a tetanus shot, your arm may swell, get red, and feel warm to the touch. This is common and not a problem. If you need a tetanus shot and you choose not to have one, there is  a rare chance of getting tetanus. Sickness from tetanus can be serious. SEEK IMMEDIATE MEDICAL CARE IF:   You have increased pain, redness, or swelling in the bite area.  You see a red line on the skin coming from the bite.  You have a fever.  You have joint pain.  You have a headache or neck pain.  You have unusual weakness.  You have a rash.  You have chest pain or shortness of breath.  You have abdominal pain, nausea, or vomiting.  You feel unusually tired or  sleepy. MAKE SURE YOU:   Understand these instructions.  Will watch your condition.  Will get help right away if you are not doing well or get worse. Document Released: 03/28/2004 Document Revised: 05/13/2011 Document Reviewed: 09/19/2010 [facility] Patient Information 2015 [place], [place]. This information is not intended to replace advice given to you by your health care provider. Make sure you discuss any questions you have with your health care provider.

## 2014-06-13 NOTE — ED Notes (Signed)
Declined W/C at D/C and was escorted to lobby by RN. 

## 2014-06-14 ENCOUNTER — Ambulatory Visit (INDEPENDENT_AMBULATORY_CARE_PROVIDER_SITE_OTHER): Payer: Medicaid Other | Admitting: *Deleted

## 2014-06-14 DIAGNOSIS — I429 Cardiomyopathy, unspecified: Secondary | ICD-10-CM

## 2014-06-14 DIAGNOSIS — I428 Other cardiomyopathies: Secondary | ICD-10-CM

## 2014-06-14 NOTE — Progress Notes (Signed)
Remote ICD transmission.   

## 2014-06-15 ENCOUNTER — Telehealth: Payer: Self-pay | Admitting: *Deleted

## 2014-06-15 NOTE — Telephone Encounter (Signed)
Spoke with patient and gave him appointment information along with office location and phone number.  Patient states that this time will work good for him. Jazmin Hartsell,CMA

## 2014-06-15 NOTE — Telephone Encounter (Signed)
-----   Message from Olam Idler, MD sent at 06/15/2014 11:45 AM EDT ----- Please call and advised him that he has appointment with GI (Dr Benson Norway) on 06/20/14 @ 8:30apm

## 2014-06-19 LAB — MDC_IDC_ENUM_SESS_TYPE_REMOTE
Date Time Interrogation Session: 20160412132700
HighPow Impedance: 62 Ohm
Implantable Pulse Generator Serial Number: 191604
Lead Channel Impedance Value: 413 Ohm
Lead Channel Setting Pacing Amplitude: 2.4 V
Lead Channel Setting Pacing Pulse Width: 0.4 ms
MDC IDC MSMT BATTERY REMAINING LONGEVITY: 144 mo
MDC IDC MSMT BATTERY REMAINING PERCENTAGE: 100 %
MDC IDC SET LEADCHNL RV SENSING SENSITIVITY: 0.6 mV
MDC IDC SET ZONE DETECTION INTERVAL: 250 ms
MDC IDC SET ZONE DETECTION INTERVAL: 300 ms
MDC IDC STAT BRADY RV PERCENT PACED: 0 %

## 2014-06-20 ENCOUNTER — Other Ambulatory Visit: Payer: Self-pay | Admitting: Gastroenterology

## 2014-06-21 ENCOUNTER — Telehealth: Payer: Self-pay | Admitting: Internal Medicine

## 2014-06-21 NOTE — Telephone Encounter (Signed)
Received medical records from [facility], sent to Dr. Caryl Comes. 06/21/14/ss

## 2014-07-01 ENCOUNTER — Ambulatory Visit (HOSPITAL_COMMUNITY)
Admission: RE | Admit: 2014-07-01 | Discharge: 2014-07-01 | Disposition: A | Discharge status: Home / Self Care | Payer: Medicaid Other | Source: Ambulatory Visit | Attending: Internal Medicine | Admitting: Internal Medicine

## 2014-07-01 ENCOUNTER — Ambulatory Visit (HOSPITAL_BASED_OUTPATIENT_CLINIC_OR_DEPARTMENT_OTHER)
Admission: RE | Admit: 2014-07-01 | Discharge: 2014-07-01 | Disposition: A | Discharge status: Home / Self Care | Payer: Medicaid Other | Source: Ambulatory Visit | Attending: Internal Medicine | Admitting: Internal Medicine

## 2014-07-01 ENCOUNTER — Encounter (HOSPITAL_COMMUNITY): Payer: Self-pay | Admitting: *Deleted

## 2014-07-01 ENCOUNTER — Encounter (HOSPITAL_COMMUNITY): Payer: Self-pay

## 2014-07-01 VITALS — BP 128/86 | HR 85 | Wt 183.1 lb

## 2014-07-01 DIAGNOSIS — I10 Essential (primary) hypertension: Secondary | ICD-10-CM

## 2014-07-01 DIAGNOSIS — I5022 Chronic systolic (congestive) heart failure: Secondary | ICD-10-CM | POA: Diagnosis not present

## 2014-07-01 DIAGNOSIS — I5043 Acute on chronic combined systolic (congestive) and diastolic (congestive) heart failure: Secondary | ICD-10-CM | POA: Diagnosis not present

## 2014-07-01 DIAGNOSIS — I509 Heart failure, unspecified: Secondary | ICD-10-CM | POA: Diagnosis not present

## 2014-07-01 LAB — BASIC METABOLIC PANEL
ANION GAP: 8 (ref 5–15)
BUN: 12 mg/dL (ref 6–23)
CO2: 27 mmol/L (ref 19–32)
CREATININE: 1.05 mg/dL (ref 0.50–1.35)
Calcium: 9.6 mg/dL (ref 8.4–10.5)
Chloride: 106 mmol/L (ref 96–112)
GFR calc non Af Amer: 81 mL/min — ABNORMAL LOW (ref >90)
GLUCOSE: 104 mg/dL — AB (ref 70–99)
Potassium: 4.2 mmol/L (ref 3.5–5.1)
Sodium: 141 mmol/L (ref 135–145)

## 2014-07-01 LAB — LIPID PANEL
CHOLESTEROL: 137 mg/dL (ref 0–200)
HDL: 43 mg/dL (ref >39)
LDL CALC: 75 mg/dL (ref 0–99)
Total CHOL/HDL Ratio: 3.2 RATIO
Triglycerides: 96 mg/dL (ref <150)
VLDL: 19 mg/dL (ref 0–40)

## 2014-07-01 LAB — CBC
HEMATOCRIT: 42.6 % (ref 39.0–52.0)
HEMOGLOBIN: 13.9 g/dL (ref 13.0–17.0)
MCH: 31.7 pg (ref 26.0–34.0)
MCHC: 32.6 g/dL (ref 30.0–36.0)
MCV: 97.3 fL (ref 78.0–100.0)
PLATELETS: 207 10*3/uL (ref 150–400)
RBC: 4.38 MIL/uL (ref 4.22–5.81)
RDW: 13.3 % (ref 11.5–15.5)
WBC: 10.4 10*3/uL (ref 4.0–10.5)

## 2014-07-01 MED ORDER — SPIRONOLACTONE 25 MG PO TABS: 25.0000 mg | ORAL_TABLET | Freq: Every morning | ORAL | Status: DC

## 2014-07-01 NOTE — Progress Notes (Signed)
  Echocardiogram 2D Echocardiogram has been performed.  Darlina Sicilian M 07/01/2014, 9:49 AM

## 2014-07-01 NOTE — Patient Instructions (Signed)
Increase Spironolactone to 25 mg (1 tab) daily  Your physician has recommended that you have a sleep study. This test records several body functions during sleep, including: brain activity, eye movement, oxygen and carbon dioxide blood levels, heart rate and rhythm, breathing rate and rhythm, the flow of air through your mouth and nose, snoring, body muscle movements, and chest and belly movement.  Labs today  Labs in 2 weeks  We will contact you in 3 months to schedule your next appointment.

## 2014-07-02 NOTE — Progress Notes (Signed)
Patient ID: Joseph [name], male   DOB: 1965/08/28, 49 y.o.   MRN: [id number] PCP: Dr. Berkley Harvey [facility]) EP: Dr Caryl Comes   HPI: Joseph [name] is a 49 yo male with a history of chronic systolic HF, nonischemic cardiomyopathy, HTN, TIA, stroke, DM2, paroxysmal atrial fibrillation tobacco abuse and polysubstance abuse.   R/LHC 06/30/13: RA: 6  RV: 26/9  PA: 26/13  PC: a 18 mean 13 CO/CI Fick: 3.7/2.0 Mild CAD with 30% narrowing in mid AV groove circumflex, and 30% proximal and 40% mid RCA stenosis with otherwise normal LAD and ramus  ECHO (4/15) EF 20%, mild MR, no embolus MRI head: acute infraction with > 3 cm infarct involving L frontal opercular cortex and subcortical white matter (07/2013) ECHO (10/13/13) EF 20-25%  ECHO (4/16) EF 30-35%, diffuse hypokinesis, normal RV size and systolic function.   Patient had atrial fibrillation noted on ICD interrogation and started apixaban in 1/16.   Today, he seems to be doing well.  No melena or BRBPR with apixaban use.  Weight is stable.  No exertional dyspnea, orthopnea, or PND.  No chest pain.  No palpitations (has not noticed atrial fibrillation).  He snores and has daytime sleepiness.   Labs: (07/21/13) K 4.3, creatinine 1.03 (08/27/13 ) K 4.5 Creatinine 0.91  (11/24/13): K 4.4 Creatinine 1.1  (12/09/13): K 4.1, creatinine 0.8 (01/18/14) K 4.6, creatinine 1.01 (1/16): HCT 43.7 (3/16): K 4.6, creatinine 0.8  ECG: NSR, LAFB, poor RWP  SH: No smoking, no ETOH and no drugs as of 06/28/13. Not working. Applied for disability. Has orange card.    FH: Mother: DM2, living        Father deceased, no issues  ROS: All systems negative except as listed in HPI, PMH and Problem List.  PMH: 1) HTN 2) NICM: LHC (06/2013): Mild coronary obstructive disease with 30% narrowing in the mid AV groove circumflex, and 30% proximal and 40% mid RCA stenoses with otherwise normal LAD and ramus intermediate vessel.  RHC (06/2013) RA: 6, RV: 26/9, PA: 26/13, PC:  a 18 mean 13, CO/CI Fick: 3.7/2.0, left ventriculography EF 15%; ECHO (06/2013) EF 20%, diffuse HK, mild MR; TEE (07/2013) no thrombus; ECHO (10/2013): EF 20-25%, diff HK, LA mod dilated; [employer] ICD implanted (12/01/13).  Echo (4/16) with EF 30-35%, diffuse hypokinesis, normal RV.  3) DM2 4) Stroke: MRI (07/2013) revealed an acute infarction, with a greater than 3 cm infarct involving left frontal opercular cortex and subcortical white matter. Carotid dopplers (07/2013) mild soft plaque origin ICA. 1-39% ICA stenosis. Vertebral artery flow is antegrade 5) Polysubstance abuse: + cocaine UDS in past.  6) Atrial fibrillation: Paroxysmal, started Eliquis in 1/16.    Current Outpatient Prescriptions  Medication Sig Dispense Refill  . apixaban (ELIQUIS) 5 MG TABS tablet Take 1 tablet (5 mg total) by mouth 2 (two) times daily. 180 tablet 2  . atorvastatin (LIPITOR) 80 MG tablet Take 80 mg by mouth every evening.    . Blood Glucose Monitoring Suppl ([place]) [place] Patient will need to test his blood glucose once daily prior to breakfast and metformin and amaryl administration. 1 Device 0  . carvedilol (COREG) 25 MG tablet TAKE 1 TABLET BY MOUTH 2 TIMES DAILY WITH A MEAL. 60 tablet 5  . diphenhydrAMINE (BENADRYL) 25 MG tablet Take 1 tablet (25 mg total) by mouth every 6 (six) hours. 20 tablet 0  . furosemide (LASIX) 40 MG tablet Take 40 mg by mouth every other day.    Marland Kitchen  glucose blood test strip Use as instructed (Patient taking differently: 1 each by Other route 3 (three) times a week. Use as instructed) 100 each 12  . insulin glargine (LANTUS) 100 UNIT/ML injection Inject 0.1-0.6 mLs (10-60 Units total) into the skin every morning. (Patient taking differently: Inject 10-60 Units into the skin every morning. Patient states he now takes 30 units every day per patient) 20 mL 2  . Insulin Syringes, Disposable, U-100 0.3 ML MISC 1 each by Does not apply route daily. 100 each 1  . metFORMIN  (GLUCOPHAGE) 500 MG tablet TAKE 2 TABLETS BY MOUTH 2 TIMES DAILY WITH A MEAL. 120 tablet 5  . ReliOn Ultra Thin Lancets MISC Patient will need to test his blood glucose once daily prior to breakfast and metformin and amaryl administration. 100 each 2  . sacubitril-valsartan (ENTRESTO) 49-51 MG Take 1 tablet by mouth 2 (two) times daily. 180 tablet 3  . spironolactone (ALDACTONE) 25 MG tablet Take 1 tablet (25 mg total) by mouth every morning. 30 tablet 2   No current facility-administered medications for this encounter.     Filed Vitals:   07/01/14 1001  BP: 128/86  Pulse: 85  Weight: 183 lb 1.9 oz (83.063 kg)  SpO2: 99%    PHYSICAL EXAM: General:  Well appearing. No resp difficulty.  HEENT: normal Neck: supple. JVP flat. Carotids 2+ bilaterally; no bruits. No lymphadenopathy or thryomegaly appreciated. Cor: PMI normal. Regular rate & rhythm. No rubs, gallops or murmurs. Lungs: clear Abdomen: soft, nontender, nondistended. No hepatosplenomegaly. No bruits or masses. Good bowel sounds. Extremities: no cyanosis, clubbing, rash, edema Neuro: alert & orientedx3, cranial nerves grossly intact. Moves all 4 extremities w/o difficulty. Affect pleasant.  ASSESSMENT & PLAN:  1) Chronic systolic HF: Nonischemic cardiomyopathy s/p [employer] ICD. Narrow QRS so not CRT candidate.  I reviewed today's echo, EF 30-35% with diffuse hypokinesis.  NYHA I-II symptoms and volume status stable.  - Will continue Lasix 40 mg every other day.   - On goal dose coreg 25 mg BID. - Continue Entresto to 49-51 mg BID. Will check BMET today.  - Increase spironolactone to 25 mg daily (still taking 12.5 mg daily). Check BMET in 7-10 days.  - Reinforced the need and importance of daily weights, a low sodium diet, and fluid restriction (less than 2 L a day). Instructed to call the HF clinic if weight increases more than 3 lbs overnight or 5 lbs in a week.  2) Non-obstructive CAD: No chest pain. Continue  statin. Will check lipids today.  3) Atrial fibrillation: Paroxysmal, NSR today.  He is now on apixaban.  CBC today.  4) ?OSA: Now has Medicaid, will arrange sleep study.   5) Polysubstance abuse: Has been drug free since 06/2013. Keep up the good work.  6) Stroke: In 5/15, likely related to atrial fibrillation. Now on Eliquis.   Loralie Champagne 07/02/2014

## 2014-07-07 NOTE — Anesthesia Preprocedure Evaluation (Addendum)
Anesthesia Evaluation  Patient identified by MRN, date of birth, ID band Patient awake  General Assessment Comment:HTN 2) NICM: LHC (06/2013): Mild coronary obstructive disease with 30% narrowing in the mid AV groove circumflex, and 30% proximal and 40% mid RCA stenoses with otherwise normal LAD and ramus intermediate vessel. RHC (06/2013) RA: 6, RV: 26/9, PA: 26/13, PC: a 18 mean 13, CO/CI Fick: 3.7/2.0, left ventriculography EF 15%; ECHO (06/2013) EF 20%, diffuse HK, mild MR; TEE (07/2013) no thrombus; ECHO (10/2013): EF 20-25%, diff HK, LA mod dilated; [employer] ICD implanted (12/01/13). Echo (4/16) with EF 30-35%, diffuse hypokinesis, normal RV.  3) DM2 4) Stroke: MRI (07/2013) revealed an acute infarction, with a greater than 3 cm infarct involving left frontal opercular cortex and subcortical white matter. Carotid dopplers (07/2013) mild soft plaque origin ICA. 1-39% ICA stenosis. Vertebral artery flow is antegrade 5) Polysubstance abuse: + cocaine UDS in past.  6) Atrial fibrillation: Paroxysmal, started Eliquis in 1/16  Reviewed: Allergy & Precautions, NPO status , Patient's Chart, lab work & pertinent test results  History of Anesthesia Complications Negative for: history of anesthetic complications  Airway Mallampati: II  TM Distance: >3 FB Neck ROM: Full    Dental no notable dental hx. (+) Dental Advisory Given   Pulmonary Current Smoker,  breath sounds clear to auscultation  Pulmonary exam normal       Cardiovascular hypertension, Pt. on medications + CAD and +CHF Normal cardiovascular exam+ Cardiac Defibrillator IIRhythm:Regular Rate:Normal     Neuro/Psych CVA negative psych ROS   GI/Hepatic negative GI ROS, Neg liver ROS,   Endo/Other  diabetes, Insulin Dependent  Renal/GU negative Renal ROS  negative genitourinary   Musculoskeletal negative musculoskeletal ROS (+)   Abdominal   Peds negative pediatric  ROS (+)  Hematology negative hematology ROS (+)   Anesthesia Other Findings   Reproductive/Obstetrics negative OB ROS                           Anesthesia Physical Anesthesia Plan  ASA: IV  Anesthesia Plan: MAC   Post-op Pain Management:    Induction: Intravenous  Airway Management Planned: Natural Airway and Simple Face Mask  Additional Equipment:   Intra-op Plan:   Post-operative Plan:   Informed Consent: I have reviewed the patients History and Physical, chart, labs and discussed the procedure including the risks, benefits and alternatives for the proposed anesthesia with the patient or authorized representative who has indicated his/her understanding and acceptance.   Dental advisory given  Plan Discussed with: CRNA  Anesthesia Plan Comments:        Anesthesia Quick Evaluation

## 2014-07-08 ENCOUNTER — Ambulatory Visit (HOSPITAL_COMMUNITY): Payer: Medicaid Other | Admitting: Anesthesiology

## 2014-07-08 ENCOUNTER — Encounter (HOSPITAL_COMMUNITY): Payer: Self-pay | Admitting: *Deleted

## 2014-07-08 ENCOUNTER — Ambulatory Visit (HOSPITAL_COMMUNITY)
Admission: RE | Admit: 2014-07-08 | Discharge: 2014-07-08 | Disposition: A | Discharge status: Home / Self Care | Payer: Medicaid Other | Source: Ambulatory Visit | Attending: Gastroenterology | Admitting: Gastroenterology

## 2014-07-08 ENCOUNTER — Encounter (HOSPITAL_COMMUNITY): Payer: Self-pay | Admitting: Registered Nurse

## 2014-07-08 ENCOUNTER — Encounter (HOSPITAL_COMMUNITY)
Admission: RE | Disposition: A | Discharge status: Home / Self Care | Payer: Medicaid Other | Source: Ambulatory Visit | Attending: Gastroenterology

## 2014-07-08 DIAGNOSIS — Z8673 Personal history of transient ischemic attack (TIA), and cerebral infarction without residual deficits: Secondary | ICD-10-CM | POA: Diagnosis not present

## 2014-07-08 DIAGNOSIS — Z1211 Encounter for screening for malignant neoplasm of colon: Secondary | ICD-10-CM | POA: Insufficient documentation

## 2014-07-08 DIAGNOSIS — E119 Type 2 diabetes mellitus without complications: Secondary | ICD-10-CM | POA: Insufficient documentation

## 2014-07-08 DIAGNOSIS — Z79899 Other long term (current) drug therapy: Secondary | ICD-10-CM | POA: Diagnosis not present

## 2014-07-08 DIAGNOSIS — I2510 Atherosclerotic heart disease of native coronary artery without angina pectoris: Secondary | ICD-10-CM | POA: Insufficient documentation

## 2014-07-08 DIAGNOSIS — D175 Benign lipomatous neoplasm of intra-abdominal organs: Secondary | ICD-10-CM | POA: Insufficient documentation

## 2014-07-08 DIAGNOSIS — D123 Benign neoplasm of transverse colon: Secondary | ICD-10-CM | POA: Diagnosis not present

## 2014-07-08 DIAGNOSIS — F17200 Nicotine dependence, unspecified, uncomplicated: Secondary | ICD-10-CM | POA: Diagnosis not present

## 2014-07-08 DIAGNOSIS — Z794 Long term (current) use of insulin: Secondary | ICD-10-CM | POA: Diagnosis not present

## 2014-07-08 DIAGNOSIS — I10 Essential (primary) hypertension: Secondary | ICD-10-CM | POA: Insufficient documentation

## 2014-07-08 DIAGNOSIS — D120 Benign neoplasm of cecum: Secondary | ICD-10-CM | POA: Insufficient documentation

## 2014-07-08 DIAGNOSIS — I5022 Chronic systolic (congestive) heart failure: Secondary | ICD-10-CM | POA: Diagnosis not present

## 2014-07-08 DIAGNOSIS — Z7901 Long term (current) use of anticoagulants: Secondary | ICD-10-CM | POA: Diagnosis not present

## 2014-07-08 DIAGNOSIS — I480 Paroxysmal atrial fibrillation: Secondary | ICD-10-CM | POA: Insufficient documentation

## 2014-07-08 DIAGNOSIS — D125 Benign neoplasm of sigmoid colon: Secondary | ICD-10-CM | POA: Insufficient documentation

## 2014-07-08 DIAGNOSIS — Z95810 Presence of automatic (implantable) cardiac defibrillator: Secondary | ICD-10-CM | POA: Insufficient documentation

## 2014-07-08 HISTORY — PX: COLONOSCOPY WITH PROPOFOL: SHX5780

## 2014-07-08 LAB — GLUCOSE, CAPILLARY: GLUCOSE-CAPILLARY: 86 mg/dL (ref 70–99)

## 2014-07-08 SURGERY — COLONOSCOPY WITH PROPOFOL
Anesthesia: Monitor Anesthesia Care

## 2014-07-08 MED ORDER — LACTATED RINGERS IV SOLN
INTRAVENOUS | Status: DC
  Administered 2014-07-08: 100 via INTRAVENOUS

## 2014-07-08 MED ORDER — PROPOFOL 10 MG/ML IV BOLUS
INTRAVENOUS | Status: DC | PRN
  Administered 2014-07-08: 20 mg via INTRAVENOUS
  Administered 2014-07-08: 30 mg via INTRAVENOUS

## 2014-07-08 MED ORDER — PROPOFOL 10 MG/ML IV BOLUS
INTRAVENOUS | Status: AC
  Filled 2014-07-08: qty 20

## 2014-07-08 MED ORDER — PROPOFOL INFUSION 10 MG/ML OPTIME
INTRAVENOUS | Status: DC | PRN
  Administered 2014-07-08: 160 ug/kg/min via INTRAVENOUS

## 2014-07-08 MED ORDER — LIDOCAINE HCL (CARDIAC) 20 MG/ML IV SOLN
INTRAVENOUS | Status: AC
  Filled 2014-07-08: qty 5

## 2014-07-08 MED ORDER — SODIUM CHLORIDE 0.9 % IV SOLN: INTRAVENOUS | Status: DC

## 2014-07-08 MED FILL — Lactated Ringer's Solution: INTRAVENOUS | Qty: 1000 | Status: AC

## 2014-07-08 SURGICAL SUPPLY — 21 items

## 2014-07-08 NOTE — Anesthesia Procedure Notes (Signed)
Procedure Name: MAC Date/Time: 07/08/2014 9:36 AM Performed by: Carleene Cooper A Pre-anesthesia Checklist: Patient identified, Timeout performed, Emergency Drugs available, Suction available and Patient being monitored Patient Re-evaluated:Patient Re-evaluated prior to inductionOxygen Delivery Method: Simple face mask Dental Injury: Teeth and Oropharynx as per pre-operative assessment

## 2014-07-08 NOTE — Transfer of Care (Signed)
Immediate Anesthesia Transfer of Care Note  Patient: MAKAIO MACH  Procedure(s) Performed: Procedure(s): COLONOSCOPY WITH PROPOFOL (N/A)  Patient Location: PACU and Endoscopy Unit  Anesthesia Type:MAC  Level of Consciousness: awake, alert , oriented and patient cooperative  Airway & Oxygen Therapy: Patient Spontanous Breathing and Patient connected to face mask oxygen  Post-op Assessment: Report given to RN, Post -op Vital signs reviewed and stable and Patient moving all extremities  Post vital signs: Reviewed and stable  Last Vitals:  Filed Vitals:   07/08/14 0910  BP: 129/80  Pulse: 87  Resp: 22    Complications: No apparent anesthesia complications

## 2014-07-08 NOTE — Op Note (Signed)
[facility] [place], [address]   COLONOSCOPY PROCEDURE REPORT  PATIENT: Joseph, [name]  MR#: [id number] BIRTHDATE: 1965-06-29 , 48  yrs. old GENDER: male ENDOSCOPIST: Carol Ada, MD REFERRED BY: PROCEDURE DATE:  08/04/2014 PROCEDURE:   Colonoscopy with snare polypectomy ASA CLASS:   Class III INDICATIONS:Screening MEDICATIONS: Monitored anesthesia care  DESCRIPTION OF PROCEDURE:   After the risks and benefits and of the procedure were explained, informed consent was obtained.  revealed no abnormalities of the rectum.    The EC-[id number] ([id number]) endoscope was introduced through the anus and advanced to the cecum, which was identified by both the appendix and ileocecal valve .  The quality of the prep was adequate .  The instrument was then slowly withdrawn as the colon was fully examined. Estimated blood loss is zero unless otherwise noted in this procedure report.   FINDINGS: Four 3-5 mm sessile polyps were identified in the cecum, transverse colon, and sigmoid colon.  All the polyps were removed with a cold snare.  A moderately sized ascending colon lipoma was identified.     Retroflexed views revealed no abnormalities. The scope was then withdrawn from the patient and the procedure completed.  WITHDRAWAL TIME: 10 minutes 0 seconds  COMPLICATIONS: There were no immediate complications. ENDOSCOPIC IMPRESSION: 1) Polyps. 2) Lipoma.  RECOMMENDATIONS: 1) Follow up biopsies results. 2) Repeat the colonoscopy in 3 years.  REPEAT EXAM:  cc:  _______________________________ eSignedCarol Ada, MD 08/04/14 10:10 AM   CPT CODES: ICD CODES:  The ICD and CPT codes recommended by this software are interpretations from the data that the clinical staff has captured with the software.  The verification of the translation of this report to the ICD and CPT codes and modifiers is the sole responsibility of the health care institution  and practicing physician where this report was generated.  [employer] will not be held responsible for the validity of the ICD and CPT codes included on this report.  AMA assumes no liability for data contained or not contained herein. CPT is a Designer, television/film set of the [employer].   PATIENT NAME:  Joseph, [name] MR#: [id number]

## 2014-07-08 NOTE — Interval H&P Note (Signed)
History and Physical Interval Note:  07/08/2014 9:29 AM  Joseph [name]  has presented today for surgery, with the diagnosis of screening  The various methods of treatment have been discussed with the patient and family. After consideration of risks, benefits and other options for treatment, the patient has consented to  Procedure(s): COLONOSCOPY WITH PROPOFOL (N/A) as a surgical intervention .  The patient's history has been reviewed, patient examined, no change in status, stable for surgery.  I have reviewed the patient's chart and labs.  Questions were answered to the patient's satisfaction.     Alicia Seib D

## 2014-07-08 NOTE — H&P (View-Only) (Signed)
Patient ID: Joseph [name], male   DOB: February 02, 1966, 49 y.o.   MRN: [id number] PCP: Dr. Berkley Harvey [facility]) EP: Dr Caryl Comes   HPI: Joseph [name] is a 49 yo male with a history of chronic systolic HF, nonischemic cardiomyopathy, HTN, TIA, stroke, DM2, paroxysmal atrial fibrillation tobacco abuse and polysubstance abuse.   R/LHC 06/30/13: RA: 6  RV: 26/9  PA: 26/13  PC: a 18 mean 13 CO/CI Fick: 3.7/2.0 Mild CAD with 30% narrowing in mid AV groove circumflex, and 30% proximal and 40% mid RCA stenosis with otherwise normal LAD and ramus  ECHO (4/15) EF 20%, mild MR, no embolus MRI head: acute infraction with > 3 cm infarct involving L frontal opercular cortex and subcortical white matter (07/2013) ECHO (10/13/13) EF 20-25%  ECHO (4/16) EF 30-35%, diffuse hypokinesis, normal RV size and systolic function.   Patient had atrial fibrillation noted on ICD interrogation and started apixaban in 1/16.   Today, he seems to be doing well.  No melena or BRBPR with apixaban use.  Weight is stable.  No exertional dyspnea, orthopnea, or PND.  No chest pain.  No palpitations (has not noticed atrial fibrillation).  He snores and has daytime sleepiness.   Labs: (07/21/13) K 4.3, creatinine 1.03 (08/27/13 ) K 4.5 Creatinine 0.91  (11/24/13): K 4.4 Creatinine 1.1  (12/09/13): K 4.1, creatinine 0.8 (01/18/14) K 4.6, creatinine 1.01 (1/16): HCT 43.7 (3/16): K 4.6, creatinine 0.8  ECG: NSR, LAFB, poor RWP  SH: No smoking, no ETOH and no drugs as of 06/28/13. Not working. Applied for disability. Has orange card.    FH: Mother: DM2, living        Father deceased, no issues  ROS: All systems negative except as listed in HPI, PMH and Problem List.  PMH: 1) HTN 2) NICM: LHC (06/2013): Mild coronary obstructive disease with 30% narrowing in the mid AV groove circumflex, and 30% proximal and 40% mid RCA stenoses with otherwise normal LAD and ramus intermediate vessel.  RHC (06/2013) RA: 6, RV: 26/9, PA: 26/13, PC:  a 18 mean 13, CO/CI Fick: 3.7/2.0, left ventriculography EF 15%; ECHO (06/2013) EF 20%, diffuse HK, mild MR; TEE (07/2013) no thrombus; ECHO (10/2013): EF 20-25%, diff HK, LA mod dilated; [employer] ICD implanted (12/01/13).  Echo (4/16) with EF 30-35%, diffuse hypokinesis, normal RV.  3) DM2 4) Stroke: MRI (07/2013) revealed an acute infarction, with a greater than 3 cm infarct involving left frontal opercular cortex and subcortical white matter. Carotid dopplers (07/2013) mild soft plaque origin ICA. 1-39% ICA stenosis. Vertebral artery flow is antegrade 5) Polysubstance abuse: + cocaine UDS in past.  6) Atrial fibrillation: Paroxysmal, started Eliquis in 1/16.    Current Outpatient Prescriptions  Medication Sig Dispense Refill  . apixaban (ELIQUIS) 5 MG TABS tablet Take 1 tablet (5 mg total) by mouth 2 (two) times daily. 180 tablet 2  . atorvastatin (LIPITOR) 80 MG tablet Take 80 mg by mouth every evening.    . Blood Glucose Monitoring Suppl ([place]) DEVI Patient will need to test his blood glucose once daily prior to breakfast and metformin and amaryl administration. 1 Device 0  . carvedilol (COREG) 25 MG tablet TAKE 1 TABLET BY MOUTH 2 TIMES DAILY WITH A MEAL. 60 tablet 5  . diphenhydrAMINE (BENADRYL) 25 MG tablet Take 1 tablet (25 mg total) by mouth every 6 (six) hours. 20 tablet 0  . furosemide (LASIX) 40 MG tablet Take 40 mg by mouth every other day.    Marland Kitchen  glucose blood test strip Use as instructed (Patient taking differently: 1 each by Other route 3 (three) times a week. Use as instructed) 100 each 12  . insulin glargine (LANTUS) 100 UNIT/ML injection Inject 0.1-0.6 mLs (10-60 Units total) into the skin every morning. (Patient taking differently: Inject 10-60 Units into the skin every morning. Patient states he now takes 30 units every day per patient) 20 mL 2  . Insulin Syringes, Disposable, U-100 0.3 ML MISC 1 each by Does not apply route daily. 100 each 1  . metFORMIN  (GLUCOPHAGE) 500 MG tablet TAKE 2 TABLETS BY MOUTH 2 TIMES DAILY WITH A MEAL. 120 tablet 5  . ReliOn Ultra Thin Lancets MISC Patient will need to test his blood glucose once daily prior to breakfast and metformin and amaryl administration. 100 each 2  . sacubitril-valsartan (ENTRESTO) 49-51 MG Take 1 tablet by mouth 2 (two) times daily. 180 tablet 3  . spironolactone (ALDACTONE) 25 MG tablet Take 1 tablet (25 mg total) by mouth every morning. 30 tablet 2   No current facility-administered medications for this encounter.     Filed Vitals:   07/01/14 1001  BP: 128/86  Pulse: 85  Weight: 183 lb 1.9 oz (83.063 kg)  SpO2: 99%    PHYSICAL EXAM: General:  Well appearing. No resp difficulty.  HEENT: normal Neck: supple. JVP flat. Carotids 2+ bilaterally; no bruits. No lymphadenopathy or thryomegaly appreciated. Cor: PMI normal. Regular rate & rhythm. No rubs, gallops or murmurs. Lungs: clear Abdomen: soft, nontender, nondistended. No hepatosplenomegaly. No bruits or masses. Good bowel sounds. Extremities: no cyanosis, clubbing, rash, edema Neuro: alert & orientedx3, cranial nerves grossly intact. Moves all 4 extremities w/o difficulty. Affect pleasant.  ASSESSMENT & PLAN:  1) Chronic systolic HF: Nonischemic cardiomyopathy s/p [employer] ICD. Narrow QRS so not CRT candidate.  I reviewed today's echo, EF 30-35% with diffuse hypokinesis.  NYHA I-II symptoms and volume status stable.  - Will continue Lasix 40 mg every other day.   - On goal dose coreg 25 mg BID. - Continue Entresto to 49-51 mg BID. Will check BMET today.  - Increase spironolactone to 25 mg daily (still taking 12.5 mg daily). Check BMET in 7-10 days.  - Reinforced the need and importance of daily weights, a low sodium diet, and fluid restriction (less than 2 L a day). Instructed to call the HF clinic if weight increases more than 3 lbs overnight or 5 lbs in a week.  2) Non-obstructive CAD: No chest pain. Continue  statin. Will check lipids today.  3) Atrial fibrillation: Paroxysmal, NSR today.  He is now on apixaban.  CBC today.  4) ?OSA: Now has Medicaid, will arrange sleep study.   5) Polysubstance abuse: Has been drug free since 06/2013. Keep up the good work.  6) Stroke: In 5/15, likely related to atrial fibrillation. Now on Eliquis.   Loralie Champagne 07/02/2014

## 2014-07-08 NOTE — Anesthesia Postprocedure Evaluation (Signed)
  Anesthesia Post-op Note  Patient: Joseph [name]  Procedure(s) Performed: Procedure(s) (LRB): COLONOSCOPY WITH PROPOFOL (N/A)  Patient Location: PACU  Anesthesia Type: MAC  Level of Consciousness: awake and alert   Airway and Oxygen Therapy: Patient Spontanous Breathing  Post-op Pain: mild  Post-op Assessment: Post-op Vital signs reviewed, Patient's Cardiovascular Status Stable, Respiratory Function Stable, Patent Airway and No signs of Nausea or vomiting  Last Vitals:  Filed Vitals:   07/08/14 0910  BP: 129/80  Pulse: 87  Resp: 22    Post-op Vital Signs: stable   Complications: No apparent anesthesia complications

## 2014-07-11 ENCOUNTER — Encounter (HOSPITAL_COMMUNITY): Payer: Self-pay | Admitting: Gastroenterology

## 2014-07-12 ENCOUNTER — Encounter: Payer: Self-pay | Admitting: Cardiology

## 2014-07-13 ENCOUNTER — Encounter (HOSPITAL_COMMUNITY): Payer: Medicaid Other

## 2014-07-13 ENCOUNTER — Encounter: Payer: Self-pay | Admitting: Internal Medicine

## 2014-07-13 ENCOUNTER — Other Ambulatory Visit (HOSPITAL_COMMUNITY): Payer: Self-pay | Admitting: *Deleted

## 2014-07-13 MED ORDER — ATORVASTATIN CALCIUM 80 MG PO TABS: 80.0000 mg | ORAL_TABLET | Freq: Every evening | ORAL | Status: DC

## 2014-08-09 ENCOUNTER — Other Ambulatory Visit: Payer: Self-pay | Admitting: *Deleted

## 2014-08-09 DIAGNOSIS — IMO0002 Reserved for concepts with insufficient information to code with codable children: Secondary | ICD-10-CM

## 2014-08-09 DIAGNOSIS — E1165 Type 2 diabetes mellitus with hyperglycemia: Secondary | ICD-10-CM

## 2014-08-09 MED ORDER — INSULIN GLARGINE 100 UNIT/ML ~~LOC~~ SOLN: 30.0000 [IU] | Freq: Every morning | SUBCUTANEOUS | Status: DC

## 2014-08-10 ENCOUNTER — Other Ambulatory Visit (HOSPITAL_COMMUNITY): Payer: Self-pay | Admitting: *Deleted

## 2014-08-10 MED ORDER — SPIRONOLACTONE 25 MG PO TABS: 25.0000 mg | ORAL_TABLET | Freq: Every morning | ORAL | Status: DC

## 2014-08-14 ENCOUNTER — Ambulatory Visit (HOSPITAL_BASED_OUTPATIENT_CLINIC_OR_DEPARTMENT_OTHER): Payer: Medicaid Other | Attending: Cardiology | Admitting: Radiology

## 2014-08-14 VITALS — Ht 65.0 in | Wt 190.0 lb

## 2014-08-14 DIAGNOSIS — I493 Ventricular premature depolarization: Secondary | ICD-10-CM | POA: Diagnosis not present

## 2014-08-14 DIAGNOSIS — R0683 Snoring: Secondary | ICD-10-CM | POA: Insufficient documentation

## 2014-08-14 DIAGNOSIS — G4733 Obstructive sleep apnea (adult) (pediatric): Secondary | ICD-10-CM | POA: Diagnosis not present

## 2014-08-14 DIAGNOSIS — I10 Essential (primary) hypertension: Secondary | ICD-10-CM

## 2014-08-14 DIAGNOSIS — G4710 Hypersomnia, unspecified: Secondary | ICD-10-CM | POA: Diagnosis present

## 2014-08-20 ENCOUNTER — Telehealth: Payer: Self-pay | Admitting: Cardiology

## 2014-08-20 NOTE — Addendum Note (Signed)
Addended by: Fransico Him R on: 08/20/2014 04:40 PM   Modules accepted: Orders

## 2014-08-20 NOTE — Telephone Encounter (Signed)
Please let patient know that they have significant sleep apnea and had successful CPAP titration and will be set up with CPAP unit.  Please let DME know that order is in EPIC.  Please set patient up for OV in 10 weeks 

## 2014-08-20 NOTE — Sleep Study (Addendum)
NAME: Joseph [name] DATE OF BIRTH:  1965/11/05 MEDICAL RECORD NUMBER [id number]  LOCATION: [facility]  PHYSICIAN: Sharyon Peitz R  DATE OF STUDY: 08/14/2014  SLEEP STUDY TYPE: Nocturnal Polysomnogram               REFERRING PHYSICIAN: Larey Dresser, MD  INDICATION FOR STUDY: witnessed apnea, snoring, excessive daytime sleepiness  EPWORTH SLEEPINESS SCORE: 7 HEIGHT: 5\' 5"  (165.1 cm)  WEIGHT: 190 lb (86.183 kg)    Body mass index is 31.62 kg/(m^2).  NECK SIZE: 16.5 in.  MEDICATIONS: Reviewed in the chart  SLEEP ARCHITECTURE: During the diagnostic portion of the study, the patient slept for a total of 123 minutes out of a total sleep period time of 137 minutes.  There was no slow wave sleep and 11 minutes of REM sleep.  The onset to sleep latency was prolonged at 68 minutes.  The onset to REM sleep latency was short at 62 minutes.  The sleep efficiency was reduced at 60%.  During the CPAP titration, the patient slept for a total of 120 minutes out of a total sleep period time of 126 minutes.  There was no slow wave sleep and 37 minutes of REM sleep.  The onset to sleep latency was prolonged at 38 minutes and the onset to REM sleep latency was short at 16 minutes.  The sleep efficiency was reduced at 73%.  RESPIRATORY DATA: During the diagnostic portion of the study, there were 58 apneas, of which, 55 were obstructive, 1 central and 2 mixed.  The overall AHI was 56 events per hour consistent with severe obstructive sleep apnea/hypopnea syndrome.  Most events occurred during REM sleep in the supine position.  There was mild to moderate snoring noted.  The patient was started on CPAP at 5cm H2O and titrated for respiratory events and snoring to 11cm H2O.  The patient was able to achieve an optimum pressure of 11cm H2O during REM sleep but unable to maintain the supine position.  The AHI was 2.5 events per hour.    OXYGEN DATA: During the diagnostic portion of the study,  the average oxygen saturation was 93% and the lowest oxygen saturation was 83%.  During the CPAP titration the lowest oxygen saturation was 90% and the average oxygen saturation was 95%.    CARDIAC DATA: The patient maintained NSR with PVC's during the study. The average heart rate was 85-91 bpm.  The lowest heart rate was 42 bpm.    MOVEMENT/PARASOMNIA: There were no periodic limb movements or REM sleep behavior disorders noted.   IMPRESSION/ RECOMMENDATION:   1.  Severe obstructive sleep apnea/hypopnea syndrome with an AHI of 56 events per hour.   Most events occurred during REM sleep in the supine position.  2.  Reduced sleep efficiency with increased frequency of arousals due to respiratory events.   3.  Mild to moderate snoring was noted. 4.  Oxygen desaturations as low as 83% were noted with respiratory events.  During the CPAP titration the lowest oxygen saturation was 90% and the average oxygen saturation was 95%. 5.  There were PVC's noted throughout the study.   6.  Successful CPAP titration to 11cm H2O.  The patient was able to achieve an optimum pressure of 11cm H2O during REM sleep but unable to maintain the supine position.  The AHI was 2.5 events per hour. 7.  Recommend ResMed CPAP at 11cm H2O and C flex of 3, heated humidity and small ResMed Airfit  F 10 full face mask. 8.  Treatment would also include careful attention to proper sleep hygiene, weight reduction for elevated BMI, avoidance of sleeping in the supine position and avoidance of alcohol within four hours of bedtime.  Specific treatment decisions should be tailored to each patient based upon the clinical situation and all treatment options should be considered.  The patient should be instructed to avoid driving if sleepy and careful clinical follow up is needed to ensure that the patient's symptoms are improving with therapy and the PAP adherence is supported and measured if prescribed.    [place], American  Board of Sleep Medicine  ELECTRONICALLY SIGNED ON:  08/20/2014, 4:25 PM [place] PH: [phone]   FX: [phone] [place]

## 2014-08-22 ENCOUNTER — Encounter: Payer: Self-pay | Admitting: Family Medicine

## 2014-08-22 DIAGNOSIS — G4733 Obstructive sleep apnea (adult) (pediatric): Secondary | ICD-10-CM | POA: Insufficient documentation

## 2014-08-22 DIAGNOSIS — Z9989 Dependence on other enabling machines and devices: Secondary | ICD-10-CM

## 2014-08-22 NOTE — Telephone Encounter (Signed)
Left message for patient to call.

## 2014-08-22 NOTE — Telephone Encounter (Signed)
Patient is aware of results. [facility] has been notified of orders.  When patient gets his machine I will schedule 10 week OV

## 2014-08-26 ENCOUNTER — Telehealth: Payer: Self-pay | Admitting: *Deleted

## 2014-08-26 NOTE — Telephone Encounter (Signed)
Patient called stating that he is moving to [facility] and will be finding a doctor there to follow his sleep apnea.  He stated that he will call me back with more information as he gets it and knows who is provider is going to be.

## 2014-09-01 ENCOUNTER — Encounter (HOSPITAL_BASED_OUTPATIENT_CLINIC_OR_DEPARTMENT_OTHER): Payer: Medicaid Other

## 2014-09-14 ENCOUNTER — Ambulatory Visit (INDEPENDENT_AMBULATORY_CARE_PROVIDER_SITE_OTHER): Payer: Medicaid Other | Admitting: *Deleted

## 2014-09-14 ENCOUNTER — Telehealth: Payer: Self-pay | Admitting: Cardiology

## 2014-09-14 DIAGNOSIS — I429 Cardiomyopathy, unspecified: Secondary | ICD-10-CM | POA: Diagnosis not present

## 2014-09-14 DIAGNOSIS — I428 Other cardiomyopathies: Secondary | ICD-10-CM

## 2014-09-14 NOTE — Telephone Encounter (Signed)
Attempted to confirm remote transmission with pt. No answer and was unable to leave a message.   

## 2014-09-14 NOTE — Progress Notes (Signed)
Remote ICD transmission.   

## 2014-09-15 ENCOUNTER — Encounter (HOSPITAL_BASED_OUTPATIENT_CLINIC_OR_DEPARTMENT_OTHER): Payer: Medicaid Other

## 2014-09-20 LAB — CUP PACEART REMOTE DEVICE CHECK
Battery Remaining Longevity: 144 mo
Battery Remaining Percentage: 100 %
Brady Statistic RV Percent Paced: 0 %
HighPow Impedance: 67 Ohm
Lead Channel Setting Pacing Amplitude: 2.4 V
Lead Channel Setting Pacing Pulse Width: 0.4 ms
Lead Channel Setting Sensing Sensitivity: 0.6 mV
MDC IDC MSMT LEADCHNL RV IMPEDANCE VALUE: 405 Ohm
MDC IDC SESS DTM: 20160713182500
MDC IDC SET ZONE DETECTION INTERVAL: 250 ms
Pulse Gen Serial Number: 191604
Zone Setting Detection Interval: 300 ms

## 2014-10-12 ENCOUNTER — Encounter: Payer: Self-pay | Admitting: Cardiology

## 2014-10-18 ENCOUNTER — Encounter: Payer: Self-pay | Admitting: Internal Medicine

## 2014-10-24 ENCOUNTER — Other Ambulatory Visit (HOSPITAL_COMMUNITY): Payer: Self-pay | Admitting: *Deleted

## 2014-10-24 MED ORDER — ATORVASTATIN CALCIUM 80 MG PO TABS: 80.0000 mg | ORAL_TABLET | Freq: Every evening | ORAL | Status: DC

## 2014-11-02 ENCOUNTER — Other Ambulatory Visit: Payer: Self-pay

## 2014-11-02 MED ORDER — APIXABAN 5 MG PO TABS: 5.0000 mg | ORAL_TABLET | Freq: Two times a day (BID) | ORAL | Status: DC

## 2014-11-09 ENCOUNTER — Other Ambulatory Visit: Payer: Self-pay | Admitting: *Deleted

## 2014-11-09 ENCOUNTER — Other Ambulatory Visit (HOSPITAL_COMMUNITY): Payer: Self-pay | Admitting: *Deleted

## 2014-11-09 ENCOUNTER — Encounter: Payer: Self-pay | Admitting: Cardiology

## 2014-11-09 MED ORDER — FUROSEMIDE 40 MG PO TABS: 40.0000 mg | ORAL_TABLET | ORAL | Status: DC

## 2014-11-09 MED ORDER — METFORMIN HCL 500 MG PO TABS: ORAL_TABLET | ORAL | Status: DC

## 2014-11-09 MED ORDER — CARVEDILOL 25 MG PO TABS: ORAL_TABLET | ORAL | Status: DC

## 2014-11-09 MED ORDER — SPIRONOLACTONE 25 MG PO TABS: 25.0000 mg | ORAL_TABLET | Freq: Every morning | ORAL | Status: DC

## 2014-11-09 NOTE — Telephone Encounter (Signed)
Tried calling patient regarding refill for metformin and need for follow.  Phone number listed is no longer valid. Derl Barrow, RN

## 2014-11-09 NOTE — Telephone Encounter (Signed)
Please call - needs PCP visit prior to additional metformin refills. 30 day supply given today

## 2014-11-11 ENCOUNTER — Encounter: Payer: Self-pay | Admitting: *Deleted

## 2014-12-09 ENCOUNTER — Other Ambulatory Visit: Payer: Self-pay | Admitting: *Deleted

## 2014-12-09 MED ORDER — METFORMIN HCL 500 MG PO TABS: ORAL_TABLET | ORAL | Status: DC

## 2014-12-09 NOTE — Telephone Encounter (Signed)
Mailed letter to pt today for FU appt with PCP. Cordero Surette, CMA.

## 2014-12-09 NOTE — Telephone Encounter (Signed)
Please call. Needs appointment to discuss diabetes. I have refilled metformin medication for 2 month but needs to be seen prior to additional refills.

## 2014-12-21 ENCOUNTER — Telehealth: Payer: Self-pay | Admitting: Internal Medicine

## 2014-12-21 NOTE — Telephone Encounter (Signed)
Will forward to Dr. Caryl Comes to review. Per Domingo Mend is the patient's caregiver.

## 2014-12-21 NOTE — Telephone Encounter (Signed)
New Message    Deneen Harts is calling because the pt has relocated to [facility]  And they need a referral to a cardiologist in that area

## 2014-12-27 NOTE — Telephone Encounter (Signed)
Thank you for the information. I do not know any cardiologist in [place]. We'll be glad to send information as soon as they can use a contact

## 2014-12-28 NOTE — Telephone Encounter (Signed)
I left a message for the patient to call. 

## 2014-12-30 NOTE — Telephone Encounter (Signed)
Deneen Harts (caregiver) returning Heather's call.  Advised that Dr. Caryl Comes did not know any Cardiologist in [place] but when they find a physician to notify us of name and phone number and will be glad to send records.  She states he does have a PCP in [place] and advised her to ask him for recommendations.  She verbalizes understanding and will call us back with information.

## 2014-12-30 NOTE — Telephone Encounter (Signed)
f/u Pt returning [name] phone call. Please call back and discuss

## 2014-12-30 NOTE — Telephone Encounter (Signed)
Lm to call back

## 2015-01-03 ENCOUNTER — Encounter: Payer: Self-pay | Admitting: *Deleted

## 2015-02-10 ENCOUNTER — Other Ambulatory Visit: Payer: Self-pay | Admitting: *Deleted

## 2015-02-15 MED ORDER — METFORMIN HCL 500 MG PO TABS: ORAL_TABLET | ORAL | Status: DC

## 2015-02-15 MED ORDER — INSULIN GLARGINE 100 UNIT/ML ~~LOC~~ SOLN: 30.0000 [IU] | Freq: Every morning | SUBCUTANEOUS | Status: DC

## 2015-02-15 NOTE — Telephone Encounter (Signed)
Please call. Needs appointment to discuss diabetes. I have refilled his lantus and Metformin medication for 1 month but needs to be seen prior to additional refills.

## 2015-02-20 ENCOUNTER — Ambulatory Visit (INDEPENDENT_AMBULATORY_CARE_PROVIDER_SITE_OTHER): Payer: Medicaid Other | Admitting: *Deleted

## 2015-02-20 DIAGNOSIS — I429 Cardiomyopathy, unspecified: Secondary | ICD-10-CM

## 2015-02-20 DIAGNOSIS — I428 Other cardiomyopathies: Secondary | ICD-10-CM

## 2015-02-21 NOTE — Progress Notes (Signed)
Remote ICD transmission.   

## 2015-02-22 ENCOUNTER — Other Ambulatory Visit (HOSPITAL_COMMUNITY): Payer: Self-pay | Admitting: Cardiology

## 2015-02-22 MED ORDER — SPIRONOLACTONE 25 MG PO TABS: 25.0000 mg | ORAL_TABLET | Freq: Every morning | ORAL | Status: DC

## 2015-02-23 ENCOUNTER — Encounter: Payer: Self-pay | Admitting: Cardiology

## 2015-02-23 LAB — CUP PACEART REMOTE DEVICE CHECK
Battery Remaining Percentage: 100 %
Brady Statistic RV Percent Paced: 0 %
Date Time Interrogation Session: 20161220035100
HIGH POWER IMPEDANCE MEASURED VALUE: 73 Ohm
Implantable Lead Location: 753860
Implantable Lead Model: 180
Implantable Lead Serial Number: 311831
Lead Channel Impedance Value: 423 Ohm
Lead Channel Sensing Intrinsic Amplitude: 16.4 mV
Lead Channel Setting Pacing Amplitude: 2.4 V
Lead Channel Setting Pacing Pulse Width: 0.4 ms
Lead Channel Setting Sensing Sensitivity: 0.6 mV
MDC IDC LEAD IMPLANT DT: 20150930
MDC IDC MSMT BATTERY REMAINING LONGEVITY: 144 mo
Pulse Gen Serial Number: 191604

## 2015-03-14 ENCOUNTER — Other Ambulatory Visit: Payer: Self-pay | Admitting: *Deleted

## 2015-03-15 ENCOUNTER — Other Ambulatory Visit (HOSPITAL_COMMUNITY): Payer: Self-pay | Admitting: *Deleted

## 2015-03-15 MED ORDER — FUROSEMIDE 40 MG PO TABS: 40.0000 mg | ORAL_TABLET | ORAL | Status: DC

## 2015-04-12 ENCOUNTER — Other Ambulatory Visit: Payer: Self-pay | Admitting: *Deleted

## 2015-04-14 MED ORDER — METFORMIN HCL 500 MG PO TABS: ORAL_TABLET | ORAL | Status: DC

## 2015-04-14 NOTE — Telephone Encounter (Signed)
Please call. Needs appointment to discuss his diabetes. I have refilled his metformin medication for 1 month but needs to be seen prior to additional refills.

## 2015-04-14 NOTE — Telephone Encounter (Signed)
Tried calling patient but number listed is incorrect and address in chart has returned mail before. Jheremy Boger,CMA

## 2015-04-25 ENCOUNTER — Telehealth: Payer: Self-pay | Admitting: *Deleted

## 2015-04-25 NOTE — Telephone Encounter (Signed)
There is no contact number listed for patient and no answer at emergency contact number. Will make second attempt to reach emergency contact later

## 2015-05-10 ENCOUNTER — Other Ambulatory Visit (HOSPITAL_COMMUNITY): Payer: Self-pay | Admitting: *Deleted

## 2015-05-10 MED ORDER — CARVEDILOL 25 MG PO TABS: ORAL_TABLET | ORAL | Status: DC

## 2015-05-22 ENCOUNTER — Other Ambulatory Visit (HOSPITAL_COMMUNITY): Payer: Self-pay | Admitting: *Deleted

## 2015-05-22 IMAGING — CR DG CHEST 2V
2 series · 2 of 2 positions shown · non-contrast
Comparison: 06/28/2013

CLINICAL DATA: Post implant, history hypertension, CHF, type 2
diabetes, coronary artery disease, non ischemic cardiomyopathy, CHF

EXAM:
CHEST  2 VIEW

[w chest pa]
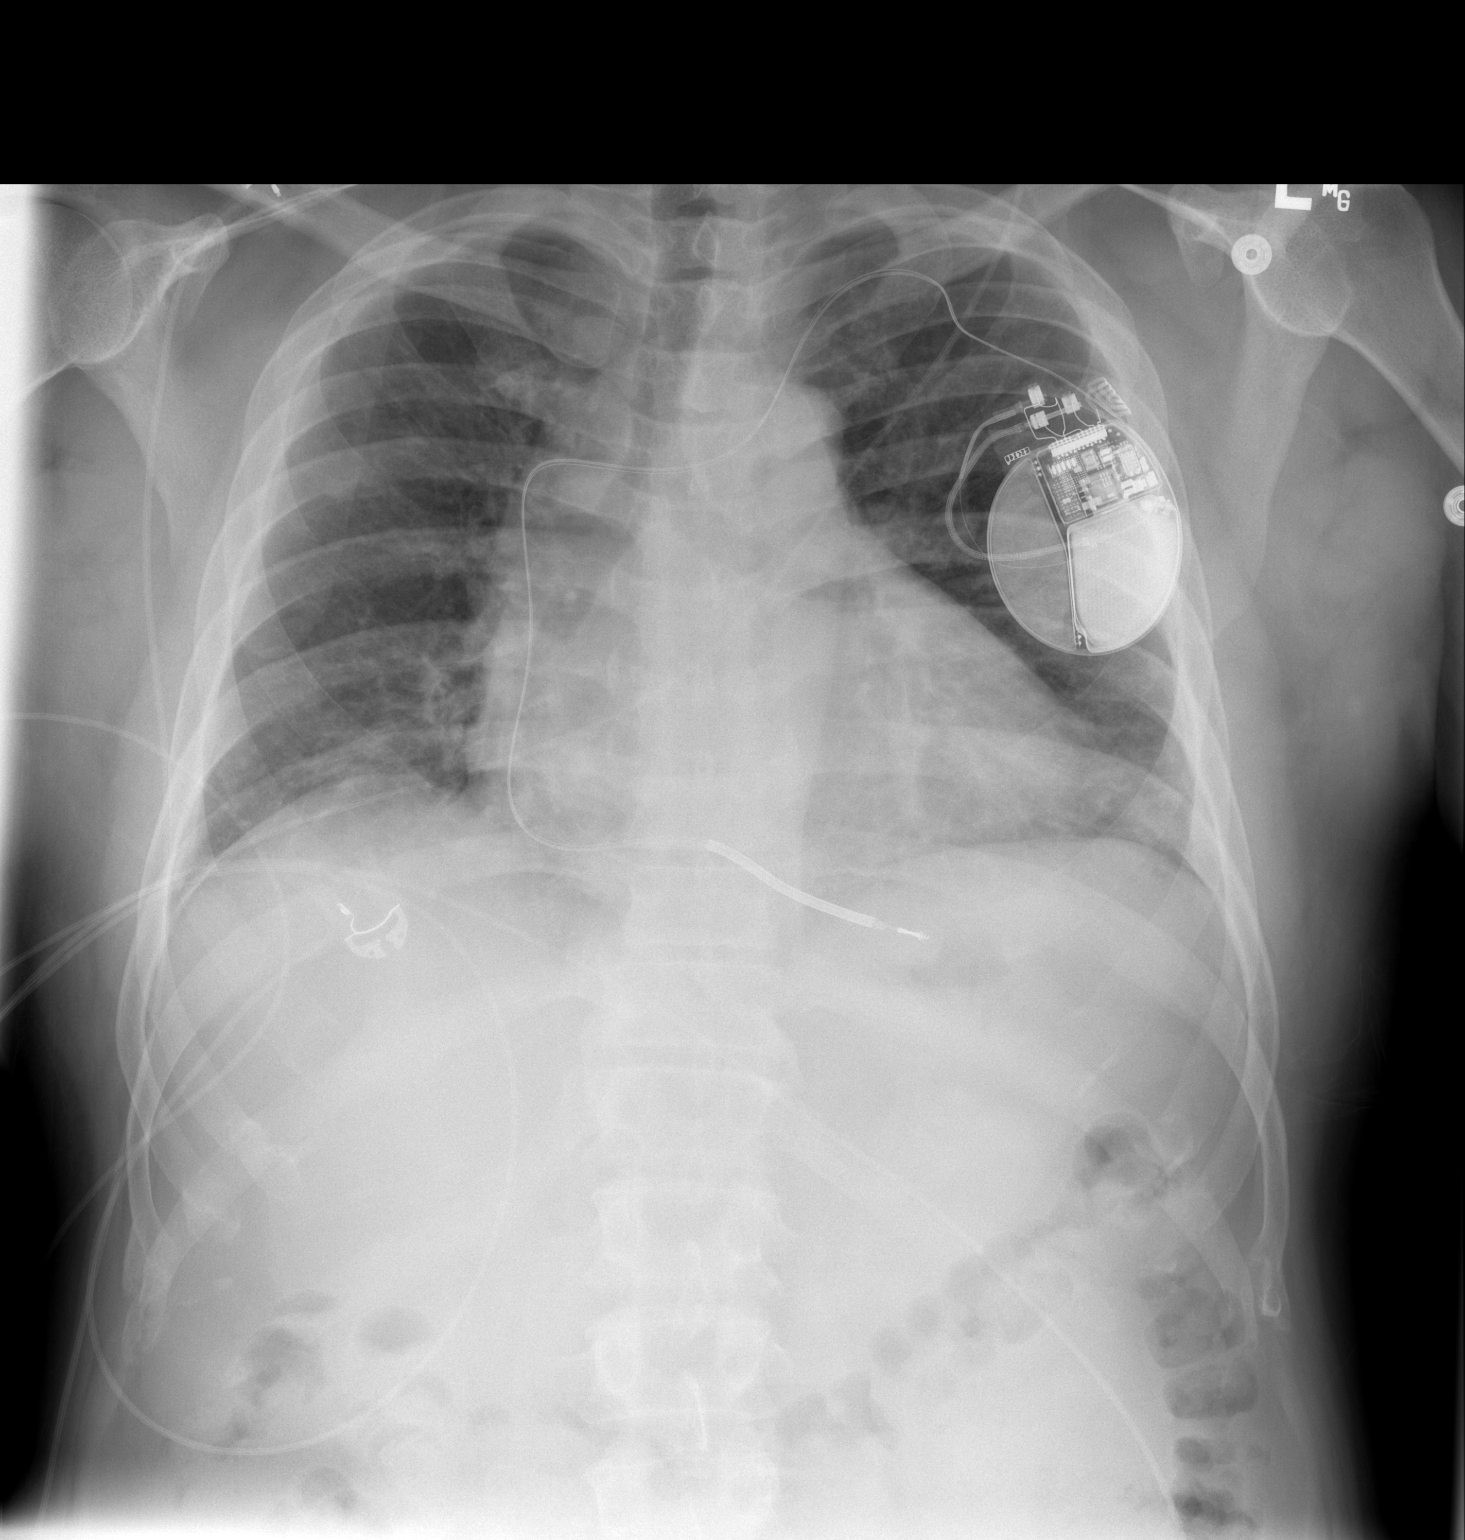

[w chest lat]
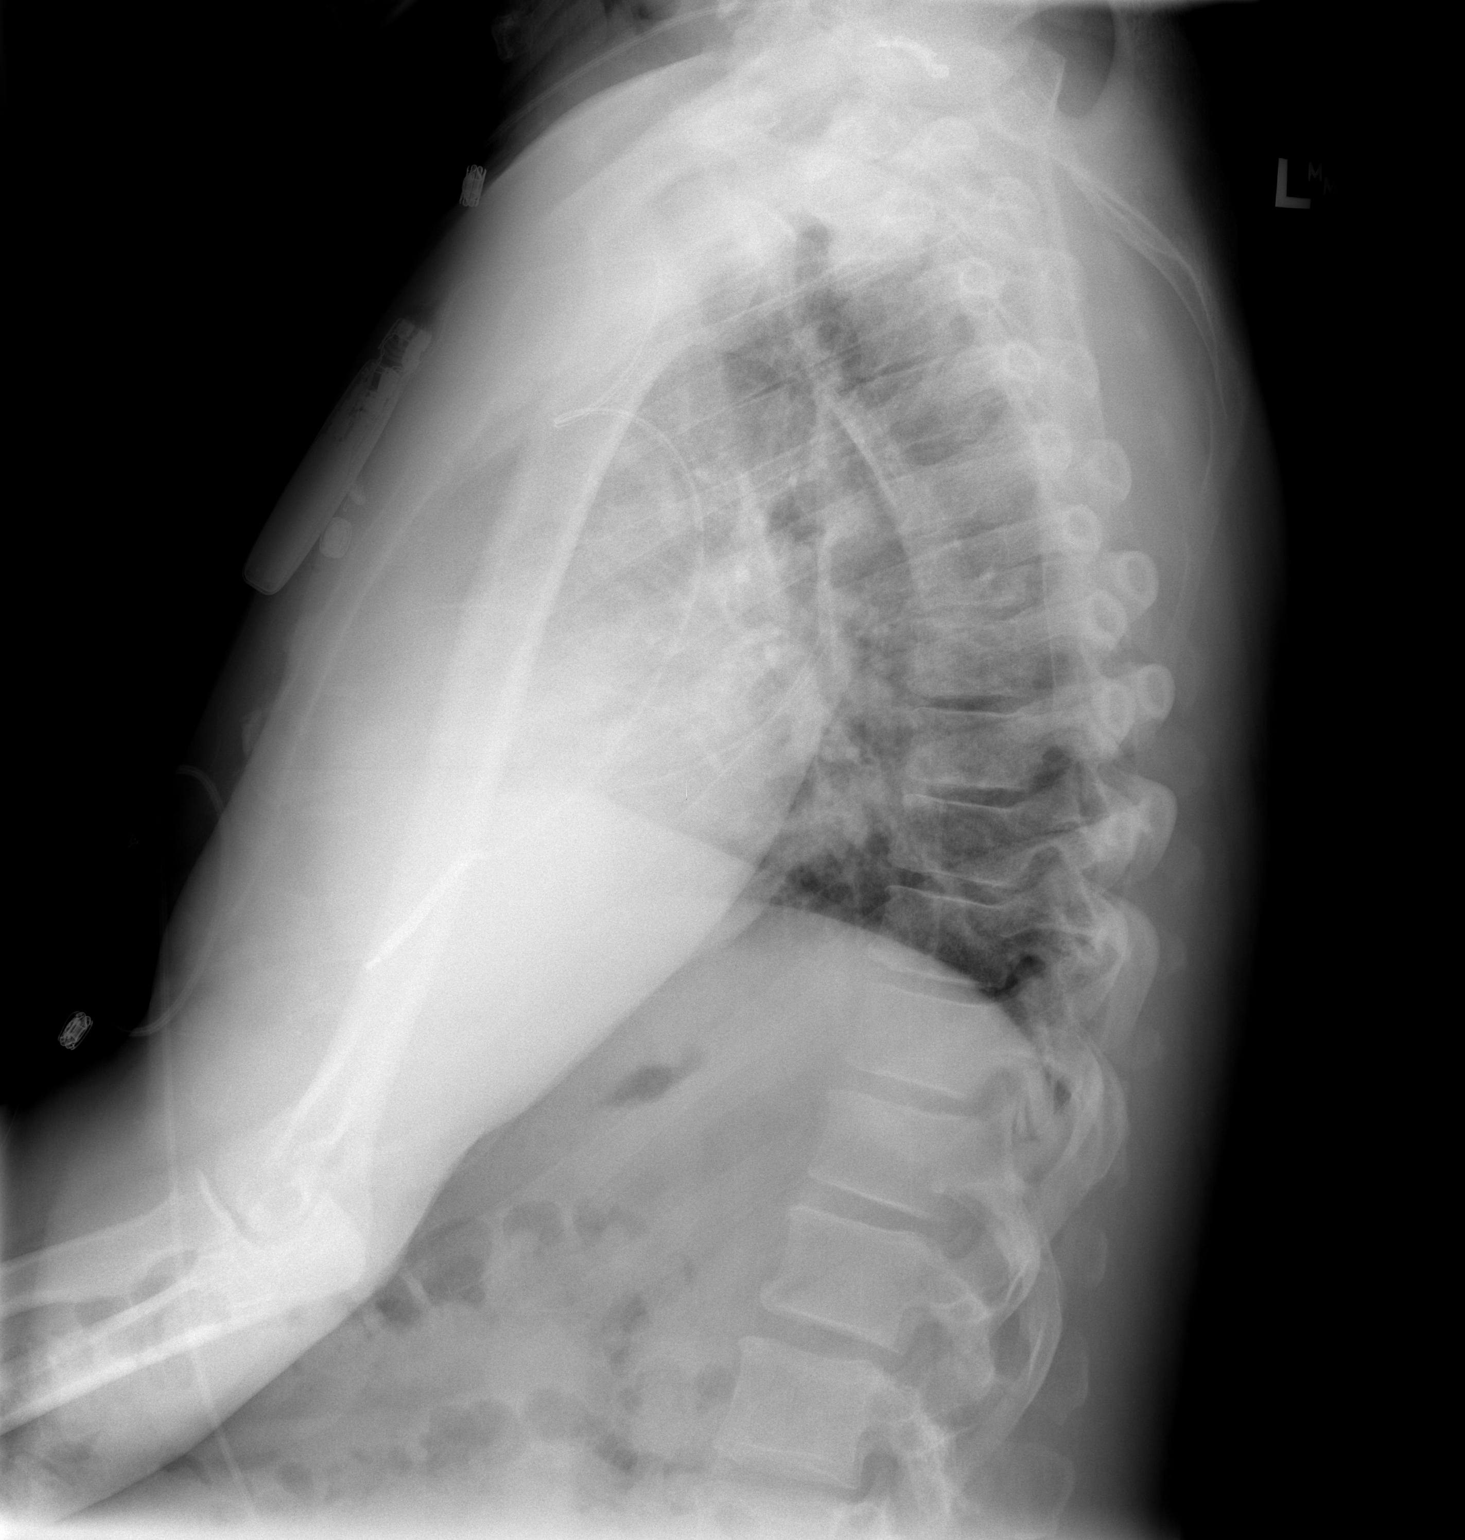

[2 of 2 positions shown; findings below may reference images not displayed]

FINDINGS: New LEFT subclavian AICD with lead projecting at RIGHT ventricle.

Enlargement of cardiac silhouette.

Mediastinal contours and pulmonary vascularity normal.

Minimal bibasilar atelectasis.

Lungs otherwise clear.

No pleural effusion or pneumothorax.

Bones unremarkable.
IMPRESSION: Enlargement of cardiac silhouette post AICD placement.

Minimal bibasilar atelectasis.

## 2015-05-22 MED ORDER — SPIRONOLACTONE 25 MG PO TABS: 25.0000 mg | ORAL_TABLET | Freq: Every morning | ORAL | Status: DC

## 2015-06-07 ENCOUNTER — Telehealth: Payer: Self-pay | Admitting: *Deleted

## 2015-06-07 NOTE — Telephone Encounter (Signed)
Joseph [name] from [facility] called to request that patient be released in [place].  Patient released for pick up by new clinic.

## 2015-08-07 ENCOUNTER — Other Ambulatory Visit (HOSPITAL_COMMUNITY): Payer: Self-pay | Admitting: *Deleted

## 2015-08-07 MED ORDER — SPIRONOLACTONE 25 MG PO TABS: 25.0000 mg | ORAL_TABLET | Freq: Every morning | ORAL | Status: DC

## 2015-09-02 ENCOUNTER — Emergency Department (HOSPITAL_COMMUNITY)
Admission: EM | Admit: 2015-09-02 | Discharge: 2015-09-03 | Disposition: A | Discharge status: Home / Self Care | Payer: Medicaid Other | Attending: Emergency Medicine | Admitting: Emergency Medicine

## 2015-09-02 ENCOUNTER — Emergency Department (HOSPITAL_COMMUNITY): Payer: Medicaid Other

## 2015-09-02 DIAGNOSIS — R55 Syncope and collapse: Secondary | ICD-10-CM | POA: Diagnosis present

## 2015-09-02 DIAGNOSIS — Z7984 Long term (current) use of oral hypoglycemic drugs: Secondary | ICD-10-CM | POA: Insufficient documentation

## 2015-09-02 DIAGNOSIS — Z794 Long term (current) use of insulin: Secondary | ICD-10-CM | POA: Insufficient documentation

## 2015-09-02 DIAGNOSIS — F10920 Alcohol use, unspecified with intoxication, uncomplicated: Secondary | ICD-10-CM

## 2015-09-02 DIAGNOSIS — F10220 Alcohol dependence with intoxication, uncomplicated: Secondary | ICD-10-CM | POA: Diagnosis not present

## 2015-09-02 DIAGNOSIS — F17210 Nicotine dependence, cigarettes, uncomplicated: Secondary | ICD-10-CM | POA: Diagnosis not present

## 2015-09-02 DIAGNOSIS — Z95810 Presence of automatic (implantable) cardiac defibrillator: Secondary | ICD-10-CM | POA: Diagnosis not present

## 2015-09-02 DIAGNOSIS — I2510 Atherosclerotic heart disease of native coronary artery without angina pectoris: Secondary | ICD-10-CM | POA: Diagnosis not present

## 2015-09-02 DIAGNOSIS — I509 Heart failure, unspecified: Secondary | ICD-10-CM | POA: Insufficient documentation

## 2015-09-02 DIAGNOSIS — Z7901 Long term (current) use of anticoagulants: Secondary | ICD-10-CM | POA: Insufficient documentation

## 2015-09-02 DIAGNOSIS — Z8673 Personal history of transient ischemic attack (TIA), and cerebral infarction without residual deficits: Secondary | ICD-10-CM | POA: Diagnosis not present

## 2015-09-02 DIAGNOSIS — E860 Dehydration: Secondary | ICD-10-CM

## 2015-09-02 DIAGNOSIS — E119 Type 2 diabetes mellitus without complications: Secondary | ICD-10-CM | POA: Diagnosis not present

## 2015-09-02 DIAGNOSIS — I110 Hypertensive heart disease with heart failure: Secondary | ICD-10-CM | POA: Diagnosis not present

## 2015-09-02 DIAGNOSIS — Z79899 Other long term (current) drug therapy: Secondary | ICD-10-CM | POA: Diagnosis not present

## 2015-09-02 LAB — BASIC METABOLIC PANEL
ANION GAP: 14 (ref 5–15)
BUN: 18 mg/dL (ref 6–20)
CHLORIDE: 107 mmol/L (ref 101–111)
CO2: 17 mmol/L — AB (ref 22–32)
Calcium: 8.8 mg/dL — ABNORMAL LOW (ref 8.9–10.3)
Creatinine, Ser: 1.28 mg/dL — ABNORMAL HIGH (ref 0.61–1.24)
GFR calc Af Amer: >60 mL/min (ref >60)
GFR calc non Af Amer: >60 mL/min (ref >60)
GLUCOSE: 153 mg/dL — AB (ref 65–99)
POTASSIUM: 3.6 mmol/L (ref 3.5–5.1)
Sodium: 138 mmol/L (ref 135–145)

## 2015-09-02 LAB — CBC
HEMATOCRIT: 41.9 % (ref 39.0–52.0)
Hemoglobin: 13.7 g/dL (ref 13.0–17.0)
MCH: 31.3 pg (ref 26.0–34.0)
MCHC: 32.7 g/dL (ref 30.0–36.0)
MCV: 95.7 fL (ref 78.0–100.0)
Platelets: 195 10*3/uL (ref 150–400)
RBC: 4.38 MIL/uL (ref 4.22–5.81)
RDW: 13 % (ref 11.5–15.5)
WBC: 12.3 10*3/uL — ABNORMAL HIGH (ref 4.0–10.5)

## 2015-09-02 LAB — ETHANOL: ALCOHOL ETHYL (B): 223 mg/dL — AB (ref <5)

## 2015-09-02 LAB — TROPONIN I: Troponin I: <0.03 ng/mL (ref <0.03)

## 2015-09-02 LAB — D-DIMER, QUANTITATIVE: D-Dimer, Quant: <0.27 ug/mL-FEU (ref 0.00–0.50)

## 2015-09-02 MED ORDER — SODIUM CHLORIDE 0.9 % IV SOLN
INTRAVENOUS | Status: DC
  Administered 2015-09-02: 22:00:00 via INTRAVENOUS

## 2015-09-02 MED ORDER — SODIUM CHLORIDE 0.9 % IV BOLUS (SEPSIS)
1000.0000 mL | Freq: Once | INTRAVENOUS | Status: AC
  Administered 2015-09-02: 1000 mL via INTRAVENOUS

## 2015-09-02 NOTE — ED Notes (Signed)
Per [facility], pt had one beer today and became dizzy, felt like he was going to pass out, slid down onto the floor and passed out for 2-3 minutes. Pt states he doesn't feel right. Neuro intact. CBG 129. BP 90/56 initial, 100/50 last BP. AAOX4, denies pain.

## 2015-09-02 NOTE — ED Provider Notes (Signed)
CSN: [id number]     Arrival date & time 09/02/15  2122 History   First MD Initiated Contact with Patient 09/02/15 2125     Chief Complaint  Patient presents with  . Loss of Consciousness   PT IS A 50 YO BM WITH A HX OF A.FIB ([place] VASC SCORE 6) ON ELIQUIS WHO PASSED OUT PTA.  THE PT WAS TALKING TO HIS FAMILY, BECAME DIZZY, AND PASSED OUT.  PT HAS A SCRAPE ON HIS NOSE, BUT FAMILY IS UNSURE IF HE HIT HIS HEAD.  PT DENIES ANY PAIN.  HE DID TELL EMS THAT HE HAD 1 BEER TONIGHT.  EMS SAID PT'S SBP WAS 90.  (Consider location/radiation/quality/duration/timing/severity/associated sxs/prior Treatment) Patient is a 50 y.o. male presenting with syncope. The history is provided by the patient.  Loss of Consciousness Episode history:  Single Most recent episode:  Today Progression:  Improving Witnessed: yes     Past Medical History  Diagnosis Date  . Hypertension   . Shortness of breath   . High cholesterol   . CHF (congestive heart failure)   . Pneumonia ?2004  . Type II diabetes mellitus   . CAD (coronary artery disease)     a. 06/28/13 non obst dz. managed medically  . Non-ischemic cardiomyopathy ([place])     a. 06/28/13 ECHO: EF 25% and global hypokinesis and cath wuth EF 15%   . Automatic implantable cardioverter-defibrillator in situ 12/01/2013    SINGLE CHAMBER BY DR Olin Pia  . Stroke    Past Surgical History  Procedure Laterality Date  . Finger amputation Right 1987    "reattached middle and ring fingers"  . Tee without cardioversion N/A 07/06/2013    Procedure: TRANSESOPHAGEAL ECHOCARDIOGRAM (TEE);  Surgeon: [name], MD;  Location: [place];  Service: Cardiovascular;  Laterality: N/A;  . Ep implantable device  12/01/2013    single chamber by dr Caryl Comes  . Left heart catheterization with coronary angiogram N/A 06/30/2013    Procedure: LEFT HEART CATHETERIZATION WITH CORONARY ANGIOGRAM;  Surgeon: Troy Sine, MD;  Location: [facility] CATH LAB;  Service: Cardiovascular;   Laterality: N/A;  . Implantable cardioverter defibrillator implant N/A 12/01/2013    Procedure: IMPLANTABLE CARDIOVERTER DEFIBRILLATOR IMPLANT;  Surgeon: Deboraha Sprang, MD;  Location: [facility] CATH LAB;  Service: Cardiovascular;  Laterality: N/A;  . Colonoscopy with propofol N/A 07/08/2014    Procedure: COLONOSCOPY WITH PROPOFOL;  Surgeon: Carol Ada, MD;  Location: [facility];  Service: Endoscopy;  Laterality: N/A;   No family history on file. Social History  Substance Use Topics  . Smoking status: Current Some Day Smoker -- 1.00 packs/day for 31 years    Types: Cigarettes    Last Attempt to Quit: 06/28/2013  . Smokeless tobacco: Never Used     Comment: still smokes occasionally- down to 10 cigs per day  . Alcohol Use: 19.2 oz/week    21 Cans of beer, 11 Shots of liquor per week     Comment: 06/29/2013 "2-3 beers/day; maybe 1 pint/wk"    Review of Systems  Cardiovascular: Positive for syncope.  Neurological: Positive for syncope.  All other systems reviewed and are negative.     Allergies  Review of patient's allergies indicates no known allergies.  Home Medications   Prior to Admission medications   Medication Sig Start Date End Date Taking? Authorizing Provider  apixaban (ELIQUIS) 5 MG TABS tablet Take 1 tablet (5 mg total) by mouth 2 (two) times daily. 11/02/14  Yes Deboraha Sprang, MD  atorvastatin (LIPITOR) 80 MG tablet Take 1 tablet (80 mg total) by mouth every evening. 10/24/14  Yes Larey Dresser, MD  carvedilol (COREG) 25 MG tablet TAKE 1 TABLET BY MOUTH 2 TIMES DAILY WITH A MEAL. 05/10/15  Yes Amy D Clegg, NP  furosemide (LASIX) 40 MG tablet Take 1 tablet (40 mg total) by mouth every other day. Patient taking differently: Take 40 mg by mouth daily.  03/15/15  Yes Amy D Clegg, NP  insulin glargine (LANTUS) 100 UNIT/ML injection Inject 0.3 mLs (30 Units total) into the skin every morning. Patient taking differently: Inject 40 Units into the skin at bedtime.  02/15/15  Yes Olam Idler, MD  metFORMIN (GLUCOPHAGE) 500 MG tablet TAKE 2 TABLETS BY MOUTH 2 TIMES DAILY WITH A MEAL. 04/14/15  Yes Olam Idler, MD  sacubitril-valsartan (ENTRESTO) 49-51 MG Take 1 tablet by mouth 2 (two) times daily. 04/07/14  Yes Jolaine Artist, MD  spironolactone (ALDACTONE) 25 MG tablet Take 1 tablet (25 mg total) by mouth every morning. 08/07/15  Yes Jolaine Artist, MD  Blood Glucose Monitoring Suppl ([place]) [place] Patient will need to test his blood glucose once daily prior to breakfast and metformin and amaryl administration. 07/07/13   Olam Idler, MD  diphenhydrAMINE (BENADRYL) 25 MG tablet Take 1 tablet (25 mg total) by mouth every 6 (six) hours. 06/13/14   Tiffany Carlota Raspberry, PA-C  glucose blood test strip Use as instructed Patient taking differently: 1 each by Other route 3 (three) times a week. Use as instructed 07/07/13   Olam Idler, MD  Insulin Syringes, Disposable, U-100 0.3 ML MISC 1 each by Does not apply route daily. 05/09/14   Olam Idler, MD  ReliOn Ultra Thin Lancets MISC Patient will need to test his blood glucose once daily prior to breakfast and metformin and amaryl administration. 07/07/13   Olam Idler, MD   BP 96/66 mmHg  Pulse 115  Temp(Src) 98 F (36.7 C)  Resp 20  SpO2 93% Physical Exam  Constitutional: He is oriented to person, place, and time. He appears well-developed and well-nourished.  HENT:  Head: Normocephalic and atraumatic.  Right Ear: External ear normal.  Left Ear: External ear normal.  Nose: Nose normal.  Mouth/Throat: Oropharynx is clear and moist.  Eyes: Conjunctivae and EOM are normal. Pupils are equal, round, and reactive to light.  Neck: Normal range of motion. Neck supple.  Cardiovascular: Regular rhythm, normal heart sounds and intact distal pulses.  Tachycardia present.   Pulmonary/Chest: Effort normal and breath sounds normal.  Abdominal: Soft. Bowel sounds are normal.  Musculoskeletal: Normal range of motion.   Neurological: He is alert and oriented to person, place, and time.  Skin: Skin is warm and dry.  Psychiatric: He has a normal mood and affect. Judgment and thought content normal.  Nursing note and vitals reviewed.   ED Course  Procedures (including critical care time) Labs Review Labs Reviewed  BASIC METABOLIC PANEL - Abnormal; Notable for the following:    CO2 17 (*)    Glucose, Bld 153 (*)    Creatinine, Ser 1.28 (*)    Calcium 8.8 (*)    All other components within normal limits  CBC - Abnormal; Notable for the following:    WBC 12.3 (*)    All other components within normal limits  ETHANOL - Abnormal; Notable for the following:    Alcohol, Ethyl (B) 223 (*)    All other components within  normal limits  TROPONIN I  D-DIMER, QUANTITATIVE (NOT AT [facility])  URINALYSIS, ROUTINE W REFLEX MICROSCOPIC (NOT AT [facility])  URINE RAPID DRUG SCREEN, HOSP PERFORMED  POCT CBG (FASTING - GLUCOSE)-MANUAL ENTRY    Imaging Review Dg Chest 2 View  09/02/2015  CLINICAL DATA:  Syncope.  Pacemaker. EXAM: CHEST  2 VIEW COMPARISON:  12/02/2013. FINDINGS: Low lung volumes. The lungs are clear wiithout focal pneumonia, edema, pneumothorax or pleural effusion. Scarring at the left base is unchanged. Cardiopericardial silhouette is at upper limits of normal for size. Left single lead pacer/ AICD remains in place. The visualized bony structures of the thorax are intact. IMPRESSION: Stable.  No acute cardiopulmonary findings. Electronically Signed   By: Misty Stanley M.D.   On: 09/02/2015 22:29   Ct Head Wo Contrast  09/02/2015  CLINICAL DATA:  Syncope.  Anti coagulated. EXAM: CT HEAD WITHOUT CONTRAST TECHNIQUE: Contiguous axial images were obtained from the base of the skull through the vertex without intravenous contrast. COMPARISON:  07/03/2013 FINDINGS: Negative for intracranial hemorrhage, mass or evidence of acute infarction. Encephalomalacia due to remote bifrontal infarctions. Mild generalized atrophy. Mild  white matter hypodensity consistent with small vessel ischemic disease. Visible paranasal sinuses and orbits are unremarkable. IMPRESSION: No acute findings. Remote bilateral frontal infarctions with encephalomalacia. Mild generalized atrophy. Electronically Signed   By: Andreas Newport M.D.   On: 09/02/2015 22:15   I have personally reviewed and evaluated these images and lab results as part of my medical decision-making.   EKG Interpretation   Date/Time:  Saturday September 02 2015 21:29:41 EDT Ventricular Rate:  111 PR Interval:    QRS Duration: 93 QT Interval:  348 QTC Calculation: 473 R Axis:   -64 Text Interpretation:  Sinus tachycardia Inferior infarct, old Consider  anterior infarct Confirmed by Shown Dissinger MD, Shannyn Jankowiak ([id number]) on 09/02/2015  9:37:55 PM      MDM  I THINK SYNCOPE IS DUE TO ORTHOSTASIS, AND NOT FROM A CARDIAC ETIOLOGY.  PT'S BP IMPROVING AFTER IVFS.  PT TOLD TO AVOID ETOH.  HE IS INSTR TO RETURN IF WORSE.  HE IS ALSO TOLD TO HOLD BP MEDS TOMORROW.    Final diagnoses:  Acute alcohol intoxication, uncomplicated ([place])  Vasovagal syncope  Dehydration       Isla Pence, MD 09/03/15 [phone]

## 2015-09-03 LAB — URINALYSIS, ROUTINE W REFLEX MICROSCOPIC
Bilirubin Urine: NEGATIVE
Glucose, UA: >1000 mg/dL — AB
Hgb urine dipstick: NEGATIVE
KETONES UR: NEGATIVE mg/dL
LEUKOCYTES UA: NEGATIVE
NITRITE: NEGATIVE
PROTEIN: NEGATIVE mg/dL
Specific Gravity, Urine: 1.023 (ref 1.005–1.030)
pH: 5 (ref 5.0–8.0)

## 2015-09-03 LAB — URINE MICROSCOPIC-ADD ON

## 2015-09-03 LAB — RAPID URINE DRUG SCREEN, HOSP PERFORMED
Amphetamines: NOT DETECTED
BARBITURATES: NOT DETECTED
BENZODIAZEPINES: NOT DETECTED
COCAINE: NOT DETECTED
Opiates: NOT DETECTED
Tetrahydrocannabinol: NOT DETECTED

## 2015-09-03 NOTE — Discharge Instructions (Signed)
HOLD MEDICATIONS FOR BLOOD PRESSURE TOMORROW IF SBP STAYS UNDER 120.  Alcohol Intoxication Alcohol intoxication occurs when you drink enough alcohol that it affects your ability to function. It can be mild or very severe. Drinking a lot of alcohol in a short time is called binge drinking. This can be very harmful. Drinking alcohol can also be more dangerous if you are taking medicines or other drugs. Some of the effects caused by alcohol may include:  Loss of coordination.  Changes in mood and behavior.  Unclear thinking.  Trouble talking (slurred speech).  Throwing up (vomiting).  Confusion.  Slowed breathing.  Twitching and shaking (seizures).  Loss of consciousness. HOME CARE  Do not drive after drinking alcohol.  Drink enough water and fluids to keep your pee (urine) clear or pale yellow. Avoid caffeine.  Only take medicine as told by your doctor. GET HELP IF:  You throw up (vomit) many times.  You do not feel better after a few days.  You frequently have alcohol intoxication. Your doctor can help decide if you should see a substance use treatment counselor. GET HELP RIGHT AWAY IF:  You become shaky when you stop drinking.  You have twitching and shaking.  You throw up blood. It may look bright red or like coffee grounds.  You notice blood in your poop (bowel movements).  You become lightheaded or pass out (faint). MAKE SURE YOU:   Understand these instructions.  Will watch your condition.  Will get help right away if you are not doing well or get worse.   This information is not intended to replace advice given to you by your health care provider. Make sure you discuss any questions you have with your health care provider.   Document Released: 08/07/2007 Document Revised: 10/21/2012 Document Reviewed: 07/24/2012 Elsevier Interactive Patient Education [employer].

## 2015-10-31 ENCOUNTER — Other Ambulatory Visit (HOSPITAL_COMMUNITY): Payer: Self-pay | Admitting: Adult Health

## 2015-11-03 ENCOUNTER — Other Ambulatory Visit: Payer: Self-pay | Admitting: Cardiology

## 2015-11-30 ENCOUNTER — Other Ambulatory Visit (HOSPITAL_COMMUNITY): Payer: Self-pay | Admitting: Adult Health

## 2015-11-30 ENCOUNTER — Other Ambulatory Visit (HOSPITAL_COMMUNITY): Payer: Self-pay | Admitting: Internal Medicine

## 2015-12-05 ENCOUNTER — Encounter (HOSPITAL_COMMUNITY): Payer: Self-pay

## 2015-12-05 ENCOUNTER — Telehealth (HOSPITAL_COMMUNITY): Payer: Self-pay | Admitting: Vascular Surgery

## 2015-12-05 NOTE — Progress Notes (Signed)
[facility] faxed medical record request for legal case involving patient for all records from our office 06/2013--present. 47 pages total faxed to provided # [phone] Case # [id number] Copy of medical record request scanned into patient's electronic chart.  Renee Pain, RN

## 2015-12-05 NOTE — Telephone Encounter (Signed)
Left pt message to make f/u appt 

## 2015-12-08 ENCOUNTER — Encounter (HOSPITAL_COMMUNITY): Payer: Self-pay | Admitting: Vascular Surgery

## 2015-12-21 DIAGNOSIS — Z736 Limitation of activities due to disability: Secondary | ICD-10-CM

## 2016-01-01 ENCOUNTER — Other Ambulatory Visit (HOSPITAL_COMMUNITY): Payer: Self-pay | Admitting: Adult Health

## 2016-02-01 ENCOUNTER — Other Ambulatory Visit: Payer: Self-pay | Admitting: Cardiology

## 2016-04-04 ENCOUNTER — Other Ambulatory Visit: Payer: Self-pay | Admitting: Internal Medicine

## 2016-04-30 ENCOUNTER — Ambulatory Visit: Payer: Medicaid Other | Admitting: Family Medicine

## 2016-05-03 ENCOUNTER — Other Ambulatory Visit (HOSPITAL_COMMUNITY): Payer: Self-pay | Admitting: Adult Health

## 2016-05-03 ENCOUNTER — Other Ambulatory Visit: Payer: Self-pay | Admitting: Internal Medicine

## 2016-07-04 ENCOUNTER — Ambulatory Visit (HOSPITAL_COMMUNITY)
Admission: RE | Admit: 2016-07-04 | Discharge: 2016-07-04 | Disposition: A | Discharge status: Home / Self Care | Payer: Medicaid Other | Source: Ambulatory Visit | Attending: Cardiology | Admitting: Cardiology

## 2016-07-04 ENCOUNTER — Telehealth (HOSPITAL_COMMUNITY): Payer: Self-pay | Admitting: Student

## 2016-07-04 ENCOUNTER — Encounter (HOSPITAL_COMMUNITY): Payer: Self-pay

## 2016-07-04 VITALS — BP 130/82 | HR 83 | Wt 203.8 lb

## 2016-07-04 DIAGNOSIS — Z833 Family history of diabetes mellitus: Secondary | ICD-10-CM | POA: Diagnosis not present

## 2016-07-04 DIAGNOSIS — I110 Hypertensive heart disease with heart failure: Secondary | ICD-10-CM | POA: Diagnosis not present

## 2016-07-04 DIAGNOSIS — Z95810 Presence of automatic (implantable) cardiac defibrillator: Secondary | ICD-10-CM | POA: Diagnosis not present

## 2016-07-04 DIAGNOSIS — I5022 Chronic systolic (congestive) heart failure: Secondary | ICD-10-CM | POA: Insufficient documentation

## 2016-07-04 DIAGNOSIS — I2510 Atherosclerotic heart disease of native coronary artery without angina pectoris: Secondary | ICD-10-CM | POA: Diagnosis not present

## 2016-07-04 DIAGNOSIS — Z9989 Dependence on other enabling machines and devices: Secondary | ICD-10-CM

## 2016-07-04 DIAGNOSIS — G4733 Obstructive sleep apnea (adult) (pediatric): Secondary | ICD-10-CM | POA: Diagnosis not present

## 2016-07-04 DIAGNOSIS — I639 Cerebral infarction, unspecified: Secondary | ICD-10-CM

## 2016-07-04 DIAGNOSIS — I428 Other cardiomyopathies: Secondary | ICD-10-CM | POA: Insufficient documentation

## 2016-07-04 DIAGNOSIS — E119 Type 2 diabetes mellitus without complications: Secondary | ICD-10-CM | POA: Diagnosis not present

## 2016-07-04 DIAGNOSIS — Z8673 Personal history of transient ischemic attack (TIA), and cerebral infarction without residual deficits: Secondary | ICD-10-CM | POA: Insufficient documentation

## 2016-07-04 DIAGNOSIS — Z7901 Long term (current) use of anticoagulants: Secondary | ICD-10-CM | POA: Diagnosis not present

## 2016-07-04 DIAGNOSIS — Z794 Long term (current) use of insulin: Secondary | ICD-10-CM

## 2016-07-04 DIAGNOSIS — E118 Type 2 diabetes mellitus with unspecified complications: Secondary | ICD-10-CM | POA: Diagnosis not present

## 2016-07-04 DIAGNOSIS — I10 Essential (primary) hypertension: Secondary | ICD-10-CM

## 2016-07-04 DIAGNOSIS — I480 Paroxysmal atrial fibrillation: Secondary | ICD-10-CM | POA: Diagnosis not present

## 2016-07-04 DIAGNOSIS — Z79899 Other long term (current) drug therapy: Secondary | ICD-10-CM | POA: Insufficient documentation

## 2016-07-04 LAB — BASIC METABOLIC PANEL
ANION GAP: 7 (ref 5–15)
BUN: 12 mg/dL (ref 6–20)
CALCIUM: 9 mg/dL (ref 8.9–10.3)
CO2: 24 mmol/L (ref 22–32)
Chloride: 107 mmol/L (ref 101–111)
Creatinine, Ser: 1.05 mg/dL (ref 0.61–1.24)
GLUCOSE: 197 mg/dL — AB (ref 65–99)
POTASSIUM: 4.6 mmol/L (ref 3.5–5.1)
SODIUM: 138 mmol/L (ref 135–145)

## 2016-07-04 MED ORDER — SACUBITRIL-VALSARTAN 97-103 MG PO TABS: 1.0000 | ORAL_TABLET | Freq: Two times a day (BID) | ORAL | 6 refills | Status: DC

## 2016-07-04 NOTE — Patient Instructions (Signed)
Routine lab work today. Will notify you of abnormal results, otherwise no news is good news!  INCREASE Entresto to 97/103 mg twice daily. Can "double up" on 49/51 mg tablets that you have at home (Take 2 tabs twice daily). New Rx has been sent to your pharmacy for 97/103 mg tablets (Take 1 tab twice daily).  Will schedule you for an echocardiogram and labs at [facility] in 2-3 weeks. Address: [address] ([place]), [place], [place] [address]  Phone: [phone]  ______________________________________________________________  ______________________________________________________________  Will refer you to electrophysiology at [facility]. Address: [address] ([place]), [place], [place] [address]  Phone: [phone]  _______________________________________________________________  _______________________________________________________________  Make appointment with [facility] [phone]  Follow up with Dr. Aundra Dubin in 3 months.  ______________________________________________________________  ______________________________________________________________  [place] the following things EVERYDAY: 1) Weigh yourself in the morning before breakfast. Write it down and keep it in a log. 2) Take your medicines as prescribed 3) Eat low salt foods-Limit salt (sodium) to 2000 mg per day.  4) Stay as active as you can everyday 5) Limit all fluids for the day to less than 2 liters

## 2016-07-04 NOTE — Progress Notes (Signed)
Patient ID: Joseph [name], male   DOB: 15-May-1965, 51 y.o.   MRN: [id number]     Advanced Heart Failure Clinic Note   PCP: Dr. Berkley Harvey [facility]) EP: Dr Caryl Comes  HF: Dr. Aundra Dubin   HPI: Joseph [name] is a 51 y.o. male with history of chronic systolic HF, nonischemic cardiomyopathy, HTN, TIA, stroke, DM2, paroxysmal atrial fibrillation tobacco abuse and polysubstance abuse.   R/LHC 06/30/13: RA: 6  RV: 26/9  PA: 26/13  PC: a 18 mean 13 CO/CI Fick: 3.7/2.0 Mild CAD with 30% narrowing in mid AV groove circumflex, and 30% proximal and 40% mid RCA stenosis with otherwise normal LAD and ramus  ECHO (4/15) EF 20%, mild MR, no embolus MRI head: acute infraction with > 3 cm infarct involving L frontal opercular cortex and subcortical white matter (07/2013) ECHO (10/13/13) EF 20-25%  ECHO (4/16) EF 30-35%, diffuse hypokinesis, normal RV size and systolic function.   Patient had atrial fibrillation noted on ICD interrogation and started apixaban in 1/16.   He presents today for follow up. Last seen in clinic 06/2014. He is up 20 lbs since that visit. Had moved to [facility] so was following with a different cards group. He was unsatisfied with his care there. Has now moved back to [name]. Denies DOE on flat ground. Mild SOB with stairs. Occasional lightheaded with rapid standing but not marked or limiting. No further syncope since July of last year. Smokes 1/2 ppd. Drinks a beer on a special occasion. 1-2 times a week. No BRBPR or melena on eliquis. Weight is up but over the past several years. Walks 15-20 minutes daily. Not working. Waiting for disability appear. Wears CPAP every night. No palpitations. Taking lasix 40 mg every other day. Hasn't needed any extra.   SH: No drugs as of 06/28/13. Not working. Applied for disability. Now appealing denial. Has medicaid  FH: Mother: DM2, living        Father deceased, no issues  Review of systems complete and found to be negative unless listed  in HPI.    PMH: 1) HTN 2) NICM: LHC (06/2013): Mild coronary obstructive disease with 30% narrowing in the mid AV groove circumflex, and 30% proximal and 40% mid RCA stenoses with otherwise normal LAD and ramus intermediate vessel.  RHC (06/2013) RA: 6, RV: 26/9, PA: 26/13, PC: a 18 mean 13, CO/CI Fick: 3.7/2.0, left ventriculography EF 15%; ECHO (06/2013) EF 20%, diffuse HK, mild MR; TEE (07/2013) no thrombus; ECHO (10/2013): EF 20-25%, diff HK, LA mod dilated; [employer] ICD implanted (12/01/13).  Echo (4/16) with EF 30-35%, diffuse hypokinesis, normal RV.  3) DM2 4) Stroke: MRI (07/2013) revealed an acute infarction, with a greater than 3 cm infarct involving left frontal opercular cortex and subcortical white matter. Carotid dopplers (07/2013) mild soft plaque origin ICA. 1-39% ICA stenosis. Vertebral artery flow is antegrade 5) Polysubstance abuse: + cocaine UDS in past.  6) Atrial fibrillation: Paroxysmal, started Eliquis in 1/16.    Current Outpatient Prescriptions  Medication Sig Dispense Refill  . apixaban (ELIQUIS) 5 MG TABS tablet Take 1 tablet (5 mg total) by mouth 2 (two) times daily. 180 tablet 0  . atorvastatin (LIPITOR) 80 MG tablet TAKE 1 TABLET BY MOUTH EVERY EVENING 90 tablet 3  . Blood Glucose Monitoring Suppl ([place]) [place] Patient will need to test his blood glucose once daily prior to breakfast and metformin and amaryl administration. 1 Device 0  . carvedilol (COREG) 25 MG tablet TAKE  1 TABLET BY MOUTH 2 TIMES DAILY WITH A MEAL. 60 tablet 3  . diphenhydrAMINE (BENADRYL) 25 MG tablet Take 1 tablet (25 mg total) by mouth every 6 (six) hours. 20 tablet 0  . furosemide (LASIX) 40 MG tablet TAKE 1 TABLET BY MOUTH EVERY OTHER DAY 30 tablet 3  . glucose blood test strip Use as instructed (Patient taking differently: 1 each by Other route 3 (three) times a week. Use as instructed) 100 each 12  . insulin glargine (LANTUS) 100 UNIT/ML injection Inject 0.3 mLs (30 Units  total) into the skin every morning. (Patient taking differently: Inject 40 Units into the skin at bedtime. ) 20 mL 0  . Insulin Syringes, Disposable, U-100 0.3 ML MISC 1 each by Does not apply route daily. 100 each 1  . metFORMIN (GLUCOPHAGE) 500 MG tablet TAKE 2 TABLETS BY MOUTH 2 TIMES DAILY WITH A MEAL. 120 tablet 0  . ReliOn Ultra Thin Lancets MISC Patient will need to test his blood glucose once daily prior to breakfast and metformin and amaryl administration. 100 each 2  . sacubitril-valsartan (ENTRESTO) 49-51 MG Take 1 tablet by mouth 2 (two) times daily. 180 tablet 3  . spironolactone (ALDACTONE) 25 MG tablet TAKE ONE TABLET BY MOUTH EVERY MORNING 30 tablet 0   No current facility-administered medications for this encounter.      Vitals:   07/04/16 1046  BP: 130/82  Pulse: 83  SpO2: 99%  Weight: 203 lb 12 oz (92.4 kg)   Wt Readings from Last 3 Encounters:  07/04/16 203 lb 12 oz (92.4 kg)  08/14/14 190 lb (86.2 kg)  07/01/14 183 lb 1.9 oz (83.1 kg)     PHYSICAL EXAM: General: Well appearing. No resp difficulty. HEENT: normal Neck: supple. JVD 5-6. Carotids 2+ bilat; no bruits. No thyromegaly or nodule noted. Cor: PMI nondisplaced. RRR, No M/G/R noted Lungs: CTAB, normal effort. Abdomen: soft, non-tender, distended, no HSM. No bruits or masses. +BS  Extremities: no cyanosis, clubbing, rash, R and LLE no edema.  Neuro: alert & orientedx3, cranial nerves grossly intact. moves all 4 extremities w/o difficulty. Affect pleasant   EKG NSR 84 bpm, LAD  ASSESSMENT & PLAN:  1) Chronic systolic HF: Nonischemic cardiomyopathy s/p [employer] ICD. Narrow QRS so not CRT candidate.   - Echo 06/2014 30-35% with diffuse hypokinesis. - NYHA II and volume status stable.  - Continue lasix 40 mg every other day.  - Continue coreg 25 mg BID.  - Increase Entresto to 97/103 mg BID. BMET today and repeat 2 weeks.  - Continue spiro 25 mg daily.  - Repeat Echo.  - Reinforced fluid  restriction to < 2 L daily, sodium restriction to less than 2000 mg daily, and the importance of daily weights.  2) Non-obstructive CAD:  - No CP. Continue atorvastatin 80 mg  - Lipids stable 07/2014   3) Atrial fibrillation: Paroxysmal - EKG today shows NSR. - Remains on Eliquis 5 mg BID. Denies bleeding.  4) ?OSA:  - Sleep study + and now using CPAP nightly.  5) Polysubstance abuse: Has been drug free since 06/2013. Keep up the good work.  6) Stroke: In 5/15, likely related to atrial fibrillation. Now on Eliquis.  7) s/p [employer] ICD - Was following with [facility] in [place]. Will send back to device clinic here for maintenance.  8) PCP - Previously followed with [facility]. Have provided number for him to call and make follow up now that he is  back in town.   Labs and meds as above. Repeat Echo. Follow up 3 months. Sooner with symptoms.   Shirley Friar, PA-C  07/04/2016  Greater than 50% of the 25 minute visit was spent in counseling/coordination of care regarding disease state education, salt/fluid restriction, importance of medication compliance, and medication education.

## 2016-07-10 NOTE — Telephone Encounter (Signed)
  07/04/2016 12:48 PM Phone (Outgoing) Tarrance, Januszewski J (Self) [phone] (H)   No Answer/Busy - Called pt to try to schedule echo and he has no VM .Marland Kitchenphone just rang..    By Cherie Dark A    07/09/2016 10:39 AM Phone ([address]) Tarquin, Welcher (Self) [phone] (H)   Left Message - Called and lmsg with a male for the patient to CB .     By Verdene Rio

## 2016-07-15 ENCOUNTER — Telehealth (HOSPITAL_COMMUNITY): Payer: Self-pay | Admitting: Pharmacist

## 2016-07-15 NOTE — Telephone Encounter (Signed)
Entresto PA approved by [place] Medicaid through 07/05/17.   [name], PharmD, BCPS, CPP Clinical Pharmacist Pager: [phone] Phone: [phone] 07/15/2016 12:20 PM

## 2016-07-18 ENCOUNTER — Other Ambulatory Visit: Payer: Self-pay

## 2016-07-18 ENCOUNTER — Ambulatory Visit (HOSPITAL_COMMUNITY)
Admission: RE | Admit: 2016-07-18 | Discharge: 2016-07-18 | Disposition: A | Discharge status: Home / Self Care | Payer: Medicaid Other | Source: Ambulatory Visit | Attending: Cardiovascular Disease | Admitting: Cardiovascular Disease

## 2016-07-18 ENCOUNTER — Ambulatory Visit (INDEPENDENT_AMBULATORY_CARE_PROVIDER_SITE_OTHER): Payer: Medicaid Other | Admitting: Internal Medicine

## 2016-07-18 ENCOUNTER — Telehealth (HOSPITAL_COMMUNITY): Payer: Self-pay

## 2016-07-18 ENCOUNTER — Encounter: Payer: Self-pay | Admitting: Internal Medicine

## 2016-07-18 ENCOUNTER — Ambulatory Visit (HOSPITAL_BASED_OUTPATIENT_CLINIC_OR_DEPARTMENT_OTHER): Payer: Medicaid Other

## 2016-07-18 VITALS — BP 126/84 | HR 78 | Ht 65.0 in | Wt 201.0 lb

## 2016-07-18 DIAGNOSIS — I5022 Chronic systolic (congestive) heart failure: Secondary | ICD-10-CM

## 2016-07-18 DIAGNOSIS — I428 Other cardiomyopathies: Secondary | ICD-10-CM | POA: Insufficient documentation

## 2016-07-18 DIAGNOSIS — Z95810 Presence of automatic (implantable) cardiac defibrillator: Secondary | ICD-10-CM

## 2016-07-18 DIAGNOSIS — I110 Hypertensive heart disease with heart failure: Secondary | ICD-10-CM | POA: Insufficient documentation

## 2016-07-18 DIAGNOSIS — Z8673 Personal history of transient ischemic attack (TIA), and cerebral infarction without residual deficits: Secondary | ICD-10-CM | POA: Diagnosis not present

## 2016-07-18 DIAGNOSIS — I071 Rheumatic tricuspid insufficiency: Secondary | ICD-10-CM | POA: Insufficient documentation

## 2016-07-18 DIAGNOSIS — E119 Type 2 diabetes mellitus without complications: Secondary | ICD-10-CM | POA: Diagnosis not present

## 2016-07-18 DIAGNOSIS — I2510 Atherosclerotic heart disease of native coronary artery without angina pectoris: Secondary | ICD-10-CM | POA: Insufficient documentation

## 2016-07-18 DIAGNOSIS — Z79899 Other long term (current) drug therapy: Secondary | ICD-10-CM

## 2016-07-18 DIAGNOSIS — E785 Hyperlipidemia, unspecified: Secondary | ICD-10-CM | POA: Insufficient documentation

## 2016-07-18 LAB — BASIC METABOLIC PANEL
Anion gap: 8 (ref 5–15)
BUN: 15 mg/dL (ref 6–20)
CHLORIDE: 107 mmol/L (ref 101–111)
CO2: 24 mmol/L (ref 22–32)
CREATININE: 1.01 mg/dL (ref 0.61–1.24)
Calcium: 9.1 mg/dL (ref 8.9–10.3)
GFR calc Af Amer: >60 mL/min (ref >60)
GFR calc non Af Amer: >60 mL/min (ref >60)
GLUCOSE: 150 mg/dL — AB (ref 65–99)
POTASSIUM: 4 mmol/L (ref 3.5–5.1)
Sodium: 139 mmol/L (ref 135–145)

## 2016-07-18 NOTE — Patient Instructions (Addendum)
Medication Instructions:    Your physician recommends that you continue on your current medications as directed. Please refer to the Current Medication list given to you today.  --- If you need a refill on your cardiac medications before your next appointment, please call your pharmacy. ---  Labwork:  CBC w/ diff  Testing/Procedures:  None ordered  Follow-Up: Remote monitoring is used to monitor your Pacemaker of ICD from home. This monitoring reduces the number of office visits required to check your device to one time per year. It allows [place] to keep an eye on the functioning of your device to ensure it is working properly. You are scheduled for a device check from home on 10/17/2016. You may send your transmission at any time that day. If you have a wireless device, the transmission will be sent automatically. After your physician reviews your transmission, you will receive a postcard with your next transmission date.   Your physician wants you to follow-up in: 1 year with Tommye Standard, PA.  You will receive a reminder letter in the mail two months in advance. If you don't receive a letter, please call our office to schedule the follow-up appointment.  Thank you for choosing [facility]!!

## 2016-07-18 NOTE — Progress Notes (Signed)
Patient Care Team: Marjie Skiff, MD as PCP - General (Student)   HPI  Joseph [name] is a 51 y.o. male Seen in NICM w ICD  He has not been seen in about 2 years. He has a history of subclinical atrial fibrillation detected on his device 2016 for which she was started on apixoban. This because he had a prior history of stroke and multiple thromboembolic risk factors.  He has been much more compliant with guidelines directed therapy of late  Most recent echo 5/18 demonstrated normalization of LV function with mild left ventricular hypertrophy  Records and Results Reviewed   Date Cr K Hgb  717   13.7   5/18 1.0 4.0    No interval palpitations  No bleeding issues  He denies chest pain or shortness of breath or peripheral edema.  He has obstructive sleep apnea but does not feel like his CPAP is working  Past Medical History:  Diagnosis Date  . Automatic implantable cardioverter-defibrillator in situ 12/01/2013   SINGLE CHAMBER BY DR Olin Pia  . CAD (coronary artery disease)    a. 06/28/13 non obst dz. managed medically  . CHF (congestive heart failure) ([place])   . High cholesterol   . Hypertension   . Non-ischemic cardiomyopathy ([place])    a. 06/28/13 ECHO: EF 25% and global hypokinesis and cath wuth EF 15%   . Pneumonia ?2004  . Shortness of breath   . Stroke ([place])   . Type II diabetes mellitus ([place])     Past Surgical History:  Procedure Laterality Date  . COLONOSCOPY WITH PROPOFOL N/A 07/08/2014   Procedure: COLONOSCOPY WITH PROPOFOL;  Surgeon: Carol Ada, MD;  Location: [facility] ENDOSCOPY;  Service: Endoscopy;  Laterality: N/A;  . EP IMPLANTABLE DEVICE  12/01/2013   single chamber by dr Caryl Comes  . FINGER AMPUTATION Right 1987   "reattached middle and ring fingers"  . IMPLANTABLE CARDIOVERTER DEFIBRILLATOR IMPLANT N/A 12/01/2013   Procedure: IMPLANTABLE CARDIOVERTER DEFIBRILLATOR IMPLANT;  Surgeon: Deboraha Sprang, MD;  Location: [facility] CATH LAB;  Service: Cardiovascular;   Laterality: N/A;  . LEFT HEART CATHETERIZATION WITH CORONARY ANGIOGRAM N/A 06/30/2013   Procedure: LEFT HEART CATHETERIZATION WITH CORONARY ANGIOGRAM;  Surgeon: Troy Sine, MD;  Location: [facility] CATH LAB;  Service: Cardiovascular;  Laterality: N/A;  . TEE WITHOUT CARDIOVERSION N/A 07/06/2013   Procedure: TRANSESOPHAGEAL ECHOCARDIOGRAM (TEE);  Surgeon: [name], MD;  Location: [facility] ENDOSCOPY;  Service: Cardiovascular;  Laterality: N/A;    Current Outpatient Prescriptions  Medication Sig Dispense Refill  . apixaban (ELIQUIS) 5 MG TABS tablet Take 1 tablet (5 mg total) by mouth 2 (two) times daily. 180 tablet 0  . atorvastatin (LIPITOR) 80 MG tablet TAKE 1 TABLET BY MOUTH EVERY EVENING 90 tablet 3  . Blood Glucose Monitoring Suppl ([place]) DEVI Patient will need to test his blood glucose once daily prior to breakfast and metformin and amaryl administration. 1 Device 0  . carvedilol (COREG) 25 MG tablet TAKE 1 TABLET BY MOUTH 2 TIMES DAILY WITH A MEAL. 60 tablet 3  . diphenhydrAMINE (BENADRYL) 25 MG tablet Take 1 tablet (25 mg total) by mouth every 6 (six) hours. 20 tablet 0  . furosemide (LASIX) 40 MG tablet TAKE 1 TABLET BY MOUTH EVERY OTHER DAY 30 tablet 3  . glucose blood test strip Use as instructed (Patient taking differently: 1 each by Other route 3 (three) times a week. Use as instructed) 100 each 12  . insulin  aspart (NOVOLOG) 100 UNIT/ML injection Inject 15 Units into the skin 3 (three) times daily before meals.    . insulin glargine (LANTUS) 100 UNIT/ML injection Inject 0.3 mLs (30 Units total) into the skin every morning. (Patient taking differently: Inject 40 Units into the skin at bedtime. ) 20 mL 0  . Insulin Syringes, Disposable, U-100 0.3 ML MISC 1 each by Does not apply route daily. 100 each 1  . metFORMIN (GLUCOPHAGE) 500 MG tablet TAKE 2 TABLETS BY MOUTH 2 TIMES DAILY WITH A MEAL. 120 tablet 0  . ReliOn Ultra Thin Lancets MISC Patient will need to test his blood  glucose once daily prior to breakfast and metformin and amaryl administration. 100 each 2  . sacubitril-valsartan (ENTRESTO) 97-103 MG Take 1 tablet by mouth 2 (two) times daily. 60 tablet 6  . spironolactone (ALDACTONE) 25 MG tablet TAKE ONE TABLET BY MOUTH EVERY MORNING 30 tablet 0   No current facility-administered medications for this visit.     No Known Allergies    Review of Systems negative except from HPI and PMH  Physical Exam BP 126/84   Pulse 78   Ht 5\' 5"  (1.651 m)   Wt 201 lb (91.2 kg)   SpO2 97%   BMI 33.45 kg/m  Well developed and nourished in no acute distress HENT normal Neck supple with JVP-flat Carotids brisk and full without bruits Clear Device pocket well healed; without hematoma or erythema.  There is no tethering  Regular rate and rhythm, no murmurs or gallops Abd-soft with active BS without hepatomegaly No Clubbing cyanosis edema Skin-warm and dry A & Oriented  Grossly normal sensory and motor function   ECG demonstrates sinus rhythm at 79  intervals 18/10/39 Axis left -45  Assessment and  Plan Nonischemic cardiomyopathy-interval resolution  Congestive heart failure-chronic-diastolic-class II  Obstructive sleep apnea  Implantable defibrillator-[employer]  The patient's device was interrogated.  The information was reviewed. No changes were made in the programming.     Euvolemic continue current meds  Have talked with him re folowup with CPAP company to get the machine read and sleep md evaluation    Current medicines are reviewed at length with the patient today .  The patient does not  have concerns regarding medicines.

## 2016-07-18 NOTE — Telephone Encounter (Signed)
ECHOCARDIOGRAM COMPLETE  Order: [id number]  Status:  Edited Result - FINAL Visible to patient:  No (Not Released) Dx:  Chronic systolic heart failure ([place])  Notes recorded by Effie Berkshire, RN on 07/18/2016 at 2:37 PM EDT Patient aware, advised to continue current medication regimen and restrictions, and reminded of his appointment in August. ------  Notes recorded by Shirley Friar, PA-C on 07/18/2016 at 12:21 PM EDT EF has returned to normal.     Beryle Beams" Felton, PA-C 07/18/2016 12:21 PM

## 2016-07-19 ENCOUNTER — Telehealth: Payer: Self-pay

## 2016-07-19 LAB — CBC WITH DIFFERENTIAL/PLATELET
BASOS ABS: 0 10*3/uL (ref 0.0–0.2)
BASOS: 0 %
EOS (ABSOLUTE): 0.6 10*3/uL — AB (ref 0.0–0.4)
Eos: 4 %
HEMATOCRIT: 40.8 % (ref 37.5–51.0)
Hemoglobin: 13.4 g/dL (ref 13.0–17.7)
IMMATURE GRANULOCYTES: 1 %
Immature Grans (Abs): 0.1 10*3/uL (ref 0.0–0.1)
Lymphocytes Absolute: 5.1 10*3/uL — ABNORMAL HIGH (ref 0.7–3.1)
Lymphs: 38 %
MCH: 31.3 pg (ref 26.6–33.0)
MCHC: 32.8 g/dL (ref 31.5–35.7)
MCV: 95 fL (ref 79–97)
MONOS ABS: 1.1 10*3/uL — AB (ref 0.1–0.9)
Monocytes: 8 %
NEUTROS ABS: 6.4 10*3/uL (ref 1.4–7.0)
NEUTROS PCT: 49 %
PLATELETS: 246 10*3/uL (ref 150–379)
RBC: 4.28 x10E6/uL (ref 4.14–5.80)
RDW: 14.2 % (ref 12.3–15.4)
WBC: 13.3 10*3/uL — ABNORMAL HIGH (ref 3.4–10.8)

## 2016-07-19 LAB — CUP PACEART INCLINIC DEVICE CHECK
Battery Remaining Longevity: 144 mo
HIGH POWER IMPEDANCE MEASURED VALUE: 74 Ohm
Implantable Lead Implant Date: 20150930
Implantable Lead Model: 180
Implantable Lead Serial Number: 311831
Lead Channel Impedance Value: 408 Ohm
Lead Channel Pacing Threshold Amplitude: 0.8 V
Lead Channel Setting Pacing Amplitude: 2.4 V
Lead Channel Setting Pacing Pulse Width: 0.4 ms
Lead Channel Setting Sensing Sensitivity: 0.6 mV
MDC IDC LEAD LOCATION: 753860
MDC IDC MSMT LEADCHNL RV PACING THRESHOLD PULSEWIDTH: 0.4 ms
MDC IDC MSMT LEADCHNL RV SENSING INTR AMPL: 17.7 mV
MDC IDC PG IMPLANT DT: 20150930
MDC IDC SESS DTM: 20180518105920
Pulse Gen Serial Number: 191604

## 2016-07-19 NOTE — Telephone Encounter (Signed)
Spoke with the patient in regards to his home monitor. He confirmed that he has been followed in [place] by [employer] [facility] but that he now would like [place] to follow him remotely. I will reach out to [place] and request that he be released. Patient confirmed that he would like me to do this.

## 2016-07-24 ENCOUNTER — Telehealth: Payer: Self-pay | Admitting: Internal Medicine

## 2016-07-24 NOTE — Telephone Encounter (Signed)
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  Pt advised I will forward to Dr Caryl Comes for review.

## 2016-07-24 NOTE — Telephone Encounter (Signed)
Patient calling, states that he would like to know if Dr. Caryl Comes has "released" him to go back to work and if so, what are his limitations when he goes back to work. I did inform patient that Nira Conn is not in office today. Thanks.

## 2016-07-25 NOTE — Telephone Encounter (Signed)
Interval normalization of heart muscle funciton   No work restrictions

## 2016-07-25 NOTE — Telephone Encounter (Signed)
Spoke with the patient to find out how long he has been out of work. He states since 2015. 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 I inquired who had kept him out of work and he stated "I guess the doctor did." I advised I would review with Dr. Caryl Comes and call him back when his letter was ready.  He is agreeable- he states he will be in our building on 5/29 and can pick this up then.

## 2016-07-30 ENCOUNTER — Ambulatory Visit (INDEPENDENT_AMBULATORY_CARE_PROVIDER_SITE_OTHER): Payer: Medicaid Other | Admitting: Family Medicine

## 2016-07-30 ENCOUNTER — Other Ambulatory Visit (HOSPITAL_COMMUNITY): Payer: Self-pay | Admitting: Adult Health

## 2016-07-30 ENCOUNTER — Encounter: Payer: Self-pay | Admitting: Family Medicine

## 2016-07-30 ENCOUNTER — Encounter: Payer: Self-pay | Admitting: *Deleted

## 2016-07-30 VITALS — BP 124/64 | HR 89 | Temp 98.5°F | Ht 65.0 in | Wt 201.0 lb

## 2016-07-30 DIAGNOSIS — I272 Pulmonary hypertension, unspecified: Secondary | ICD-10-CM

## 2016-07-30 DIAGNOSIS — R7309 Other abnormal glucose: Secondary | ICD-10-CM | POA: Diagnosis not present

## 2016-07-30 DIAGNOSIS — Z0000 Encounter for general adult medical examination without abnormal findings: Secondary | ICD-10-CM | POA: Diagnosis not present

## 2016-07-30 LAB — POCT GLYCOSYLATED HEMOGLOBIN (HGB A1C): Hemoglobin A1C: 8.2

## 2016-07-30 MED ORDER — SILDENAFIL CITRATE 20 MG PO TABS: 20.0000 mg | ORAL_TABLET | Freq: Three times a day (TID) | ORAL | 0 refills | Status: DC

## 2016-07-30 NOTE — Patient Instructions (Addendum)
It was great seeing you today! We have addressed the following issues today  1. Please make an appointment with dr.[facility] at the front desk 2. I send viagra at your pharmacy start with 20 mg and see how you do will discuss at our next visit.  If we did any lab work today, and the results require attention, either me or my nurse will get in touch with you. If everything is normal, you will get a letter in mail and a message via . If you don't hear from [place] in two weeks, please give [place] a call. Otherwise, we look forward to seeing you again at your next visit. If you have any questions or concerns before then, please call the clinic at [phone].  Please bring all your medications to every doctors visit  Sign up for My Chart to have easy access to your labs results, and communication with your Primary care physician. Please ask Front Desk for some assistance.   Please check-out at the front desk before leaving the clinic.    Take Care,   Dr. Andy Gauss

## 2016-07-30 NOTE — Telephone Encounter (Signed)
Letter done. Patient picked up in office today.

## 2016-07-31 LAB — CBC WITH DIFFERENTIAL/PLATELET
BASOS: 0 %
Basophils Absolute: 0 10*3/uL (ref 0.0–0.2)
EOS (ABSOLUTE): 0.4 10*3/uL (ref 0.0–0.4)
EOS: 4 %
HEMATOCRIT: 38.4 % (ref 37.5–51.0)
HEMOGLOBIN: 13 g/dL (ref 13.0–17.7)
IMMATURE GRANULOCYTES: 1 %
Immature Grans (Abs): 0.1 10*3/uL (ref 0.0–0.1)
LYMPHS ABS: 3.9 10*3/uL — AB (ref 0.7–3.1)
Lymphs: 37 %
MCH: 31.5 pg (ref 26.6–33.0)
MCHC: 33.9 g/dL (ref 31.5–35.7)
MCV: 93 fL (ref 79–97)
MONOCYTES: 6 %
Monocytes Absolute: 0.7 10*3/uL (ref 0.1–0.9)
NEUTROS PCT: 52 %
Neutrophils Absolute: 5.4 10*3/uL (ref 1.4–7.0)
Platelets: 195 10*3/uL (ref 150–379)
RBC: 4.13 x10E6/uL — AB (ref 4.14–5.80)
RDW: 13.6 % (ref 12.3–15.4)
WBC: 10.4 10*3/uL (ref 3.4–10.8)

## 2016-07-31 LAB — LIPID PANEL
CHOL/HDL RATIO: 3.2 ratio (ref 0.0–5.0)
Cholesterol, Total: 99 mg/dL — ABNORMAL LOW (ref 100–199)
HDL: 31 mg/dL — ABNORMAL LOW (ref >39)
LDL CALC: 51 mg/dL (ref 0–99)
Triglycerides: 86 mg/dL (ref 0–149)
VLDL Cholesterol Cal: 17 mg/dL (ref 5–40)

## 2016-07-31 LAB — COMPREHENSIVE METABOLIC PANEL
ALBUMIN: 4 g/dL (ref 3.5–5.5)
ALT: 19 IU/L (ref 0–44)
AST: 15 IU/L (ref 0–40)
Albumin/Globulin Ratio: 1.5 (ref 1.2–2.2)
Alkaline Phosphatase: 60 IU/L (ref 39–117)
BUN / CREAT RATIO: 10 (ref 9–20)
BUN: 10 mg/dL (ref 6–24)
Bilirubin Total: 0.9 mg/dL (ref 0.0–1.2)
CALCIUM: 9.5 mg/dL (ref 8.7–10.2)
CO2: 21 mmol/L (ref 18–29)
CREATININE: 1 mg/dL (ref 0.76–1.27)
Chloride: 109 mmol/L — ABNORMAL HIGH (ref 96–106)
GFR, EST AFRICAN AMERICAN: 100 mL/min/{1.73_m2} (ref >59)
GFR, EST NON AFRICAN AMERICAN: 86 mL/min/{1.73_m2} (ref >59)
GLOBULIN, TOTAL: 2.6 g/dL (ref 1.5–4.5)
Glucose: 209 mg/dL — ABNORMAL HIGH (ref 65–99)
Potassium: 4.5 mmol/L (ref 3.5–5.2)
SODIUM: 143 mmol/L (ref 134–144)
Total Protein: 6.6 g/dL (ref 6.0–8.5)

## 2016-08-01 NOTE — Progress Notes (Signed)
   Subjective:    Patient ID: Joseph [name], male    DOB: Dec 25, 1965, 51 y.o.   MRN: [id number]   CC: Physical   HPI: Patient is a 51 yo male with a past medical history significant for T2DM, HFrEF, HTN, OSA who presents today after being told by his cardiologist that he needed a physical. Patient has no specific complaints today except for some stress since has has been denied for disability and has had search for a job. Patient is also in the process of finding a new apartment which has been stressful. Patient reports erectile dysfunction for the past few months with no prior issue.   Smoking status reviewed   ROS: all other systems were reviewed and are negative other than in the HPI   Past medical history, surgical, family, and social history reviewed and updated in the EMR as appropriate.  Objective:  BP 124/64   Pulse 89   Temp 98.5 F (36.9 C) (Oral)   Ht 5\' 5"  (1.651 m)   Wt 201 lb (91.2 kg)   SpO2 97%   BMI 33.45 kg/m   Vitals and nursing note reviewed  Physical Exam: General Appearance:  well developed, well nourished, in no acute distress and cooperative Head/face:  NCAT Eyes:  PERRL and EOMI Nose/Sinuses:  Nares normal. Septum midline. Mucosa normal. No drainage or sinus tenderness. Mouth/Throat:  Mucosa moist, no lesions; pharynx without erythema, edema or exudate. Neck:  Supple, no mass, non-tender, no bruits, no jvd, Adenopathy- absent Lungs:  Normal expansion.  Clear to auscultation.  No rales, rhonchi, or wheezing. Heart:  Heart sounds are normal.  Regular rate and rhythm without murmur, gallop or rub. Heart sounds: Normal S1, S2 Abdomen:  Soft, non-tender, normal bowel sounds; no bruits, organomegaly or masses. Skin:  Skin color, texture, turgor are normal; there are no bruises, rashes or lesions. Musculoskeletal:  Spine range of motion normal. Muscular strength intact. Neurologic:  Alert and oriented x 3, gait normal., reflexes normal and symmetric,  strength and  sensation grossly normal Psych exam: full range of affect, normal behavior and no psychotic features  Assessment & Plan:   #Establishing care with PCP Patient presents for the first time for physical and no specific complaints. Patient has a long cardiac history with and ICD [facility]) and is followed by Dr. Caryl Comes his cardiologist. Patient will have a repeat ECHO and will follow up with cardiologist in 3 months. Patient is rejoining the workforce after years of being on disability for his heart condition. Patient has not had any labs done in the past 2 years. Will continue current medication regimen as such:   --Eliquis 5 mg for atrial fibrillation and hx of stroke --Lasix 40 mg, Coreg 25 mg BID, Entresto (97/103) BID, Spironolactone 25 mg daily for CHF --Atorvastatin 80 mg daily Non-obstructive CAD  --Order Lipid panel, CBC, CMP and A1c --Will refer to Dr. Valentina Lucks for DM medications optimization  #Erectile dysfunction Patient reports difficulty with getting and maintaining an erection.patient interested on starting medications to aid with issues. Patient does have an extensive cardiac history. --Will prescribe Sildenafil 20 mg as needed to start will titrate as needed  #Smoking cessation Patient wants quit but is not ready to start the process of quitting. Will revisit at next clinic visit.  Marjie Skiff, MD Family Medicine Resident PGY-1

## 2016-08-06 ENCOUNTER — Encounter: Payer: Self-pay | Admitting: Pharmacist

## 2016-08-06 ENCOUNTER — Ambulatory Visit (INDEPENDENT_AMBULATORY_CARE_PROVIDER_SITE_OTHER): Payer: Medicaid Other | Admitting: Pharmacist

## 2016-08-06 DIAGNOSIS — E118 Type 2 diabetes mellitus with unspecified complications: Secondary | ICD-10-CM | POA: Diagnosis not present

## 2016-08-06 DIAGNOSIS — Z794 Long term (current) use of insulin: Secondary | ICD-10-CM | POA: Diagnosis not present

## 2016-08-06 DIAGNOSIS — F17200 Nicotine dependence, unspecified, uncomplicated: Secondary | ICD-10-CM | POA: Diagnosis not present

## 2016-08-06 MED ORDER — INSULIN GLARGINE 100 UNIT/ML SOLOSTAR PEN: 36.0000 [IU] | PEN_INJECTOR | Freq: Every day | SUBCUTANEOUS | 11 refills | Status: DC

## 2016-08-06 MED ORDER — INSULIN ASPART 100 UNIT/ML ~~LOC~~ SOLN: 15.0000 [IU] | Freq: Three times a day (TID) | SUBCUTANEOUS | Status: DC

## 2016-08-06 MED ORDER — INSULIN GLARGINE 100 UNIT/ML ~~LOC~~ SOLN: 36.0000 [IU] | Freq: Every day | SUBCUTANEOUS | 0 refills | Status: DC

## 2016-08-06 MED ORDER — INSULIN ASPART 100 UNIT/ML FLEXPEN: 15.0000 [IU] | PEN_INJECTOR | Freq: Three times a day (TID) | SUBCUTANEOUS | 11 refills | Status: DC

## 2016-08-06 MED ORDER — GLUCOSE BLOOD VI STRP: ORAL_STRIP | 12 refills | Status: DC

## 2016-08-06 NOTE — Assessment & Plan Note (Signed)
History of tobacco use 1ppd in the past and now smoking 0.5ppd.   Uninterested in cessation at this time due to stress.  Offered support if interested in the future.  Nicotine patich has been tried in the past - reported reaction of rash with use - documented in EMR.

## 2016-08-06 NOTE — Progress Notes (Signed)
    S:     Chief Complaint  Patient presents with  . Medication Management    diabetes    Patient arrives walking without assistance and in good mood.  Presents for diabetes evaluation, education, and management at the request of Dr. Andy Gauss. Patient was referred on 07/30/2016 by Primary Care Provider,     Patient reports Diabetes was diagnosed in 2015  Patient reports adherence with medications. Reports taking furosemide only 3 days per week.  Current diabetes medications include: metformin 1000mg  BID, Lantus and Novolog Current hypertension medications include: entresto, carvediolol, spironolactone, furosemide.   Patient reports hypoglycemic events.  He admitted nocturnal episodes of low blood sugar - rarely.   Patient reported dietary habits: Eats 3 meals/day  Patient reported exercise habits:  Limited by very little - able to perform most of his own I ADLs and ADLs.   Patient is willing to work harder to achieve improved blood sugar control.   When reviewing his A1C history he commented that his control should be much better with work on his diet and exercise.   O:  Physical Exam  Constitutional: He appears well-developed and well-nourished.  Musculoskeletal: He exhibits no edema.  Vitals reviewed.    Review of Systems  Constitutional: Negative.   All other systems reviewed and are negative.    Lab Results  Component Value Date   HGBA1C 8.2 07/30/2016   Vitals:   08/06/16 1308  BP: 128/84  Pulse: 83    Home fasting CBG: 92- 204 with majority of fasting reading 100-150  2 hour post-prandial/random CBG: denies checking but willing to start.   A/P: Diabetes diagnosed in 2015 currently improved but with an A1c of 8.2 and higher than goal in a patient willing to work harder to improve control. Patient reports hypoglycemic events and is able to verbalize appropriate hypoglycemia management plan. Patient reports adherence with medication. Control is suboptimal due to  using "sliding scale" of novolog without checking blood sugars.  He has adequate supplies with his [employer] meter.  Adjusted dose of basal insulin Lantus (insulin glargine) from 40 units QPM to 36 units QAM.  Increased dose of rapid insulin Novolog (insulin aspart) from 12-20  prior to meals most commonly 12 units up to 15-20 units with meals.   Asked to take 15 units with meals iif pre meal blood sugar is 80-199 and 20 units if your blood sugar is > 200.  Next A1C anticipated 2-3 months.   History of tobacco use 1ppd in the past and now smoking 0.5ppd.   Uninterested in cessation at this time due to stress.  Offered support if interested in the future.  Nicotine patich has been tried in the past - reported reaction of rash with use - documented in EMR.    Written patient instructions provided.  Total time in face to face counseling 25 minutes.   Follow up in Pharmacist Clinic Visit 3-4 weeks.   Patient seen with Shelle Iron, PharmD Candidate.

## 2016-08-06 NOTE — Patient Instructions (Signed)
Change Lantus to 36 units in the morning.  Start tomorrow.   Change Novolog to 15 units prior to meals if pre meal blood sugar is 80-199.   Novolog dose of 20 units if your blood sugar is > 200.   Next visit with Pharmacy Clinic in 3-4 weeks - bring your meter

## 2016-08-06 NOTE — Assessment & Plan Note (Signed)
Diabetes diagnosed in 2015 currently improved but with an A1c of 8.2 and higher than goal in a patient willing to work harder to improve control. Patient reports hypoglycemic events and is able to verbalize appropriate hypoglycemia management plan. Patient reports adherence with medication. Control is suboptimal due to using "sliding scale" of novolog without checking blood sugars.  He has adequate supplies with his Acc[employer]k meter.  Adjusted dose of basal insulin Lantus (insulin glargine) from 40 units QPM to 36 units QAM.  Increased dose of rapid insulin Novolog (insulin aspart) from 12-20  prior to meals most commonly 12 units up to 15-20 units with meals.   Asked to take 15 units with meals iif pre meal blood sugar is 80-199 and 20 units if your blood sugar is > 200.  Next A1C anticipated 2-3 months.

## 2016-08-07 NOTE — Progress Notes (Signed)
Patient ID: Joseph [name], male   DOB: 1965/12/21, 51 y.o.   MRN: [id number] Reviewed: Agree with Dr. Graylin Shiver documentation and assessment.

## 2016-08-14 ENCOUNTER — Other Ambulatory Visit: Payer: Self-pay | Admitting: Family Medicine

## 2016-08-14 DIAGNOSIS — I272 Pulmonary hypertension, unspecified: Secondary | ICD-10-CM

## 2016-08-29 ENCOUNTER — Ambulatory Visit: Payer: Medicaid Other | Admitting: Pharmacist

## 2016-09-09 ENCOUNTER — Telehealth: Payer: Self-pay | Admitting: Family Medicine

## 2016-09-09 NOTE — Telephone Encounter (Signed)
Pt is calling because the doctor only called in 10 tablets of his Revatio. He takes this 3 times a day. He thought that he was suppose to be taking this medication for at least 30 days. Please call him and discuss this with him. jw

## 2016-09-23 ENCOUNTER — Other Ambulatory Visit: Payer: Self-pay | Admitting: Family Medicine

## 2016-09-23 DIAGNOSIS — I272 Pulmonary hypertension, unspecified: Secondary | ICD-10-CM

## 2016-09-27 ENCOUNTER — Other Ambulatory Visit: Payer: Self-pay | Admitting: Family Medicine

## 2016-09-27 DIAGNOSIS — E118 Type 2 diabetes mellitus with unspecified complications: Secondary | ICD-10-CM

## 2016-09-27 DIAGNOSIS — Z794 Long term (current) use of insulin: Principal | ICD-10-CM

## 2016-09-27 MED ORDER — GLUCOSE BLOOD VI STRP: ORAL_STRIP | 12 refills | Status: DC

## 2016-09-27 MED ORDER — INSULIN GLARGINE 100 UNIT/ML SOLOSTAR PEN: 36.0000 [IU] | PEN_INJECTOR | Freq: Every day | SUBCUTANEOUS | 11 refills | Status: DC

## 2016-09-27 MED ORDER — INSULIN ASPART 100 UNIT/ML FLEXPEN: 15.0000 [IU] | PEN_INJECTOR | Freq: Three times a day (TID) | SUBCUTANEOUS | 11 refills | Status: DC

## 2016-09-27 NOTE — Telephone Encounter (Signed)
Pt calling to request refill of:  Name of Medication(s):  Lantus, novolog, and test strips Last date of OV:  08-06-16 Pharmacy:  [facility]  Will route refill request to Clinic RN.  Discussed with patient policy to call pharmacy for future refills.  Also, discussed refills may take up to 48 hours to approve or deny.  Renella Cunas

## 2016-10-09 ENCOUNTER — Emergency Department (HOSPITAL_COMMUNITY)
Admission: EM | Admit: 2016-10-09 | Discharge: 2016-10-09 | Disposition: A | Discharge status: Home / Self Care | Payer: Medicaid Other | Attending: Emergency Medicine | Admitting: Emergency Medicine

## 2016-10-09 ENCOUNTER — Emergency Department (HOSPITAL_COMMUNITY): Payer: Medicaid Other

## 2016-10-09 ENCOUNTER — Encounter (HOSPITAL_COMMUNITY): Payer: Self-pay | Admitting: *Deleted

## 2016-10-09 DIAGNOSIS — N200 Calculus of kidney: Secondary | ICD-10-CM | POA: Insufficient documentation

## 2016-10-09 DIAGNOSIS — Z794 Long term (current) use of insulin: Secondary | ICD-10-CM | POA: Insufficient documentation

## 2016-10-09 DIAGNOSIS — I5022 Chronic systolic (congestive) heart failure: Secondary | ICD-10-CM | POA: Insufficient documentation

## 2016-10-09 DIAGNOSIS — F17210 Nicotine dependence, cigarettes, uncomplicated: Secondary | ICD-10-CM | POA: Insufficient documentation

## 2016-10-09 DIAGNOSIS — I2510 Atherosclerotic heart disease of native coronary artery without angina pectoris: Secondary | ICD-10-CM | POA: Diagnosis not present

## 2016-10-09 DIAGNOSIS — R1032 Left lower quadrant pain: Secondary | ICD-10-CM | POA: Diagnosis present

## 2016-10-09 DIAGNOSIS — I110 Hypertensive heart disease with heart failure: Secondary | ICD-10-CM | POA: Insufficient documentation

## 2016-10-09 DIAGNOSIS — E119 Type 2 diabetes mellitus without complications: Secondary | ICD-10-CM | POA: Insufficient documentation

## 2016-10-09 DIAGNOSIS — Z7901 Long term (current) use of anticoagulants: Secondary | ICD-10-CM | POA: Diagnosis not present

## 2016-10-09 LAB — COMPREHENSIVE METABOLIC PANEL
ALBUMIN: 3.7 g/dL (ref 3.5–5.0)
ALT: 11 U/L — ABNORMAL LOW (ref 17–63)
AST: 12 U/L — AB (ref 15–41)
Alkaline Phosphatase: 47 U/L (ref 38–126)
Anion gap: 9 (ref 5–15)
BILIRUBIN TOTAL: 1 mg/dL (ref 0.3–1.2)
BUN: 26 mg/dL — AB (ref 6–20)
CO2: 21 mmol/L — AB (ref 22–32)
Calcium: 8.9 mg/dL (ref 8.9–10.3)
Chloride: 103 mmol/L (ref 101–111)
Creatinine, Ser: 2.27 mg/dL — ABNORMAL HIGH (ref 0.61–1.24)
GFR calc Af Amer: 36 mL/min — ABNORMAL LOW (ref >60)
GFR calc non Af Amer: 31 mL/min — ABNORMAL LOW (ref >60)
GLUCOSE: 131 mg/dL — AB (ref 65–99)
POTASSIUM: 3.9 mmol/L (ref 3.5–5.1)
Sodium: 133 mmol/L — ABNORMAL LOW (ref 135–145)
TOTAL PROTEIN: 6.9 g/dL (ref 6.5–8.1)

## 2016-10-09 LAB — URINALYSIS, ROUTINE W REFLEX MICROSCOPIC
Bacteria, UA: NONE SEEN
Bilirubin Urine: NEGATIVE
Glucose, UA: 50 mg/dL — AB
Ketones, ur: NEGATIVE mg/dL
Leukocytes, UA: NEGATIVE
NITRITE: NEGATIVE
Protein, ur: 30 mg/dL — AB
SPECIFIC GRAVITY, URINE: 1.024 (ref 1.005–1.030)
pH: 5 (ref 5.0–8.0)

## 2016-10-09 LAB — CBC
HEMATOCRIT: 40 % (ref 39.0–52.0)
Hemoglobin: 13.3 g/dL (ref 13.0–17.0)
MCH: 30.9 pg (ref 26.0–34.0)
MCHC: 33.3 g/dL (ref 30.0–36.0)
MCV: 93 fL (ref 78.0–100.0)
Platelets: 203 10*3/uL (ref 150–400)
RBC: 4.3 MIL/uL (ref 4.22–5.81)
RDW: 13.5 % (ref 11.5–15.5)
WBC: 12.7 10*3/uL — ABNORMAL HIGH (ref 4.0–10.5)

## 2016-10-09 MED ORDER — SODIUM CHLORIDE 0.9 % IV BOLUS (SEPSIS)
1000.0000 mL | Freq: Once | INTRAVENOUS | Status: AC
  Administered 2016-10-09: 1000 mL via INTRAVENOUS

## 2016-10-09 MED ORDER — MORPHINE SULFATE (PF) 4 MG/ML IV SOLN
4.0000 mg | Freq: Once | INTRAVENOUS | Status: AC | PRN
  Administered 2016-10-09: 4 mg via INTRAVENOUS
  Filled 2016-10-09: qty 1

## 2016-10-09 MED ORDER — KETOROLAC TROMETHAMINE 30 MG/ML IJ SOLN
30.0000 mg | Freq: Once | INTRAMUSCULAR | Status: AC
  Administered 2016-10-09: 30 mg via INTRAVENOUS
  Filled 2016-10-09: qty 1

## 2016-10-09 MED ORDER — HYDROCODONE-ACETAMINOPHEN 5-325 MG PO TABS: 1.0000 | ORAL_TABLET | Freq: Four times a day (QID) | ORAL | 0 refills | Status: DC | PRN

## 2016-10-09 MED ORDER — ONDANSETRON HCL 4 MG/2ML IJ SOLN
4.0000 mg | Freq: Once | INTRAMUSCULAR | Status: AC
  Administered 2016-10-09: 4 mg via INTRAVENOUS
  Filled 2016-10-09: qty 2

## 2016-10-09 NOTE — ED Provider Notes (Signed)
[facility] DEPT Provider Note   CSN: [id number] Arrival date & time: 10/09/16  8338     History   Chief Complaint Chief Complaint  Patient presents with  . Abdominal Pain    HPI Joseph [name] is a 51 y.o. male.  The history is provided by the patient and medical records. No language interpreter was used.  Abdominal Pain   Associated symptoms include nausea. Pertinent negatives include diarrhea, vomiting, constipation, dysuria and frequency.   Joseph [name] is a 51 y.o. male  with a PMH of HTN, HLD, CHF, CAD who presents to the Emergency Department complaining of persistent left flank pain x 2 days. Pain comes-and-goes. Has started to radiate down toward groin. Associated symptoms include nausea. He denies any associated fevers, chills, back pain, vomiting, dysuria, urinary urgency/hestiancy, diarrhea or constipation today. Took Norco at home with mild relief in symptoms, but pain has now returned. Patient states that he was seen by an outside hospital 2 months ago for a similar pain and was diagnosed with kidney stones. He has not had follow up with anyone since diagnosis. At that time, patient states that stone passed on its own while urinating.    Past Medical History:  Diagnosis Date  . Automatic implantable cardioverter-defibrillator in situ 12/01/2013   SINGLE CHAMBER BY DR Olin Pia  . CAD (coronary artery disease)    a. 06/28/13 non obst dz. managed medically  . CHF (congestive heart failure) ([place])   . High cholesterol   . Hypertension   . Non-ischemic cardiomyopathy ([place])    a. 06/28/13 ECHO: EF 25% and global hypokinesis and cath wuth EF 15%   . Pneumonia ?2004  . Shortness of breath   . Stroke ([place])   . Type II diabetes mellitus [facility])     Patient Active Problem List   Diagnosis Date Noted  . ICD (implantable cardioverter-defibrillator), single, in situ 07/04/2016  . OSA on CPAP 08/22/2014  . Snoring 02/17/2014  . Other primary cardiomyopathies 12/01/2013  .  Chronic systolic heart failure ([place]) 07/21/2013  . Polysubstance abuse 07/21/2013  . Smoking 07/21/2013  . Long-term insulin use in type 2 diabetes ([place]) 07/07/2013  . HTN (hypertension) 07/07/2013  . Stroke ([place]) 07/04/2013  . HFrEF (heart failure with reduced ejection fraction) ([place]) 07/02/2013    Class: Diagnosis of    Past Surgical History:  Procedure Laterality Date  . COLONOSCOPY WITH PROPOFOL N/A 07/08/2014   Procedure: COLONOSCOPY WITH PROPOFOL;  Surgeon: Carol Ada, MD;  Location: [facility] ENDOSCOPY;  Service: Endoscopy;  Laterality: N/A;  . EP IMPLANTABLE DEVICE  12/01/2013   single chamber by dr Caryl Comes  . FINGER AMPUTATION Right 1987   "reattached middle and ring fingers"  . IMPLANTABLE CARDIOVERTER DEFIBRILLATOR IMPLANT N/A 12/01/2013   Procedure: IMPLANTABLE CARDIOVERTER DEFIBRILLATOR IMPLANT;  Surgeon: Deboraha Sprang, MD;  Location: [facility] CATH LAB;  Service: Cardiovascular;  Laterality: N/A;  . LEFT HEART CATHETERIZATION WITH CORONARY ANGIOGRAM N/A 06/30/2013   Procedure: LEFT HEART CATHETERIZATION WITH CORONARY ANGIOGRAM;  Surgeon: Troy Sine, MD;  Location: [facility] CATH LAB;  Service: Cardiovascular;  Laterality: N/A;  . TEE WITHOUT CARDIOVERSION N/A 07/06/2013   Procedure: TRANSESOPHAGEAL ECHOCARDIOGRAM (TEE);  Surgeon: [name], MD;  Location: [facility] ENDOSCOPY;  Service: Cardiovascular;  Laterality: N/A;       Home Medications    Prior to Admission medications   Medication Sig Start Date End Date Taking? Authorizing Provider  apixaban (ELIQUIS) 5 MG TABS tablet Take 1 tablet (5 mg total)  by mouth 2 (two) times daily. 11/02/14   Deboraha Sprang, MD  atorvastatin (LIPITOR) 80 MG tablet TAKE 1 TABLET BY MOUTH EVERY EVENING 02/02/16   Larey Dresser, MD  carvedilol (COREG) 25 MG tablet TAKE 1 TABLET BY MOUTH 2 TIMES DAILY WITH A MEAL. 01/01/16   Clegg, Amy D, NP  diphenhydrAMINE (BENADRYL) 25 MG tablet Take 1 tablet (25 mg total) by mouth every 6 (six) hours. Patient not  taking: Reported on 08/06/2016 06/13/14   Delos Haring, PA-C  furosemide (LASIX) 40 MG tablet TAKE 1 TABLET BY MOUTH EVERY OTHER DAY 07/31/16   Bensimhon, Shaune Pascal, MD  glucose blood (ACCU-CHEK AVIVA) test strip Use as instructed 09/27/16   Diallo, Earna Coder, MD  HYDROcodone-acetaminophen (NORCO) 5-325 MG tablet Take 1 tablet by mouth every 6 (six) hours as needed for moderate pain. 10/09/16   Kelli Egolf, Ozella Almond, PA-C  insulin aspart (NOVOLOG FLEXPEN) 100 UNIT/ML FlexPen Inject 15-20 Units into the skin 3 (three) times daily with meals. Take 15 if CBGs 80-199 and 20 units if > 200 09/27/16   Diallo, Earna Coder, MD  Insulin Glargine (LANTUS SOLOSTAR) 100 UNIT/ML Solostar Pen Inject 36 Units into the skin daily with breakfast. 09/27/16   Diallo, Abdoulaye, MD  Insulin Syringes, Disposable, U-100 0.3 ML MISC 1 each by Does not apply route daily. Patient not taking: Reported on 08/06/2016 05/09/14   Olam Idler, MD  metFORMIN (GLUCOPHAGE) 1000 MG tablet Take 1,000 mg by mouth 2 (two) times daily with a meal.    [provider]  ReliOn Ultra Thin Lancets MISC Patient will need to test his blood glucose once daily prior to breakfast and metformin and amaryl administration. 07/07/13   Olam Idler, MD  sacubitril-valsartan (ENTRESTO) 97-103 MG Take 1 tablet by mouth 2 (two) times daily. 07/04/16   Shirley Friar, PA-C  sildenafil (REVATIO) 20 MG tablet TAKE ONE TABLET BY MOUTH 3 TIMES A DAY 09/23/16   Diallo, Earna Coder, MD  spironolactone (ALDACTONE) 25 MG tablet TAKE ONE TABLET BY MOUTH EVERY MORNING 04/05/16   Bensimhon, Shaune Pascal, MD    Family History No family history on file.  Social History Social History  Substance Use Topics  . Smoking status: Current Some Day Smoker    Packs/day: 0.50    Years: 31.00    Types: Cigarettes  . Smokeless tobacco: Never Used     Comment: Quit for 1 month in past - previous 1 ppd   . Alcohol use 19.2 oz/week    21 Cans of beer, 11 Shots of liquor per  week     Comment: 06/29/2013 "2-3 beers/day; maybe 1 pint/wk"     Allergies   Tape   Review of Systems Review of Systems  Gastrointestinal: Positive for abdominal pain and nausea. Negative for blood in stool, constipation, diarrhea and vomiting.  Genitourinary: Positive for flank pain. Negative for difficulty urinating, dysuria, frequency and urgency.  All other systems reviewed and are negative.    Physical Exam Updated Vital Signs BP (!) 142/89   Pulse 90   Temp 98.1 F (36.7 C) (Oral)   Resp 20   Ht 5\' 5"  (1.651 m)   Wt 90.7 kg (200 lb)   SpO2 96%   BMI 33.28 kg/m   Physical Exam  Constitutional: He is oriented to person, place, and time. He appears well-developed and well-nourished. No distress.  HENT:  Head: Normocephalic and atraumatic.  Cardiovascular: Normal rate, regular rhythm and normal heart sounds.   No  murmur heard. Pulmonary/Chest: Effort normal and breath sounds normal. No respiratory distress.  Abdominal: Soft. He exhibits no distension. There is no tenderness.  Tenderness to palpation along the left flank with no overlying skin changes. No CVA tenderness.  Musculoskeletal: He exhibits no edema.  Neurological: He is alert and oriented to person, place, and time.  Skin: Skin is warm and dry.  Nursing note and vitals reviewed.    ED Treatments / Results  Labs (all labs ordered are listed, but only abnormal results are displayed) Labs Reviewed  COMPREHENSIVE METABOLIC PANEL - Abnormal; Notable for the following:       Result Value   Sodium 133 (*)    CO2 21 (*)    Glucose, Bld 131 (*)    BUN 26 (*)    Creatinine, Ser 2.27 (*)    AST 12 (*)    ALT 11 (*)    GFR calc non Af Amer 31 (*)    GFR calc Af Amer 36 (*)    All other components within normal limits  CBC - Abnormal; Notable for the following:    WBC 12.7 (*)    All other components within normal limits  URINALYSIS, ROUTINE W REFLEX MICROSCOPIC - Abnormal; Notable for the following:      APPearance HAZY (*)    Glucose, UA 50 (*)    Hgb urine dipstick LARGE (*)    Protein, ur 30 (*)    Squamous Epithelial / LPF 0-5 (*)    All other components within normal limits    EKG  EKG Interpretation None       Radiology Ct Renal Stone Study  Result Date: 10/09/2016 CLINICAL DATA:  Left flank pain EXAM: CT ABDOMEN AND PELVIS WITHOUT CONTRAST TECHNIQUE: Multidetector CT imaging of the abdomen and pelvis was performed following the standard protocol without IV contrast. COMPARISON:  None. FINDINGS: Lower chest: Lung bases clear. Pacemaker lead in the right ventricle. Heart size within normal limits Hepatobiliary: Normal liver.  Gallbladder and bile ducts normal. Pancreas: Negative Spleen: Negative Adrenals/Urinary Tract: Mild left hydronephrosis and hydroureter. 1 mm obstructing stone distal left ureter approximately 3 cm above the UVJ. 2 mm nonobstructing left midpole calculus. No right renal calculus or obstruction. No renal mass. Urinary bladder negative Stomach/Bowel: Small hiatal hernia. Normal stomach. Fluid-filled loops of jejunum with air-fluid levels. Mildly dilated jejunum 3.4 mm. Negative for bowel mass or edema. Normal appendix Vascular/Lymphatic: Mild atherosclerotic aorta Reproductive: Normal sized prostate Other: No free fluid Musculoskeletal: Mild lumbar degenerative change. No acute skeletal abnormality. IMPRESSION: 1 mm obstructing stone distal left ureter. Additional 2 mm left lower pole calculus. Atherosclerotic aorta Probable ileus with air-fluid levels in mildly distended jejunum. Electronically Signed   By: Franchot Gallo M.D.   On: 10/09/2016 11:26    Procedures Procedures (including critical care time)  Medications Ordered in ED Medications  sodium chloride 0.9 % bolus 1,000 mL (0 mLs Intravenous Stopped 10/09/16 1148)  ketorolac (TORADOL) 30 MG/ML injection 30 mg (30 mg Intravenous Given 10/09/16 1053)  morphine 4 MG/ML injection 4 mg (4 mg Intravenous Given  10/09/16 1055)  ondansetron (ZOFRAN) injection 4 mg (4 mg Intravenous Given 10/09/16 1051)     Initial Impression / Assessment and Plan / ED Course  I have reviewed the triage vital signs and the nursing notes.  Pertinent labs & imaging results that were available during my care of the patient were reviewed by me and considered in my medical decision making (see chart for details).  Joseph [name] is a 51 y.o. male who presents to ED for left flank pain. Afebrile, hemodynamically stable on exam. Tenderness to left flank but no CVA or abdominal tenderness. Labs with bump in creatinine and mild leukocytosis at 12.7. UA with no signs of infection. CT shows stone. Also shows probable ileus. Patient had BM this morning. No vomiting. Instructed if vomiting develops then he must return to ER. He feels improved after meds and fluids in ED. Tolerating PO. Symptomatic home care instructions discussed. Stressed importance of follow up with urology as this is 2nd stone in 2 months and with increase in kidney function. Wife at bedside states they will call today. Reasons to return to ER again discussed and all questions answered.   Patient discussed with Dr. Rogene Houston who agrees with treatment plan.    Final Clinical Impressions(s) / ED Diagnoses   Final diagnoses:  Kidney stone    New Prescriptions New Prescriptions   HYDROCODONE-ACETAMINOPHEN (NORCO) 5-325 MG TABLET    Take 1 tablet by mouth every 6 (six) hours as needed for moderate pain.     Zackery Brine, Ozella Almond, PA-C 10/09/16 1154    Fredia Sorrow, MD 10/14/16 [phone]

## 2016-10-09 NOTE — ED Triage Notes (Signed)
Pt c/o L flank and abdominal pain x2 days. Hx of kidney stones, pt unsure if pain is same as previous kidney stones

## 2016-10-09 NOTE — ED Notes (Signed)
Pt given sprite to drink. 

## 2016-10-09 NOTE — Discharge Instructions (Signed)
It was my pleasure taking care of you today!   You have been diagnosed with kidney stones. Drink plenty of fluids to help you pass the stone.  Use your pain medication as directed and only as needed for severe pain.   Follow up with the urology clinic listed in regards to your hospital visit.   Return to the ED immediately if you develop fever that persists > 101, uncontrolled pain or vomiting, or other concerns.   Do not drink alcohol, drive or participate in any other potentially dangerous activities while taking opiate pain medication as it may make you sleepy. Do not take this medication with any other sedating medications, either prescription or over-the-counter. If you were prescribed Percocet or Vicodin, do not take these with acetaminophen (Tylenol) as it is already contained within these medications.   This medication is an opiate (or narcotic) pain medication and can be habit forming.  Use it as little as possible to achieve adequate pain control.  Do not use or use it with extreme caution if you have a history of opiate abuse or dependence. This medication is intended for your use only - do not give any to anyone else and keep it in a secure place where nobody else, especially children, have access to it. It will also cause or worsen constipation, so you may want to consider taking an over-the-counter stool softener while you are taking this medication.

## 2016-10-15 ENCOUNTER — Ambulatory Visit (HOSPITAL_COMMUNITY)
Admission: RE | Admit: 2016-10-15 | Discharge: 2016-10-15 | Disposition: A | Discharge status: Home / Self Care | Payer: Medicaid Other | Source: Ambulatory Visit | Attending: Cardiology | Admitting: Cardiology

## 2016-10-15 ENCOUNTER — Encounter (HOSPITAL_COMMUNITY): Payer: Self-pay | Admitting: Cardiology

## 2016-10-15 VITALS — BP 104/72 | HR 101 | Wt 198.8 lb

## 2016-10-15 DIAGNOSIS — G4733 Obstructive sleep apnea (adult) (pediatric): Secondary | ICD-10-CM

## 2016-10-15 DIAGNOSIS — Z8673 Personal history of transient ischemic attack (TIA), and cerebral infarction without residual deficits: Secondary | ICD-10-CM | POA: Insufficient documentation

## 2016-10-15 DIAGNOSIS — N200 Calculus of kidney: Secondary | ICD-10-CM

## 2016-10-15 DIAGNOSIS — N179 Acute kidney failure, unspecified: Secondary | ICD-10-CM | POA: Diagnosis not present

## 2016-10-15 DIAGNOSIS — I5022 Chronic systolic (congestive) heart failure: Secondary | ICD-10-CM

## 2016-10-15 DIAGNOSIS — Z794 Long term (current) use of insulin: Secondary | ICD-10-CM | POA: Diagnosis not present

## 2016-10-15 DIAGNOSIS — I110 Hypertensive heart disease with heart failure: Secondary | ICD-10-CM | POA: Diagnosis not present

## 2016-10-15 DIAGNOSIS — F17210 Nicotine dependence, cigarettes, uncomplicated: Secondary | ICD-10-CM | POA: Insufficient documentation

## 2016-10-15 DIAGNOSIS — Z9989 Dependence on other enabling machines and devices: Secondary | ICD-10-CM | POA: Diagnosis not present

## 2016-10-15 DIAGNOSIS — I2510 Atherosclerotic heart disease of native coronary artery without angina pectoris: Secondary | ICD-10-CM | POA: Insufficient documentation

## 2016-10-15 DIAGNOSIS — Z7901 Long term (current) use of anticoagulants: Secondary | ICD-10-CM | POA: Diagnosis not present

## 2016-10-15 DIAGNOSIS — I480 Paroxysmal atrial fibrillation: Secondary | ICD-10-CM | POA: Diagnosis not present

## 2016-10-15 DIAGNOSIS — Z87442 Personal history of urinary calculi: Secondary | ICD-10-CM | POA: Insufficient documentation

## 2016-10-15 DIAGNOSIS — Z79899 Other long term (current) drug therapy: Secondary | ICD-10-CM | POA: Diagnosis not present

## 2016-10-15 DIAGNOSIS — E119 Type 2 diabetes mellitus without complications: Secondary | ICD-10-CM | POA: Diagnosis not present

## 2016-10-15 LAB — BASIC METABOLIC PANEL
Anion gap: 11 (ref 5–15)
BUN: 38 mg/dL — ABNORMAL HIGH (ref 6–20)
CALCIUM: 10.2 mg/dL (ref 8.9–10.3)
CHLORIDE: 101 mmol/L (ref 101–111)
CO2: 22 mmol/L (ref 22–32)
Creatinine, Ser: 4.07 mg/dL — ABNORMAL HIGH (ref 0.61–1.24)
GFR calc Af Amer: 18 mL/min — ABNORMAL LOW (ref >60)
GFR, EST NON AFRICAN AMERICAN: 16 mL/min — AB (ref >60)
Glucose, Bld: 283 mg/dL — ABNORMAL HIGH (ref 65–99)
Potassium: 4.6 mmol/L (ref 3.5–5.1)
SODIUM: 134 mmol/L — AB (ref 135–145)

## 2016-10-15 NOTE — Patient Instructions (Addendum)
Will refer to [facility] for kidney stones. Address: [address], [place], [place] [address] Phone: [phone]  Routine lab work today. Will notify you of abnormal results, otherwise no news is good news!  Follow up 4 months with Dr. Aundra Dubin.  Take all medication as prescribed the day of your appointment. Bring all medications with you to your appointment.  Do the following things EVERYDAY: 1) Weigh yourself in the morning before breakfast. Write it down and keep it in a log. 2) Take your medicines as prescribed 3) Eat low salt foods-Limit salt (sodium) to 2000 mg per day.  4) Stay as active as you can everyday 5) Limit all fluids for the day to less than 2 liters

## 2016-10-15 NOTE — Progress Notes (Signed)
Patient ID: Joseph [name], male   DOB: 01/13/1966, 51 y.o.   MRN: [id number]     Advanced Heart Failure Clinic Note   PCP: Dr. Andy Gauss EP: Dr Caryl Comes  HF: Dr. Aundra Dubin   HPI: Joseph [name] is a 51 y.o. male with history of chronic systolic HF, nonischemic cardiomyopathy, HTN, TIA, stroke, DM2, paroxysmal atrial fibrillation tobacco abuse and polysubstance abuse.   Patient had atrial fibrillation noted on ICD interrogation and started apixaban in 1/16.   He moved to [place] for a couple of years but is now back in [place].  He returns for followup.  Most recent echo in 5/18 showed improvement in EF to 50-55% with mild LVH. No significant exertional dyspnea.  No orthopnea/PND.  No palpitations or chest pain.  He was admitted recently with back pain and found to have obstructing kidney stones on CT.  Creatinine was up to 2.27.  He was encouraged to drink fluids.  Meds were not changed.  He never made an appointment with urology.  Today, he says that he is not having any further pain.  Weight is down 5 lbs. Not using CPAP.   Labs (5/18): K 4.5, creatinine 1.0 Labs (8/18): hgb 13.3, LDL 51, K 3.9, creatinine 2.27  ECG (personally reviewed): NSR, inferior Qs, poor RWP  SH: No drugs as of 06/28/13. Smokes 1/2 ppd.  No ETOH.   FH: Mother: DM2, living        Father deceased, no issues  Review of systems complete and found to be negative unless listed in HPI.    PMH: 1) HTN 2) NICM: LHC (06/2013): Mild coronary obstructive disease with 30% narrowing in the mid AV groove circumflex, and 30% proximal and 40% mid RCA stenoses with otherwise normal LAD and ramus intermediate vessel.  RHC (06/2013) RA: 6, RV: 26/9, PA: 26/13, PC: a 18 mean 13, CO/CI Fick: 3.7/2.0, left ventriculography EF 15%; ECHO (06/2013) EF 20%, diffuse HK, mild MR; TEE (07/2013) no thrombus; ECHO (10/2013): EF 20-25%, diff HK, LA mod dilated; [employer] ICD implanted (12/01/13).  Echo (4/16) with EF 30-35%, diffuse hypokinesis,  normal RV.  - Echo (5/18) with EF 50-55%, mild LVH.  3) DM2 4) Stroke: MRI (07/2013) revealed an acute infarction, with a greater than 3 cm infarct involving left frontal opercular cortex and subcortical white matter. Carotid dopplers (07/2013) mild soft plaque origin ICA. 1-39% ICA stenosis. Vertebral artery flow is antegrade 5) Polysubstance abuse: + cocaine UDS in past.  6) Atrial fibrillation: Paroxysmal, started Eliquis in 1/16.  7) Nephrolithiasis with AKI   Current Outpatient Prescriptions  Medication Sig Dispense Refill  . apixaban (ELIQUIS) 5 MG TABS tablet Take 1 tablet (5 mg total) by mouth 2 (two) times daily. 180 tablet 0  . atorvastatin (LIPITOR) 80 MG tablet TAKE 1 TABLET BY MOUTH EVERY EVENING 90 tablet 3  . carvedilol (COREG) 25 MG tablet TAKE 1 TABLET BY MOUTH 2 TIMES DAILY WITH A MEAL. 60 tablet 3  . furosemide (LASIX) 40 MG tablet TAKE 1 TABLET BY MOUTH EVERY OTHER DAY 30 tablet 3  . glucose blood (ACCU-CHEK AVIVA) test strip Use as instructed 100 each 12  . HYDROcodone-acetaminophen (NORCO) 5-325 MG tablet Take 1 tablet by mouth every 6 (six) hours as needed for moderate pain. 12 tablet 0  . insulin aspart (NOVOLOG FLEXPEN) 100 UNIT/ML FlexPen Inject 15-20 Units into the skin 3 (three) times daily with meals. Take 15 if CBGs 80-199 and 20 units if > 200 15 mL  11  . Insulin Glargine (LANTUS SOLOSTAR) 100 UNIT/ML Solostar Pen Inject 36 Units into the skin daily with breakfast. 15 mL 11  . Insulin Syringes, Disposable, U-100 0.3 ML MISC 1 each by Does not apply route daily. 100 each 1  . metFORMIN (GLUCOPHAGE) 1000 MG tablet Take 1,000 mg by mouth 2 (two) times daily with a meal.    . ReliOn Ultra Thin Lancets MISC Patient will need to test his blood glucose once daily prior to breakfast and metformin and amaryl administration. 100 each 2  . sacubitril-valsartan (ENTRESTO) 97-103 MG Take 1 tablet by mouth 2 (two) times daily. 60 tablet 6  . spironolactone (ALDACTONE) 25 MG  tablet TAKE ONE TABLET BY MOUTH EVERY MORNING 30 tablet 0  . diphenhydrAMINE (BENADRYL) 25 MG tablet Take 1 tablet (25 mg total) by mouth every 6 (six) hours. (Patient not taking: Reported on 08/06/2016) 20 tablet 0   No current facility-administered medications for this encounter.      Vitals:   10/15/16 0957  BP: 104/72  Pulse: (!) 101  SpO2: 97%  Weight: 198 lb 12.8 oz (90.2 kg)   Wt Readings from Last 3 Encounters:  10/15/16 198 lb 12.8 oz (90.2 kg)  10/09/16 200 lb (90.7 kg)  08/06/16 200 lb 6.4 oz (90.9 kg)     PHYSICAL EXAM: General: NAD Neck: Thick, no JVD, no thyromegaly or thyroid nodule.  Lungs: Clear to auscultation bilaterally with normal respiratory effort. CV: Nondisplaced PMI.  Heart regular S1/S2, no S3/S4, no murmur.  No peripheral edema.  No carotid bruit.  Normal pedal pulses.  Abdomen: Soft, nontender, no hepatosplenomegaly, no distention.  Skin: Intact without lesions or rashes.  Neurologic: Alert and oriented x 3.  Psych: Normal affect. Extremities: No clubbing or cyanosis.  HEENT: Normal.    EKG NSR 84 bpm, LAD  ASSESSMENT & PLAN:  1) Chronic systolic HF: Nonischemic cardiomyopathy s/p [employer] ICD. Narrow QRS so not CRT candidate. Echo 06/2014 30-35% with diffuse hypokinesis. Most recent echo in 5/18 with EF improved to 50-55%. NYHA class I-II symptoms.  He is not volume overloaded on exam.  Creatinine was up to 2.27 in setting of kidney stone earlier this month, no repeat.   - Continue coreg 25 mg BID.  - For now, continue current Entresto, spironolactone, and Lasix.  Need to send BMET today.  If creatinine is still elevated, will need to cut back on these meds for the time being. 2) Non-obstructive CAD: No CP. Continue atorvastatin 80 mg daily.  Good lipids this year.  3) Atrial fibrillation: Paroxysmal, NSR today.  - Remains on Eliquis 5 mg BID.  4) ?OSA: He needs to start using his CPAP regularly.  5) Polysubstance abuse: Has been drug  free since 06/2013.  6) Stroke: In 5/15, likely related to atrial fibrillation. Now on Eliquis.  7) Nephrolithiasis with AKI: No longer having back pain but has not seen a stone pass.   - Refer to urology.  - Check BMET today. If creatinine is up still, will need to adjust meds.   Followup in 4 months.   Loralie Champagne, MD  10/15/2016

## 2016-10-16 ENCOUNTER — Telehealth (HOSPITAL_COMMUNITY): Payer: Self-pay | Admitting: *Deleted

## 2016-10-16 DIAGNOSIS — N289 Disorder of kidney and ureter, unspecified: Secondary | ICD-10-CM

## 2016-10-16 DIAGNOSIS — N200 Calculus of kidney: Secondary | ICD-10-CM

## 2016-10-16 NOTE — Telephone Encounter (Signed)
Notes recorded by Scarlette Calico, RN on 10/16/2016 at 1:27 PM EDT Katie, paramedic when to pt's home, we had his phone number wrong in chart, phone number now updated and correct. He is aware and agreeable to med changes, will work on getting pt in with urology, repeat lab sch for Friday ------  Notes recorded by Scarlette Calico, RN on 10/16/2016 at 12:34 PM EDT Still unable to reach pt, have contacted [facility] they will go to pt's house soon to check on him ------  Notes recorded by Scarlette Calico, RN on 10/16/2016 at 11:34 AM EDT Have attempted to call pt several times with no answer, there is no VM, line just goes dead after multiple rings, have also attempted to call emergency contact listed and was hung up on. ------  Notes recorded by Scarlette Calico, RN on 10/16/2016 at 8:27 AM EDT Attempted to call pt, no answer and no VM will continue to try and reach pt. ------  Notes recorded by Larey Dresser, MD on 10/15/2016 at 11:45 PM EDT Call patient asap. He needs to stop Entresto, spironolactone, and Lasix. He needs appointment with urology ASAP.

## 2016-10-21 ENCOUNTER — Ambulatory Visit (HOSPITAL_COMMUNITY)
Admission: RE | Admit: 2016-10-21 | Discharge: 2016-10-21 | Disposition: A | Discharge status: Home / Self Care | Payer: Medicaid Other | Source: Ambulatory Visit | Attending: Cardiology | Admitting: Cardiology

## 2016-10-21 ENCOUNTER — Telehealth (HOSPITAL_COMMUNITY): Payer: Self-pay | Admitting: *Deleted

## 2016-10-21 DIAGNOSIS — N289 Disorder of kidney and ureter, unspecified: Secondary | ICD-10-CM

## 2016-10-21 LAB — BASIC METABOLIC PANEL
ANION GAP: 4 — AB (ref 5–15)
BUN: 13 mg/dL (ref 6–20)
CHLORIDE: 111 mmol/L (ref 101–111)
CO2: 25 mmol/L (ref 22–32)
Calcium: 8.8 mg/dL — ABNORMAL LOW (ref 8.9–10.3)
Creatinine, Ser: 1.19 mg/dL (ref 0.61–1.24)
GFR calc non Af Amer: >60 mL/min (ref >60)
Glucose, Bld: 125 mg/dL — ABNORMAL HIGH (ref 65–99)
Potassium: 4.4 mmol/L (ref 3.5–5.1)
Sodium: 140 mmol/L (ref 135–145)

## 2016-10-21 NOTE — Telephone Encounter (Signed)
-----   Message from Larey Dresser, MD sent at 10/21/2016 11:38 AM EDT ----- He needs followup in the office, can be with NP/PA, to get back on his meds after resolution of AKI.  I would let him start back on his spironolactone today. BMET 10 days.

## 2016-10-21 NOTE — Telephone Encounter (Signed)
Notes recorded by Scarlette Calico, RN on 10/21/2016 at 4:05 PM EDT Pt aware, agreeable and voiced understanding, f/u appt sch for 8/28

## 2016-10-24 ENCOUNTER — Encounter (HOSPITAL_COMMUNITY): Payer: Self-pay

## 2016-10-24 ENCOUNTER — Emergency Department (HOSPITAL_COMMUNITY)
Admission: EM | Admit: 2016-10-24 | Discharge: 2016-10-24 | Disposition: A | Discharge status: Home / Self Care | Payer: Medicaid Other | Attending: Emergency Medicine | Admitting: Emergency Medicine

## 2016-10-24 DIAGNOSIS — I5022 Chronic systolic (congestive) heart failure: Secondary | ICD-10-CM | POA: Insufficient documentation

## 2016-10-24 DIAGNOSIS — Z794 Long term (current) use of insulin: Secondary | ICD-10-CM | POA: Insufficient documentation

## 2016-10-24 DIAGNOSIS — I2510 Atherosclerotic heart disease of native coronary artery without angina pectoris: Secondary | ICD-10-CM | POA: Diagnosis not present

## 2016-10-24 DIAGNOSIS — I110 Hypertensive heart disease with heart failure: Secondary | ICD-10-CM | POA: Diagnosis not present

## 2016-10-24 DIAGNOSIS — L0100 Impetigo, unspecified: Secondary | ICD-10-CM | POA: Insufficient documentation

## 2016-10-24 DIAGNOSIS — Z79899 Other long term (current) drug therapy: Secondary | ICD-10-CM | POA: Diagnosis not present

## 2016-10-24 DIAGNOSIS — E119 Type 2 diabetes mellitus without complications: Secondary | ICD-10-CM | POA: Diagnosis not present

## 2016-10-24 DIAGNOSIS — F17210 Nicotine dependence, cigarettes, uncomplicated: Secondary | ICD-10-CM | POA: Insufficient documentation

## 2016-10-24 DIAGNOSIS — R21 Rash and other nonspecific skin eruption: Secondary | ICD-10-CM | POA: Diagnosis present

## 2016-10-24 MED ORDER — MUPIROCIN CALCIUM 2 % EX CREA: 1.0000 "application " | TOPICAL_CREAM | Freq: Two times a day (BID) | CUTANEOUS | 0 refills | Status: DC

## 2016-10-24 NOTE — Discharge Instructions (Signed)
You can apply the Bactroban cream to affected skin areas 3 times daily for the next 5 days. Please do not use this cream around the eyes or inside the nose.  It is important that you wash her hands with soap and water every time you touch your face. Please make sure to keep the skin on the face clean by washing it with soap and water at least twice daily. This is a bacterial skin infection so it is possible to transfer the bacteria to other places on her body if you scratch or touch your face without washing your hands.  Please follow-up with primary care to have this rash rechecked within the next week. Your blood pressure was also high today so please have primary care recheck your blood pressure during your next visit.  If the rash worsens despite using Bactroban, or if you develop new or worsening symptoms including fever, or chills, please return to the emergency department for re-evaluation.

## 2016-10-24 NOTE — ED Triage Notes (Signed)
Patient presents with rash to face, starting 8/19. Patient reports the rash started over his right eye and started draining yellowish drainage into his left eye. Patient then reports the rash spread to his right cheek, nose, and left ear. Patient reports the rash "crusts with brownish stuff." Patient reports the rash is "a little" itchy, but denies pain. Patient denies coming in contact with any allergens or being around anyone with a rash. Patient with clear airway and speaking in full sentences.

## 2016-10-24 NOTE — ED Provider Notes (Signed)
[facility] DEPT Provider Note   CSN: [id number] Arrival date & time: 10/24/16  1524     History   Chief Complaint Chief Complaint  Patient presents with  . Rash    HPI Joseph [name] is a 51 y.o. male ho presents to the emergency department with a chief complaint of rash. He reports the rash began 6 days ago around his mouth and near his nose and has since spread to his cheek, forehead, and left posterior ear. He reports that the rash was somewhat itchy, but has since improved. He reports that he thought the rash initially was a fever blister, but became concerned after spread. He states that the rash initially had small fluid-filled blisters, but he applied hydrogen peroxide and alcohol, and now the areas have started developing yellow crusting. No other treatment prior to arrival. No new soaps, lotions, or cleaning products. No sick contacts.  The history is provided by the patient. No language interpreter was used.    Past Medical History:  Diagnosis Date  . Automatic implantable cardioverter-defibrillator in situ 12/01/2013   SINGLE CHAMBER BY DR Olin Pia  . CAD (coronary artery disease)    a. 06/28/13 non obst dz. managed medically  . CHF (congestive heart failure) ([place])   . High cholesterol   . Hypertension   . Non-ischemic cardiomyopathy ([place])    a. 06/28/13 ECHO: EF 25% and global hypokinesis and cath wuth EF 15%   . Pneumonia ?2004  . Shortness of breath   . Stroke ([place])   . Type II diabetes mellitus [facility])     Patient Active Problem List   Diagnosis Date Noted  . ICD (implantable cardioverter-defibrillator), single, in situ 07/04/2016  . OSA on CPAP 08/22/2014  . Snoring 02/17/2014  . Other primary cardiomyopathies 12/01/2013  . Chronic systolic heart failure ([place]) 07/21/2013  . Polysubstance abuse 07/21/2013  . Smoking 07/21/2013  . Long-term insulin use in type 2 diabetes ([place]) 07/07/2013  . HTN (hypertension) 07/07/2013  . Stroke ([place]) 07/04/2013  .  HFrEF (heart failure with reduced ejection fraction) ([place]) 07/02/2013    Class: Diagnosis of    Past Surgical History:  Procedure Laterality Date  . COLONOSCOPY WITH PROPOFOL N/A 07/08/2014   Procedure: COLONOSCOPY WITH PROPOFOL;  Surgeon: Carol Ada, MD;  Location: [facility] ENDOSCOPY;  Service: Endoscopy;  Laterality: N/A;  . EP IMPLANTABLE DEVICE  12/01/2013   single chamber by dr Caryl Comes  . FINGER AMPUTATION Right 1987   "reattached middle and ring fingers"  . IMPLANTABLE CARDIOVERTER DEFIBRILLATOR IMPLANT N/A 12/01/2013   Procedure: IMPLANTABLE CARDIOVERTER DEFIBRILLATOR IMPLANT;  Surgeon: Deboraha Sprang, MD;  Location: [facility] CATH LAB;  Service: Cardiovascular;  Laterality: N/A;  . LEFT HEART CATHETERIZATION WITH CORONARY ANGIOGRAM N/A 06/30/2013   Procedure: LEFT HEART CATHETERIZATION WITH CORONARY ANGIOGRAM;  Surgeon: Troy Sine, MD;  Location: [facility] CATH LAB;  Service: Cardiovascular;  Laterality: N/A;  . TEE WITHOUT CARDIOVERSION N/A 07/06/2013   Procedure: TRANSESOPHAGEAL ECHOCARDIOGRAM (TEE);  Surgeon: [name], MD;  Location: [facility] ENDOSCOPY;  Service: Cardiovascular;  Laterality: N/A;       Home Medications    Prior to Admission medications   Medication Sig Start Date End Date Taking? Authorizing Provider  apixaban (ELIQUIS) 5 MG TABS tablet Take 1 tablet (5 mg total) by mouth 2 (two) times daily. 11/02/14   Deboraha Sprang, MD  atorvastatin (LIPITOR) 80 MG tablet TAKE 1 TABLET BY MOUTH EVERY EVENING 02/02/16   Larey Dresser, MD  carvedilol (  COREG) 25 MG tablet TAKE 1 TABLET BY MOUTH 2 TIMES DAILY WITH A MEAL. 01/01/16   Clegg, Amy D, NP  diphenhydrAMINE (BENADRYL) 25 MG tablet Take 1 tablet (25 mg total) by mouth every 6 (six) hours. Patient not taking: Reported on 08/06/2016 06/13/14   Delos Haring, PA-C  glucose blood (ACCU-CHEK AVIVA) test strip Use as instructed 09/27/16   Diallo, Earna Coder, MD  HYDROcodone-acetaminophen (NORCO) 5-325 MG tablet Take 1 tablet by mouth every 6  (six) hours as needed for moderate pain. 10/09/16   Ward, Ozella Almond, PA-C  insulin aspart (NOVOLOG FLEXPEN) 100 UNIT/ML FlexPen Inject 15-20 Units into the skin 3 (three) times daily with meals. Take 15 if CBGs 80-199 and 20 units if > 200 09/27/16   Diallo, Earna Coder, MD  Insulin Glargine (LANTUS SOLOSTAR) 100 UNIT/ML Solostar Pen Inject 36 Units into the skin daily with breakfast. 09/27/16   Diallo, Abdoulaye, MD  Insulin Syringes, Disposable, U-100 0.3 ML MISC 1 each by Does not apply route daily. 05/09/14   Olam Idler, MD  metFORMIN (GLUCOPHAGE) 1000 MG tablet Take 1,000 mg by mouth 2 (two) times daily with a meal.    [provider]  mupirocin cream (BACTROBAN) 2 % Apply 1 application topically 2 (two) times daily. 10/24/16   Shaina Gullatt A, PA-C  ReliOn Ultra Thin Lancets MISC Patient will need to test his blood glucose once daily prior to breakfast and metformin and amaryl administration. 07/07/13   Olam Idler, MD  spironolactone (ALDACTONE) 25 MG tablet TAKE ONE TABLET BY MOUTH EVERY MORNING 04/05/16   Bensimhon, Shaune Pascal, MD    Family History History reviewed. No pertinent family history.  Social History Social History  Substance Use Topics  . Smoking status: Current Some Day Smoker    Packs/day: 0.50    Years: 31.00    Types: Cigarettes  . Smokeless tobacco: Never Used     Comment: Quit for 1 month in past - previous 1 ppd   . Alcohol use 19.2 oz/week    21 Cans of beer, 11 Shots of liquor per week     Comment: 06/29/2013 "2-3 beers/day; maybe 1 pint/wk"     Allergies   Tape   Review of Systems Review of Systems  Constitutional: Negative for activity change, chills and fever.  Respiratory: Negative for shortness of breath.   Cardiovascular: Negative for chest pain.  Gastrointestinal: Negative for abdominal pain.  Musculoskeletal: Negative for back pain.  Skin: Positive for rash.     Physical Exam Updated Vital Signs BP (!) 171/101 (BP Location: Right  Arm)   Pulse 82   Temp 98.4 F (36.9 C) (Oral)   Resp 18   Ht 5\' 5"  (1.651 m)   Wt 90.7 kg (200 lb)   SpO2 98%   BMI 33.28 kg/m   Physical Exam  Constitutional: He appears well-developed.  HENT:  Head: Normocephalic.  Eyes: Conjunctivae are normal.  Neck: Neck supple.  Cardiovascular: Normal rate and regular rhythm.   No murmur heard. Pulmonary/Chest: Effort normal.  Abdominal: Soft. He exhibits no distension.  Neurological: He is alert.  Skin: Skin is warm and dry.  Honey-crusted plaques to the face. Small plaque noted to the left posterior ear with scaling. No surrounding warmth or edema to the skin.See picture below.   Psychiatric: His behavior is normal.  Nursing note and vitals reviewed.      ED Treatments / Results  Labs (all labs ordered are listed, but only abnormal results are  displayed) Labs Reviewed - No data to display  EKG  EKG Interpretation None       Radiology No results found.  Procedures Procedures (including critical care time)  Medications Ordered in ED Medications - No data to display   Initial Impression / Assessment and Plan / ED Course  I have reviewed the triage vital signs and the nursing notes.  Pertinent labs & imaging results that were available during my care of the patient were reviewed by me and considered in my medical decision making (see chart for details).     51 year old male presenting with impetigo. No evidence of ecthyma. No fever or chills. The rash is limited to the face and left posterior ear. We will discharge the patient with a prescription of Bactroban. Discussed precautions to not use the medication around the eyes or on the mucosal surfaces. Educated the patient on good hand hygiene since this is a bacterial infection. Recommended rash recheck in one week with primary care. Strict return precautions given. No acute distress. Patient is safe for discharge at this time.  Final Clinical Impressions(s) / ED  Diagnoses   Final diagnoses:  Impetigo    New Prescriptions New Prescriptions   MUPIROCIN CREAM (BACTROBAN) 2 %    Apply 1 application topically 2 (two) times daily.     Joanne Gavel, PA-C 10/24/16 1819    Fatima Blank, MD 10/25/16 [phone]

## 2016-10-29 ENCOUNTER — Encounter (HOSPITAL_COMMUNITY): Payer: Medicaid Other

## 2016-10-30 ENCOUNTER — Ambulatory Visit (HOSPITAL_COMMUNITY)
Admission: EM | Admit: 2016-10-30 | Discharge: 2016-10-30 | Disposition: A | Discharge status: Home / Self Care | Payer: Medicaid Other | Attending: Family Medicine | Admitting: Family Medicine

## 2016-10-30 ENCOUNTER — Ambulatory Visit (HOSPITAL_COMMUNITY)
Admission: RE | Admit: 2016-10-30 | Discharge: 2016-10-30 | Disposition: A | Discharge status: Home / Self Care | Payer: Medicaid Other | Source: Ambulatory Visit | Attending: Internal Medicine | Admitting: Internal Medicine

## 2016-10-30 ENCOUNTER — Encounter (HOSPITAL_COMMUNITY): Payer: Self-pay | Admitting: Family Medicine

## 2016-10-30 ENCOUNTER — Encounter (HOSPITAL_COMMUNITY): Payer: Self-pay

## 2016-10-30 VITALS — BP 152/90 | HR 81 | Wt 201.2 lb

## 2016-10-30 DIAGNOSIS — I2510 Atherosclerotic heart disease of native coronary artery without angina pectoris: Secondary | ICD-10-CM | POA: Diagnosis not present

## 2016-10-30 DIAGNOSIS — Z7901 Long term (current) use of anticoagulants: Secondary | ICD-10-CM | POA: Diagnosis not present

## 2016-10-30 DIAGNOSIS — Z794 Long term (current) use of insulin: Secondary | ICD-10-CM | POA: Diagnosis not present

## 2016-10-30 DIAGNOSIS — Z9989 Dependence on other enabling machines and devices: Secondary | ICD-10-CM | POA: Diagnosis not present

## 2016-10-30 DIAGNOSIS — N200 Calculus of kidney: Secondary | ICD-10-CM

## 2016-10-30 DIAGNOSIS — Z87442 Personal history of urinary calculi: Secondary | ICD-10-CM | POA: Insufficient documentation

## 2016-10-30 DIAGNOSIS — E119 Type 2 diabetes mellitus without complications: Secondary | ICD-10-CM | POA: Insufficient documentation

## 2016-10-30 DIAGNOSIS — Z8673 Personal history of transient ischemic attack (TIA), and cerebral infarction without residual deficits: Secondary | ICD-10-CM | POA: Insufficient documentation

## 2016-10-30 DIAGNOSIS — I480 Paroxysmal atrial fibrillation: Secondary | ICD-10-CM

## 2016-10-30 DIAGNOSIS — I428 Other cardiomyopathies: Secondary | ICD-10-CM | POA: Diagnosis not present

## 2016-10-30 DIAGNOSIS — I110 Hypertensive heart disease with heart failure: Secondary | ICD-10-CM | POA: Insufficient documentation

## 2016-10-30 DIAGNOSIS — F17210 Nicotine dependence, cigarettes, uncomplicated: Secondary | ICD-10-CM | POA: Diagnosis not present

## 2016-10-30 DIAGNOSIS — I5022 Chronic systolic (congestive) heart failure: Secondary | ICD-10-CM | POA: Insufficient documentation

## 2016-10-30 DIAGNOSIS — G4733 Obstructive sleep apnea (adult) (pediatric): Secondary | ICD-10-CM

## 2016-10-30 DIAGNOSIS — L255 Unspecified contact dermatitis due to plants, except food: Secondary | ICD-10-CM

## 2016-10-30 DIAGNOSIS — N179 Acute kidney failure, unspecified: Secondary | ICD-10-CM | POA: Insufficient documentation

## 2016-10-30 DIAGNOSIS — L237 Allergic contact dermatitis due to plants, except food: Secondary | ICD-10-CM | POA: Diagnosis not present

## 2016-10-30 LAB — BASIC METABOLIC PANEL
Anion gap: 4 — ABNORMAL LOW (ref 5–15)
BUN: 9 mg/dL (ref 6–20)
CHLORIDE: 113 mmol/L — AB (ref 101–111)
CO2: 23 mmol/L (ref 22–32)
CREATININE: 0.93 mg/dL (ref 0.61–1.24)
Calcium: 9.2 mg/dL (ref 8.9–10.3)
GFR calc Af Amer: >60 mL/min (ref >60)
GFR calc non Af Amer: >60 mL/min (ref >60)
GLUCOSE: 132 mg/dL — AB (ref 65–99)
Potassium: 4.3 mmol/L (ref 3.5–5.1)
Sodium: 140 mmol/L (ref 135–145)

## 2016-10-30 MED ORDER — FUROSEMIDE 40 MG PO TABS: 40.0000 mg | ORAL_TABLET | ORAL | 3 refills | Status: DC

## 2016-10-30 MED ORDER — SACUBITRIL-VALSARTAN 24-26 MG PO TABS: 1.0000 | ORAL_TABLET | Freq: Two times a day (BID) | ORAL | 3 refills | Status: DC

## 2016-10-30 MED ORDER — SPIRONOLACTONE 25 MG PO TABS: 25.0000 mg | ORAL_TABLET | Freq: Every day | ORAL | 3 refills | Status: DC

## 2016-10-30 MED ORDER — TRIAMCINOLONE ACETONIDE 0.1 % EX CREA: 1.0000 "application " | TOPICAL_CREAM | Freq: Two times a day (BID) | CUTANEOUS | 0 refills | Status: DC

## 2016-10-30 NOTE — ED Triage Notes (Signed)
Pt here for rash to left hand in between fingers. sts that the rash started on his face but now has spread. sts itchy and burning.

## 2016-10-30 NOTE — ED Provider Notes (Signed)
  Joseph [name]   [id number] 10/30/16 Arrival Time: 2563  ASSESSMENT & PLAN:  1. Rhus dermatitis     Meds ordered this encounter  Medications  . triamcinolone cream (KENALOG) 0.1 %    Sig: Apply 1 application topically 2 (two) times daily.    Dispense:  15 g    Refill:  0   Has f/u with PCP soon. May f/u here as needed. No sign of infection. Reviewed expectations re: course of current medical issues. Questions answered. Outlined signs and symptoms indicating need for more acute intervention. Patient verbalized understanding. After Visit Summary given.   SUBJECTIVE:  Joseph [name] is a 51 y.o. male who presents with complaint of "rash" between 3rd and 4th fingers. Present for approx 2 weeks. Initially on face but that has resolved. Much itching. No worsening of rash. Mupirocin ointment applied at home without help. Afebrile. No persistent exposures to chemicals.  ROS: As per HPI.   OBJECTIVE:  Vitals:   10/30/16 1241  BP: (!) 139/95  Pulse: 86  Resp: 18  Temp: 98.6 F (37 C)  SpO2: 100%    General appearance: alert; no distress Skin: between 3rd and 4th fingers areas of linear papules and vesicles with surrounding erythema consistent with rhus/contact dermatitis Psychological: alert and cooperative; normal mood and affect   Allergies  Allergen Reactions  . Tape Rash    Occurred with Nicotine patch - limited rash.     Past Medical History:  Diagnosis Date  . Automatic implantable cardioverter-defibrillator in situ 12/01/2013   SINGLE CHAMBER BY DR Olin Pia  . CAD (coronary artery disease)    a. 06/28/13 non obst dz. managed medically  . CHF (congestive heart failure) ([place])   . High cholesterol   . Hypertension   . Non-ischemic cardiomyopathy ([place])    a. 06/28/13 ECHO: EF 25% and global hypokinesis and cath wuth EF 15%   . Pneumonia ?2004  . Shortness of breath   . Stroke ([place])   . Type II diabetes mellitus ([place])        Vanessa Kick,  MD 10/30/16 1304

## 2016-10-30 NOTE — Progress Notes (Signed)
Patient ID: Joseph [name], male   DOB: [birth year], 51 y.o.   MRN: [id number]     Advanced Heart Failure Clinic Note   PCP: Dr. Andy Gauss EP: Dr Caryl Comes  HF: Dr. Aundra Dubin   HPI: Joseph [name] is a 51 y.o. male with history of chronic systolic HF (EF previously 30-35%, now improved to 50-55%), nonischemic cardiomyopathy, HTN, TIA, stroke, DM2, paroxysmal atrial fibrillation tobacco abuse and polysubstance abuse.   Patient had atrial fibrillation noted on ICD interrogation and started apixaban in 1/16.   He was seen by Dr. Aundra Dubin in follow up 2 weeks ago. Recently was treated for obstructing kidney stones, had AKI creatinine 4.07. Dr. Aundra Dubin stopped his Mearl Latin and Lasix. Repeat BMET showed renal function back to baseline. He feels well today, denies SOB. Weights at home stable. Says that his kidney stones have passed.    Labs (5/18): K 4.5, creatinine 1.0 Labs (8/18): hgb 13.3, LDL 51, K 3.9, creatinine 2.27  ECG (personally reviewed): NSR, inferior Qs, poor RWP  SH: No drugs as of 06/28/13. Smokes 1/2 ppd.  No ETOH.   FH: Mother: DM2, living        Father deceased, no issues  Review of systems complete and found to be negative unless listed in HPI.    PMH: 1) HTN 2) NICM: LHC (06/2013): Mild coronary obstructive disease with 30% narrowing in the mid AV groove circumflex, and 30% proximal and 40% mid RCA stenoses with otherwise normal LAD and ramus intermediate vessel.  RHC (06/2013) RA: 6, RV: 26/9, PA: 26/13, PC: a 18 mean 13, CO/CI Fick: 3.7/2.0, left ventriculography EF 15%; ECHO (06/2013) EF 20%, diffuse HK, mild MR; TEE (07/2013) no thrombus; ECHO (10/2013): EF 20-25%, diff HK, LA mod dilated; [employer] ICD implanted (12/01/13).  Echo (4/16) with EF 30-35%, diffuse hypokinesis, normal RV.  - Echo (5/18) with EF 50-55%, mild LVH.  3) DM2 4) Stroke: MRI (07/2013) revealed an acute infarction, with a greater than 3 cm infarct involving left frontal opercular cortex and subcortical  white matter. Carotid dopplers (07/2013) mild soft plaque origin ICA. 1-39% ICA stenosis. Vertebral artery flow is antegrade 5) Polysubstance abuse: + cocaine UDS in past.  6) Atrial fibrillation: Paroxysmal, started Eliquis in 1/16.  7) Nephrolithiasis with AKI   Current Outpatient Prescriptions  Medication Sig Dispense Refill  . apixaban (ELIQUIS) 5 MG TABS tablet Take 1 tablet (5 mg total) by mouth 2 (two) times daily. 180 tablet 0  . atorvastatin (LIPITOR) 80 MG tablet TAKE 1 TABLET BY MOUTH EVERY EVENING 90 tablet 3  . carvedilol (COREG) 25 MG tablet TAKE 1 TABLET BY MOUTH 2 TIMES DAILY WITH A MEAL. 60 tablet 3  . glucose blood (ACCU-CHEK AVIVA) test strip Use as instructed 100 each 12  . HYDROcodone-acetaminophen (NORCO) 5-325 MG tablet Take 1 tablet by mouth every 6 (six) hours as needed for moderate pain. 12 tablet 0  . insulin aspart (NOVOLOG FLEXPEN) 100 UNIT/ML FlexPen Inject 15-20 Units into the skin 3 (three) times daily with meals. Take 15 if CBGs 80-199 and 20 units if > 200 15 mL 11  . Insulin Glargine (LANTUS SOLOSTAR) 100 UNIT/ML Solostar Pen Inject 35 Units into the skin daily at 10 pm.    . Insulin Syringes, Disposable, U-100 0.3 ML MISC 1 each by Does not apply route daily. 100 each 1  . metFORMIN (GLUCOPHAGE) 1000 MG tablet Take 1,000 mg by mouth 2 (two) times daily with a meal.    .  mupirocin cream (BACTROBAN) 2 % Apply 1 application topically 2 (two) times daily. 15 g 0  . ReliOn Ultra Thin Lancets MISC Patient will need to test his blood glucose once daily prior to breakfast and metformin and amaryl administration. 100 each 2  . diphenhydrAMINE (BENADRYL) 25 MG tablet Take 1 tablet (25 mg total) by mouth every 6 (six) hours. (Patient not taking: Reported on 08/06/2016) 20 tablet 0   No current facility-administered medications for this encounter.      Vitals:   10/30/16 1048  BP: (!) 152/90  Pulse: 81  SpO2: 99%  Weight: 201 lb 3.2 oz (91.3 kg)   Wt Readings  from Last 3 Encounters:  10/30/16 201 lb 3.2 oz (91.3 kg)  10/24/16 200 lb (90.7 kg)  10/15/16 198 lb 12.8 oz (90.2 kg)     PHYSICAL EXAM: General: NAD Neck: Thick, no JVD, no thyromegaly or thyroid nodule.  Lungs: Clear to auscultation bilaterally with normal respiratory effort. CV: Nondisplaced PMI.  Heart regular S1/S2, no S3/S4, no murmur.  No peripheral edema.  No carotid bruit.  Normal pedal pulses.  Abdomen: Soft, nontender, no hepatosplenomegaly, no distention.  Skin: Intact without lesions or rashes.  Neurologic: Alert and oriented x 3.  Psych: Normal affect. Extremities: No clubbing or cyanosis.  HEENT: Normal.      ASSESSMENT & PLAN:  1) Chronic systolic HF: Nonischemic cardiomyopathy s/p [employer] ICD. Narrow QRS so not CRT candidate. Echo 06/2014 30-35% with diffuse hypokinesis. Most recent echo in 5/18 with EF improved to 50-55%.  - NYHA II - Continue Coreg 25 mg BID.  - Restart Spiro 25 mg daily - Restart Entresto at 24/26 mg BID.  - Restart Lasix 40 mg every other day.   2) Non-obstructive CAD:  - Denies chest pain.  - Continue atorvastatin.  - No ASA with need for Eliquis.    3) Atrial fibrillation:  - NSR by exam today.  - Continue Eliquis 5 mg BID.   4) ?OSA:  - Compliant with CPAP   5) Polysubstance abuse: Has been drug free since 06/2013.  - Remains drug free   6) Stroke: In 5/15, likely related to atrial fibrillation.  - No residual deficits.   7) Nephrolithiasis with AKI:  - Resolved. Follows with Urology.   Follow up in 2 weeks to see PharmD for further medication titration. BMET today. Follow up in one month.    Arbutus Leas, NP  10/30/2016

## 2016-10-30 NOTE — Patient Instructions (Signed)
Labs today (will call for abnormal results, otherwise no news is good news)  START Entresto 24-26 mg (1 Tablet) Twice Daily- samples provided  RESTART Spironolactone 25 mg (1 Tablet) Once Daily  RESTART Lasix 40 mg (1 Tablet) Every other Day  Follow up with Doroteo Bradford, Pharmd in 2 weeks  Follow up in 1 Month

## 2016-10-30 NOTE — Progress Notes (Signed)
Advanced Heart Failure Medication Review by a Pharmacist  Does the patient  feel that his/her medications are working for him/her?  yes  Has the patient been experiencing any side effects to the medications prescribed?  no  Does the patient measure his/her own blood pressure or blood glucose at home?  yes   Does the patient have any problems obtaining medications due to transportation or finances?   no  Understanding of regimen: fair Understanding of indications: fair Potential of compliance: fair Patient understands to avoid NSAIDs. Patient understands to avoid decongestants.  Issues to address at subsequent visits: None   Pharmacist comments: Joseph [name] is a pleasant 51 yo M presenting with his medication bottles. He reports good compliance with his regimen but states that he was told by [place] to hold his spironolactone 25 mg daily, furosemide 40 mg QOD and Entresto 97-103 mg BID last week. He did not have any other medication-related questions or concerns for me at this time.   [name], PharmD, BCPS, CPP Clinical Pharmacist Pager: [phone] Phone: [phone] 10/30/2016 11:07 AM      Time with patient: 10 minutes Preparation and documentation time: 2 minutes Total time: 12 minutes

## 2016-11-06 ENCOUNTER — Ambulatory Visit (INDEPENDENT_AMBULATORY_CARE_PROVIDER_SITE_OTHER): Payer: Medicaid Other | Admitting: Family Medicine

## 2016-11-06 ENCOUNTER — Encounter: Payer: Self-pay | Admitting: Family Medicine

## 2016-11-06 VITALS — BP 110/64 | HR 93 | Temp 98.2°F | Wt 200.0 lb

## 2016-11-06 DIAGNOSIS — E118 Type 2 diabetes mellitus with unspecified complications: Secondary | ICD-10-CM | POA: Diagnosis not present

## 2016-11-06 DIAGNOSIS — Z23 Encounter for immunization: Secondary | ICD-10-CM | POA: Diagnosis not present

## 2016-11-06 DIAGNOSIS — Z794 Long term (current) use of insulin: Secondary | ICD-10-CM | POA: Diagnosis not present

## 2016-11-06 LAB — POCT GLYCOSYLATED HEMOGLOBIN (HGB A1C): HEMOGLOBIN A1C: 7

## 2016-11-06 MED ORDER — INSULIN GLARGINE 100 UNIT/ML SOLOSTAR PEN: 35.0000 [IU] | PEN_INJECTOR | Freq: Every day | SUBCUTANEOUS | 2 refills | Status: DC

## 2016-11-06 MED ORDER — INSULIN ASPART 100 UNIT/ML FLEXPEN: 15.0000 [IU] | PEN_INJECTOR | Freq: Three times a day (TID) | SUBCUTANEOUS | 11 refills | Status: DC

## 2016-11-06 MED ORDER — GLUCOSE BLOOD VI STRP: ORAL_STRIP | 12 refills | Status: DC

## 2016-11-06 MED ORDER — METFORMIN HCL 1000 MG PO TABS: 1000.0000 mg | ORAL_TABLET | Freq: Two times a day (BID) | ORAL | 2 refills | Status: DC

## 2016-11-06 NOTE — Patient Instructions (Signed)
It was great seeing you today! We have addressed the following issues today  1. A1c is 7.0. Very good job these past few months. Keep up the good work. 2. I will refill your diabetes medications. 3. I want you to make an appointment with Dr.Koval for smoking cessation  If we did any lab work today, and the results require attention, either me or my nurse will get in touch with you. If everything is normal, you will get a letter in mail and a message via . If you don't hear from [place] in two weeks, please give [place] a call. Otherwise, we look forward to seeing you again at your next visit. If you have any questions or concerns before then, please call the clinic at [phone].  Please bring all your medications to every doctors visit  Sign up for My Chart to have easy access to your labs results, and communication with your Primary care physician. Please ask Front Desk for some assistance.   Please check-out at the front desk before leaving the clinic.    Take Care,   Dr. Andy Gauss

## 2016-11-06 NOTE — Progress Notes (Signed)
Subjective:    Patient ID: Joseph [name], male    DOB: December 17, 1965, 51 y.o.   MRN: [id number]   CC: Type 2 diabetes management  HPI: Patient is a 51 year old male who presented today to follow-up on type 2 diabetes management. Patient reports that he has been following regimen as prescribed is currently doing 35 units of Lantus at night and 15 units of NovoLog with meals. He is also on metformin 1000 mg twice a day. Patient endorses that her diet and initiation of exercise plan. Patient is also motivated to quit smoking. She denies any chest pain, abdominal pain, shortness of breath, fevers chills, nausea or vomiting.  Smoking status reviewed   ROS: all other systems were reviewed and are negative other than in the HPI   Past Medical History:  Diagnosis Date  . Automatic implantable cardioverter-defibrillator in situ 12/01/2013   SINGLE CHAMBER BY DR Olin Pia  . CAD (coronary artery disease)    a. 06/28/13 non obst dz. managed medically  . CHF (congestive heart failure) ([place])   . High cholesterol   . Hypertension   . Non-ischemic cardiomyopathy ([place])    a. 06/28/13 ECHO: EF 25% and global hypokinesis and cath wuth EF 15%   . Pneumonia ?2004  . Shortness of breath   . Stroke ([place])   . Type II diabetes mellitus ([place])     Past Surgical History:  Procedure Laterality Date  . COLONOSCOPY WITH PROPOFOL N/A 07/08/2014   Procedure: COLONOSCOPY WITH PROPOFOL;  Surgeon: Carol Ada, MD;  Location: [facility] ENDOSCOPY;  Service: Endoscopy;  Laterality: N/A;  . EP IMPLANTABLE DEVICE  12/01/2013   single chamber by dr Caryl Comes  . FINGER AMPUTATION Right 1987   "reattached middle and ring fingers"  . IMPLANTABLE CARDIOVERTER DEFIBRILLATOR IMPLANT N/A 12/01/2013   Procedure: IMPLANTABLE CARDIOVERTER DEFIBRILLATOR IMPLANT;  Surgeon: Deboraha Sprang, MD;  Location[facility] CATH LAB;  Service: Cardiovascular;  Laterality: N/A;  . LEFT HEART CATHETERIZATION WITH CORONARY ANGIOGRAM N/A 06/30/2013   Procedure: LEFT  HEART CATHETERIZATION WITH CORONARY ANGIOGRAM;  Surgeon: Troy Sine, MD;  Location: [facility] CATH LAB;  Service: Cardiovascular;  Laterality: N/A;  . TEE WITHOUT CARDIOVERSION N/A 07/06/2013   Procedure: TRANSESOPHAGEAL ECHOCARDIOGRAM (TEE);  Surgeon: [name], MD;  Location: [facility] ENDOSCOPY;  Service: Cardiovascular;  Laterality: N/A;    Past medical history, surgical, family, and social history reviewed and updated in the EMR as appropriate.  Objective:  BP 110/64   Pulse 93   Temp 98.2 F (36.8 C) (Oral)   Wt 200 lb (90.7 kg)   SpO2 96%   BMI 33.28 kg/m   Vitals and nursing note reviewed  General: NAD, pleasant, able to participate in exam Cardiac: RRR, normal heart sounds, no murmurs. 2+ radial and PT pulses bilaterally Respiratory: CTAB, normal effort, No wheezes, rales or rhonchi Abdomen: soft, nontender, nondistended, no hepatic or splenomegaly, +BS Extremities: no edema or cyanosis. WWP. Skin: warm and dry, no rashes noted Neuro: alert and oriented x4, no focal deficits Psych: Normal affect and mood   Assessment & Plan:   #T2DM Management Today the patient's A1c is 7.0 down from 8.2 which is significant improvement. Would not change current regimen. Patient seems to be motivated about improving his health. Will refill medications for diabetes as requested. Continue current treatment plan. Encourage patient to keep up the good work. --Patient will make appointment for smoking cessation with Dr. Valentina Lucks. --Patient will follow-up in 3 months or sooner as needed.  Marjie Skiff, MD [place] PGY-2

## 2016-11-07 ENCOUNTER — Telehealth: Payer: Self-pay | Admitting: Family Medicine

## 2016-11-07 DIAGNOSIS — Z794 Long term (current) use of insulin: Principal | ICD-10-CM

## 2016-11-07 DIAGNOSIS — E118 Type 2 diabetes mellitus with unspecified complications: Secondary | ICD-10-CM

## 2016-11-07 MED ORDER — METFORMIN HCL 1000 MG PO TABS: 1000.0000 mg | ORAL_TABLET | Freq: Two times a day (BID) | ORAL | 2 refills | Status: DC

## 2016-11-07 MED ORDER — GLUCOSE BLOOD VI STRP: ORAL_STRIP | 12 refills | Status: DC

## 2016-11-07 MED ORDER — INSULIN ASPART 100 UNIT/ML FLEXPEN: 15.0000 [IU] | PEN_INJECTOR | Freq: Three times a day (TID) | SUBCUTANEOUS | 11 refills | Status: DC

## 2016-11-07 MED ORDER — INSULIN GLARGINE 100 UNIT/ML SOLOSTAR PEN: 35.0000 [IU] | PEN_INJECTOR | Freq: Every day | SUBCUTANEOUS | 2 refills | Status: DC

## 2016-11-07 NOTE — Telephone Encounter (Signed)
Pt is calling because the prescriptions that were sent electronically have not been received by his pharmacy. Can we re-send this again please. jw

## 2016-11-07 NOTE — Telephone Encounter (Signed)
Medications reordered. Jazmin Hartsell,CMA

## 2016-11-14 ENCOUNTER — Encounter: Payer: Self-pay | Admitting: Pharmacist

## 2016-11-14 ENCOUNTER — Ambulatory Visit (INDEPENDENT_AMBULATORY_CARE_PROVIDER_SITE_OTHER): Payer: Medicaid Other | Admitting: Pharmacist

## 2016-11-14 DIAGNOSIS — F17200 Nicotine dependence, unspecified, uncomplicated: Secondary | ICD-10-CM

## 2016-11-14 MED ORDER — VARENICLINE TARTRATE 0.5 MG X 11 & 1 MG X 42 PO MISC: ORAL | 0 refills | Status: DC

## 2016-11-14 NOTE — Patient Instructions (Addendum)
Great to see you today!  Please start Chantix today with a meal.  Then continue to follow the dosing pack.   Please call us if you have any side effect that are concerning.   I will plan to follow-up by phone in 2 weeks.

## 2016-11-14 NOTE — Progress Notes (Signed)
   S:  Patient arrives ambulating without assistance. Patient arrives for evaluation/assistance with tobacco dependence.  Patient was referred on 11/07/2016.  Patient was last seen by Primary Care Provider on 11/07/2016.   Age when started using tobacco on a daily basis 13 Number of Cigarettes per day currently 1/2 ppd which has been consistent since restarting smoking in 2015 after 3 month quit.  Brand smoked PAL MAL. Estimated Nicotine Content per Cigarette (mg) > 1.  Estimated Nicotine intake per day ~10mg .   Smokes first cigarette <30 minutes after waking. Estimated Fagerstrom Score >5/10.   Most recent and longest quit attempt 2015 - tobacco free - 3 months  Medications (NRT, bupropion, varenicline) used in prior in past cessation efforts include: patches - intolerant due to topical reaction.   Rates IMPORTANCE of quitting tobacco on 1-10 scale of 7-8 Rates CONFIDENCE of quitting tobacco on 1-10 scale of 10.  Most common triggers to use tobacco include; "playing cards", being around other smokers (girlfriend smokes)  Motivation to quit: Ready to quit for his health.   A/P: Moderate Nicotine Dependence of 38years duration in a patient who is good candidate for success b/c of previous success with quitting AND current level of motivation. Initiated varenicline tx using starting month  Patient counseled on purpose, proper use, and potential adverse effects, including GI upset, and potential change in mood.  Planned quit date is 9/27  Written information provided. F/U phone call in 2 weeks.  F/U Rx Clinic Visit   Total time in face-to-face counseling 40 minutes.  Patient seen with Deirdre Pippins, PharmD, PGY2 Resident.    Marland Kitchen

## 2016-11-14 NOTE — Assessment & Plan Note (Signed)
Moderate Nicotine Dependence of 38years duration in a patient who is good candidate for success b/c of previous success with quitting AND current level of motivation. Initiated varenicline tx using starting month  Patient counseled on purpose, proper use, and potential adverse effects, including GI upset, and potential change in mood.  Planned quit date is 9/27

## 2016-11-14 NOTE — Progress Notes (Signed)
Patient ID: Joseph [name], male   DOB: 02-May-1965, 51 y.o.   MRN: [id number] Reviewed: Agree with Dr. Graylin Shiver documentation and management.

## 2016-11-20 ENCOUNTER — Ambulatory Visit (HOSPITAL_COMMUNITY)
Admission: RE | Admit: 2016-11-20 | Discharge: 2016-11-20 | Disposition: A | Discharge status: Home / Self Care | Payer: Medicaid Other | Source: Ambulatory Visit | Attending: Internal Medicine | Admitting: Internal Medicine

## 2016-11-20 ENCOUNTER — Encounter (HOSPITAL_COMMUNITY): Payer: Self-pay

## 2016-11-20 VITALS — BP 133/80 | HR 88 | Wt 203.2 lb

## 2016-11-20 DIAGNOSIS — I428 Other cardiomyopathies: Secondary | ICD-10-CM | POA: Insufficient documentation

## 2016-11-20 DIAGNOSIS — I110 Hypertensive heart disease with heart failure: Secondary | ICD-10-CM | POA: Diagnosis not present

## 2016-11-20 DIAGNOSIS — Z720 Tobacco use: Secondary | ICD-10-CM | POA: Insufficient documentation

## 2016-11-20 DIAGNOSIS — E119 Type 2 diabetes mellitus without complications: Secondary | ICD-10-CM | POA: Diagnosis not present

## 2016-11-20 DIAGNOSIS — I5022 Chronic systolic (congestive) heart failure: Secondary | ICD-10-CM | POA: Insufficient documentation

## 2016-11-20 DIAGNOSIS — I2510 Atherosclerotic heart disease of native coronary artery without angina pectoris: Secondary | ICD-10-CM | POA: Diagnosis not present

## 2016-11-20 DIAGNOSIS — F1910 Other psychoactive substance abuse, uncomplicated: Secondary | ICD-10-CM | POA: Insufficient documentation

## 2016-11-20 DIAGNOSIS — I480 Paroxysmal atrial fibrillation: Secondary | ICD-10-CM | POA: Insufficient documentation

## 2016-11-20 DIAGNOSIS — Z8673 Personal history of transient ischemic attack (TIA), and cerebral infarction without residual deficits: Secondary | ICD-10-CM | POA: Diagnosis not present

## 2016-11-20 DIAGNOSIS — N200 Calculus of kidney: Secondary | ICD-10-CM | POA: Insufficient documentation

## 2016-11-20 LAB — BASIC METABOLIC PANEL
Anion gap: 10 (ref 5–15)
BUN: 9 mg/dL (ref 6–20)
CALCIUM: 9.4 mg/dL (ref 8.9–10.3)
CHLORIDE: 107 mmol/L (ref 101–111)
CO2: 20 mmol/L — ABNORMAL LOW (ref 22–32)
CREATININE: 1.03 mg/dL (ref 0.61–1.24)
Glucose, Bld: 84 mg/dL (ref 65–99)
Potassium: 4 mmol/L (ref 3.5–5.1)
SODIUM: 137 mmol/L (ref 135–145)

## 2016-11-20 LAB — BRAIN NATRIURETIC PEPTIDE: B NATRIURETIC PEPTIDE 5: 10.2 pg/mL (ref 0.0–100.0)

## 2016-11-20 NOTE — Patient Instructions (Signed)
It was great to see you today!  Please take FUROSEMIDE 40 MG (1 tablet) TODAY AND 40 MG (1 TABLET) TWICE TOMORROW, then resume your 40 mg (1 tablet) EVERY OTHER DAY with an extra tablet as needed for weight gain >3 lb in 1 day or >5 lb in 1 week.   Blood work today. We will call you with any abnormalities.   Please keep your appointment with Darrick Grinder, NP-C on 11/28/16.

## 2016-11-20 NOTE — Progress Notes (Signed)
HF MD: [facility]  HPI:  Joseph [name] is a 51 y.o. African American male with history of chronic systolic HF (EF previously 30-35%, now improved to 50-55%), nonischemic cardiomyopathy, HTN, TIA, stroke, DM2, paroxysmal atrial fibrillation tobacco abuse and polysubstance abuse.   Patient had atrial fibrillation noted on ICD interrogation and started apixaban in 1/16.   Returns today for pharmacist-led HF medication titration. At last HF clinic visit on 10/30/16, his Entresto 24-26 mg BID was restarted along with spironolactone 25 mg daily and furosemide 40 mg QOD. States that he has been compliant with regimen, only missing one dose in the past week, but just restarted his old Entresto 97-103 mg BID dose today. Recently treated for obstructing kidney stones (AKI creatinine 4.07) which has since resolved. He feels well today, denies SOB but does have a slight cough. Weights at home stable. He states that he walks about 30 mins most days of the week but has not been able to in the past week 2/2 the weather.     . Shortness of breath/dyspnea on exertion? no  . Orthopnea/PND? no . Edema? no . Lightheadedness/dizziness? no . Daily weights at home? Yes - stable 201 lb  . Blood pressure/heart rate monitoring at home? no . Following low-sodium/fluid-restricted diet? Yes - no salty/fried foods, <2L fluid  HF Medications: Carvedilol 25 mg PO BID Furosemide 40 mg PO QOD Entresto 97/103 mg PO BID Spironolactone 25 mg PO daily   Has the patient been experiencing any side effects to the medications prescribed?  no  Does the patient have any problems obtaining medications due to transportation or finances?   No - [place] Medicaid  Understanding of regimen: fair Understanding of indications: fair Potential of compliance: fair Patient understands to avoid NSAIDs. Patient understands to avoid decongestants.    Pertinent Lab Values: . 11/20/16: Serum creatinine 1.03, BUN 9, Potassium 4.0, Sodium 137, BNP  10.2  Vital Signs: . Weight: 203 lb (dry weight: 200 lb) . Blood pressure: 133/80 mmHg  . Heart rate: 88 bpm      ReDS Vest - 11/20/16 1400      ReDS Vest   MR  No   Estimated volume prior to reading Med   Fitting Posture Standing   Height Marker Short   Ruler Value 7.5   Center Strip Shifted   ReDS Value 36       Assessment: 1. Chronicsystolic CHF (EF 04-54%>>09-81%), due to NICM. NYHA class IIsymptoms.  - Volume status slightly elevated on exam based on weight gain, increased cough and elevated vest reading at 36%  - Take additional furosemide 40 mg x 2 days, then resume 40 mg QOD with additional 40 mg for weight gain >3 lb in 1 day or >5 lb in 1 week  - Continue carvedilol 25 mg BID, Entresto 97-103 mg BID and spironolactone 25 mg daily  - Basic disease state pathophysiology, medication indication, mechanism and side effects reviewed at length with patient and he verbalized understanding 2) Non-obstructive CAD:  - Denies chest pain - Continue atorvastatin - No ASA with need for Eliquis 3) Atrial fibrillation:  - NSR  - Continue Eliquis 5 mg BID 4) ?OSA:  - Compliant with CPAP  5) Polysubstance abuse: Has been drug free since 06/2013.   - Now only smoking 6-7 cigarettes per day, down from 1/2 ppd on Chantix started last week 6) Stroke: In 5/15, likely related to atrial fibrillation.  - No residual deficits, on Eliquis and statin 7) Nephrolithiasis with  AKI:  - Resolved. Follows with Urology.   Plan: 1) Medication changes: Based on clinical presentation, vital signs and recent labs will increase furosemide to additional 40 mg x 2 days, then resume 40 mg QOD with additional PRN weight gain 2) Labs: BMET/BNP today  3) Follow-up: Joseph [name] on 11/28/16   Joseph [name] K. Joseph [name], PharmD, BCPS, CPP Clinical Pharmacist Pager: [phone] Phone: [phone] 11/20/2016 2:28 PM

## 2016-11-28 ENCOUNTER — Telehealth: Payer: Self-pay | Admitting: Pharmacist

## 2016-11-28 ENCOUNTER — Encounter (HOSPITAL_COMMUNITY): Payer: Self-pay

## 2016-11-28 ENCOUNTER — Ambulatory Visit (HOSPITAL_COMMUNITY)
Admission: RE | Admit: 2016-11-28 | Discharge: 2016-11-28 | Disposition: A | Discharge status: Home / Self Care | Payer: Medicaid Other | Source: Ambulatory Visit | Attending: Internal Medicine | Admitting: Internal Medicine

## 2016-11-28 VITALS — BP 132/96 | HR 91 | Wt 204.4 lb

## 2016-11-28 DIAGNOSIS — I110 Hypertensive heart disease with heart failure: Secondary | ICD-10-CM | POA: Insufficient documentation

## 2016-11-28 DIAGNOSIS — I480 Paroxysmal atrial fibrillation: Secondary | ICD-10-CM | POA: Insufficient documentation

## 2016-11-28 DIAGNOSIS — I2510 Atherosclerotic heart disease of native coronary artery without angina pectoris: Secondary | ICD-10-CM | POA: Diagnosis not present

## 2016-11-28 DIAGNOSIS — Z7901 Long term (current) use of anticoagulants: Secondary | ICD-10-CM | POA: Insufficient documentation

## 2016-11-28 DIAGNOSIS — F17200 Nicotine dependence, unspecified, uncomplicated: Secondary | ICD-10-CM | POA: Diagnosis not present

## 2016-11-28 DIAGNOSIS — Z87442 Personal history of urinary calculi: Secondary | ICD-10-CM | POA: Diagnosis not present

## 2016-11-28 DIAGNOSIS — I428 Other cardiomyopathies: Secondary | ICD-10-CM | POA: Diagnosis not present

## 2016-11-28 DIAGNOSIS — E119 Type 2 diabetes mellitus without complications: Secondary | ICD-10-CM | POA: Diagnosis not present

## 2016-11-28 DIAGNOSIS — Z9989 Dependence on other enabling machines and devices: Secondary | ICD-10-CM

## 2016-11-28 DIAGNOSIS — F17210 Nicotine dependence, cigarettes, uncomplicated: Secondary | ICD-10-CM | POA: Diagnosis not present

## 2016-11-28 DIAGNOSIS — Z8673 Personal history of transient ischemic attack (TIA), and cerebral infarction without residual deficits: Secondary | ICD-10-CM | POA: Insufficient documentation

## 2016-11-28 DIAGNOSIS — I5042 Chronic combined systolic (congestive) and diastolic (congestive) heart failure: Secondary | ICD-10-CM | POA: Diagnosis present

## 2016-11-28 DIAGNOSIS — F1910 Other psychoactive substance abuse, uncomplicated: Secondary | ICD-10-CM | POA: Insufficient documentation

## 2016-11-28 DIAGNOSIS — I5022 Chronic systolic (congestive) heart failure: Secondary | ICD-10-CM | POA: Diagnosis not present

## 2016-11-28 DIAGNOSIS — I429 Cardiomyopathy, unspecified: Secondary | ICD-10-CM | POA: Diagnosis not present

## 2016-11-28 DIAGNOSIS — N179 Acute kidney failure, unspecified: Secondary | ICD-10-CM | POA: Insufficient documentation

## 2016-11-28 DIAGNOSIS — G4733 Obstructive sleep apnea (adult) (pediatric): Secondary | ICD-10-CM | POA: Insufficient documentation

## 2016-11-28 DIAGNOSIS — Z794 Long term (current) use of insulin: Secondary | ICD-10-CM | POA: Diagnosis not present

## 2016-11-28 LAB — BASIC METABOLIC PANEL
ANION GAP: 8 (ref 5–15)
BUN: 10 mg/dL (ref 6–20)
CO2: 22 mmol/L (ref 22–32)
CREATININE: 0.93 mg/dL (ref 0.61–1.24)
Calcium: 8.6 mg/dL — ABNORMAL LOW (ref 8.9–10.3)
Chloride: 106 mmol/L (ref 101–111)
GFR calc non Af Amer: >60 mL/min (ref >60)
Glucose, Bld: 92 mg/dL (ref 65–99)
Potassium: 4.1 mmol/L (ref 3.5–5.1)
SODIUM: 136 mmol/L (ref 135–145)

## 2016-11-28 MED ORDER — FUROSEMIDE 40 MG PO TABS: 40.0000 mg | ORAL_TABLET | Freq: Every day | ORAL | 3 refills | Status: DC

## 2016-11-28 NOTE — Progress Notes (Signed)
    ReDS Vest - 11/28/16 0900      ReDS Vest   MR  No   Fitting Posture Standing   Height Marker Short   Ruler Value 7   Center Strip Shifted   ReDS Value 39

## 2016-11-28 NOTE — Telephone Encounter (Signed)
Follow up of tobacco cessation with Chantix.  Denies any intolerance of Chantix.  States he continues to smoke "about the same amount".  He remains interested in quitting and hopes he can quit by then end of his current supply of pills.  He has NOT set a quit date at this time.   I am happy to follow-up with him PRN.  No follow-up contact was requested by the patient after this call.

## 2016-11-28 NOTE — Progress Notes (Signed)
Patient ID: Joseph [name], male   DOB: 1965-04-28, 51 y.o.   MRN: [id number]     Advanced Heart Failure Clinic Note   PCP: Dr. Andy Gauss EP: Dr Caryl Comes  HF: Dr. Aundra Dubin   HPI: Joseph [name] is a 51 y.o. male with history of chronic systolic HF (EF previously 30-35%, now improved to 50-55%), nonischemic cardiomyopathy, HTN, TIA, stroke, DM2, paroxysmal atrial fibrillation tobacco abuse and polysubstance abuse.   Patient had atrial fibrillation noted on ICD interrogation and started apixaban in 1/16.   Today he returns for HF follow up. Overall feeling ok. Denies SOB/PND/Orthopnea. Not able to weigh at home due to broken scale. Using CPAP nightly. Eating high salt foods. No bleeding problems. Taking all medications. Smoking 7-8 cigarettes per day.   Labs (5/18): K 4.5, creatinine 1.0 Labs (8/18): hgb 13.3, LDL 51, K 3.9, creatinine 2.27 Labs (11/20/2016): Creatinine 1.03 BNP 10.   SH: No drugs as of 06/28/13. Smokes 1/2 ppd.  No ETOH.   FH: Mother: DM2, living        Father deceased, no issues Review of systems complete and found to be negative unless listed in HPI.    PMH: 1) HTN 2) NICM: LHC (06/2013): Mild coronary obstructive disease with 30% narrowing in the mid AV groove circumflex, and 30% proximal and 40% mid RCA stenoses with otherwise normal LAD and ramus intermediate vessel.  RHC (06/2013) RA: 6, RV: 26/9, PA: 26/13, PC: a 18 mean 13, CO/CI Fick: 3.7/2.0, left ventriculography EF 15%; ECHO (06/2013) EF 20%, diffuse HK, mild MR; TEE (07/2013) no thrombus; ECHO (10/2013): EF 20-25%, diff HK, LA mod dilated; [employer] ICD implanted (12/01/13).  Echo (4/16) with EF 30-35%, diffuse hypokinesis, normal RV.  - Echo (5/18) with EF 50-55%, mild LVH.  3) DM2 4) Stroke: MRI (07/2013) revealed an acute infarction, with a greater than 3 cm infarct involving left frontal opercular cortex and subcortical white matter. Carotid dopplers (07/2013) mild soft plaque origin ICA. 1-39% ICA  stenosis. Vertebral artery flow is antegrade 5) Polysubstance abuse: + cocaine UDS in past.  6) Atrial fibrillation: Paroxysmal, started Eliquis in 1/16.  7) Nephrolithiasis with AKI   Current Outpatient Prescriptions  Medication Sig Dispense Refill  . apixaban (ELIQUIS) 5 MG TABS tablet Take 1 tablet (5 mg total) by mouth 2 (two) times daily. 180 tablet 0  . atorvastatin (LIPITOR) 80 MG tablet TAKE 1 TABLET BY MOUTH EVERY EVENING 90 tablet 3  . carvedilol (COREG) 25 MG tablet TAKE 1 TABLET BY MOUTH 2 TIMES DAILY WITH A MEAL. 60 tablet 3  . furosemide (LASIX) 40 MG tablet Take 1 tablet (40 mg total) by mouth every other day. 45 tablet 3  . glucose blood (ACCU-CHEK AVIVA) test strip Use as instructed 100 each 12  . insulin aspart (NOVOLOG FLEXPEN) 100 UNIT/ML FlexPen Inject 15-20 Units into the skin 3 (three) times daily with meals. Take 15 if CBGs 80-199 and 20 units if > 200 15 mL 11  . Insulin Glargine (LANTUS SOLOSTAR) 100 UNIT/ML Solostar Pen Inject 35 Units into the skin daily at 10 pm. 15 mL 2  . Insulin Syringes, Disposable, U-100 0.3 ML MISC 1 each by Does not apply route daily. 100 each 1  . metFORMIN (GLUCOPHAGE) 1000 MG tablet Take 1 tablet (1,000 mg total) by mouth 2 (two) times daily with a meal. 60 tablet 2  . ReliOn Ultra Thin Lancets MISC Patient will need to test his blood glucose once daily prior to breakfast  and metformin and amaryl administration. 100 each 2  . sacubitril-valsartan (ENTRESTO) 97-103 MG Take 1 tablet by mouth 2 (two) times daily.    Marland Kitchen spironolactone (ALDACTONE) 25 MG tablet Take 1 tablet (25 mg total) by mouth daily. 90 tablet 3  . varenicline (CHANTIX PAK) 0.5 MG X 11 & 1 MG X 42 tablet Take one 0.5 mg tablet daily X  3 days, then increase to one 0.5 mg  twice daily for 4 days, then increase to one 1 mg tablet twice daily. 53 tablet 0   No current facility-administered medications for this encounter.         ReDS Vest - 11/28/16 0900      ReDS Vest    MR  No   Fitting Posture Standing   Height Marker Short   Ruler Value 7   Center Strip Shifted   ReDS Value 39      Vitals:   11/28/16 0907  BP: (!) 132/96  Pulse: 91  SpO2: 98%  Weight: 204 lb 6.4 oz (92.7 kg)   Wt Readings from Last 3 Encounters:  11/28/16 204 lb 6.4 oz (92.7 kg)  11/20/16 203 lb 3.2 oz (92.2 kg)  11/14/16 199 lb 12.8 oz (90.6 kg)     PHYSICAL EXAM: General:  Well appearing. No resp difficulty HEENT: normal Neck: supple. JVP hard to assess due to thick neck. Carotids 2+ bilat; no bruits. No lymphadenopathy or thryomegaly appreciated. Cor: PMI nondisplaced. Regular rate & rhythm. No rubs, gallops or murmurs. Lungs: clear Abdomen: soft, nontender, nondistended. No hepatosplenomegaly. No bruits or masses. Good bowel sounds. Extremities: no cyanosis, clubbing, rash, edema Neuro: alert & orientedx3, cranial nerves grossly intact. moves all 4 extremities w/o difficulty. Affect pleasant  ASSESSMENT & PLAN:  1) Chronic systolic/diastolic  HF: Nonischemic cardiomyopathy s/p [employer] ICD. Narrow QRS so not CRT candidate. Echo 06/2014 30-35% with diffuse hypokinesis. Most recent echo in 5/18 with EF improved to 50-55%. NYHA II. Volume status elevated. Reds Vest 39. Increase lasix to 40 mg daily. Check BMET in 10 days.  - Continue Coreg 25 mg BID.  - Continue Spiro 25 mg daily - Continue Entresto at 24/26 mg BID.  Do the following things EVERYDAY: 1) Weigh yourself in the morning before breakfast. Write it down and keep it in a log. 2) Take your medicines as prescribed 3) Eat low salt foods-Limit salt (sodium) to 2000 mg per day.  4) Stay as active as you can everyday 5) Limit all fluids for the day to less than 2 liters 2) Non-obstructive CAD:  - No S/S ischemia. Continue statin and eliquis. - No ASA with need for Eliquis.   3) Atrial fibrillation:  Regular pulse. No bleeding problems.    - Continue Eliquis 5 mg BID.  4) OSA:  - Compliant with CPAP   5) Polysubstance abuse: Has been drug free since 06/2013.  - Not using.  6) Stroke: In 5/15, likely related to atrial fibrillation.  - No residual deficits.  7) Nephrolithiasis with AKI:  - Resolved. Follows with Urology.  8) Smoker - Discussed smoking cessation.   BMET in 10 days. Follow up 3 months.    Darrick Grinder, NP  11/28/2016

## 2016-11-28 NOTE — Patient Instructions (Signed)
INCREASE Lasix to 40 mg, one tab daily  Labs today We will only contact you if something comes back abnormal or we need to make some changes. Otherwise no news is good news!  labs needed in 10 days  Your physician discussed the hazards of tobacco use. Tobacco use cessation is recommended and techniques and options to help you quit were discussed.   Your physician recommends that you schedule a follow-up appointment in: 3 months   Do the following things EVERYDAY: 1) Weigh yourself in the morning before breakfast. Write it down and keep it in a log. 2) Take your medicines as prescribed 3) Eat low salt foods-Limit salt (sodium) to 2000 mg per day.  4) Stay as active as you can everyday 5) Limit all fluids for the day to less than 2 liters

## 2016-11-28 NOTE — Progress Notes (Signed)
Advanced Heart Failure Medication Review by a Pharmacist  Does the patient  feel that his/her medications are working for him/her?  yes  Has the patient been experiencing any side effects to the medications prescribed?  no  Does the patient measure his/her own blood pressure or blood glucose at home?  yes   Does the patient have any problems obtaining medications due to transportation or finances?   no  Understanding of regimen: good Understanding of indications: good Potential of compliance: good Patient understands to avoid NSAIDs. Patient understands to avoid decongestants.  Issues to address at subsequent visits: None   Pharmacist comments: Mr. Stockard is a pleasant 51 yo M presenting with his medication bottles. He reports good compliance with his regimen and did not have any specific medication-related questions or concerns for me at this time.   [name], PharmD, BCPS, CPP Clinical Pharmacist Pager: [phone] Phone: [phone] 11/28/2016 9:13 AM      Time with patient: 10 minutes Preparation and documentation time: 2 minutes Total time: 12 minutes

## 2016-12-06 NOTE — Addendum Note (Signed)
Encounter addended by: Darrick Grinder D, NP on: 12/06/2016  4:15 PM<BR>    Actions taken: LOS modified

## 2016-12-09 ENCOUNTER — Ambulatory Visit (HOSPITAL_COMMUNITY)
Admission: RE | Admit: 2016-12-09 | Discharge: 2016-12-09 | Disposition: A | Discharge status: Home / Self Care | Payer: Medicaid Other | Source: Ambulatory Visit | Attending: Internal Medicine | Admitting: Internal Medicine

## 2016-12-09 ENCOUNTER — Other Ambulatory Visit (HOSPITAL_COMMUNITY): Payer: Self-pay | Admitting: *Deleted

## 2016-12-09 DIAGNOSIS — I5022 Chronic systolic (congestive) heart failure: Secondary | ICD-10-CM | POA: Diagnosis present

## 2016-12-09 LAB — BASIC METABOLIC PANEL
ANION GAP: 6 (ref 5–15)
BUN: 18 mg/dL (ref 6–20)
CHLORIDE: 107 mmol/L (ref 101–111)
CO2: 22 mmol/L (ref 22–32)
Calcium: 8.7 mg/dL — ABNORMAL LOW (ref 8.9–10.3)
Creatinine, Ser: 1.19 mg/dL (ref 0.61–1.24)
GFR calc Af Amer: >60 mL/min (ref >60)
GLUCOSE: 92 mg/dL (ref 65–99)
POTASSIUM: 4.4 mmol/L (ref 3.5–5.1)
Sodium: 135 mmol/L (ref 135–145)

## 2017-01-07 ENCOUNTER — Ambulatory Visit (INDEPENDENT_AMBULATORY_CARE_PROVIDER_SITE_OTHER): Payer: Medicaid Other | Admitting: Student in an Organized Health Care Education/Training Program

## 2017-01-07 ENCOUNTER — Encounter: Payer: Self-pay | Admitting: Student in an Organized Health Care Education/Training Program

## 2017-01-07 ENCOUNTER — Other Ambulatory Visit: Payer: Self-pay | Admitting: Family Medicine

## 2017-01-07 VITALS — BP 110/70 | HR 77 | Temp 98.2°F | Wt 202.8 lb

## 2017-01-07 DIAGNOSIS — R21 Rash and other nonspecific skin eruption: Secondary | ICD-10-CM

## 2017-01-07 DIAGNOSIS — B86 Scabies: Secondary | ICD-10-CM

## 2017-01-07 HISTORY — DX: Rash and other nonspecific skin eruption: R21

## 2017-01-07 MED ORDER — TRIAMCINOLONE ACETONIDE 0.5 % EX OINT: 1.0000 "application " | TOPICAL_OINTMENT | Freq: Two times a day (BID) | CUTANEOUS | 0 refills | Status: DC

## 2017-01-07 MED ORDER — PREDNISONE 10 MG PO TABS: 10.0000 mg | ORAL_TABLET | ORAL | 0 refills | Status: DC

## 2017-01-07 MED ORDER — PERMETHRIN 5 % EX CREA: 1.0000 "application " | TOPICAL_CREAM | CUTANEOUS | 0 refills | Status: DC

## 2017-01-07 MED ORDER — PERMETHRIN 5 % EX CREA: 1.0000 "application " | TOPICAL_CREAM | CUTANEOUS | 0 refills | Status: AC

## 2017-01-07 MED ORDER — TRIAMCINOLONE ACETONIDE 0.5 % EX OINT
1.0000 | TOPICAL_OINTMENT | Freq: Two times a day (BID) | CUTANEOUS | 0 refills | Status: DC
Start: 2017-01-07 — End: 2017-08-18

## 2017-01-07 NOTE — Patient Instructions (Addendum)
It was a pleasure seeing you today in our clinic. Today we discussed your rash. Here is the treatment plan we have discussed and agreed upon together:  I am not sure what is causing your rash. To be on the safe side, we will treat for scabies. This is a medicated lotion that is put in the body from the neck down, kept on overnight and wash off in the morning. He'll have to wash all of your bedclothes. Washer close. Things that cannot going washing machine need to be put in a garbage bag and There for 1 week so that any mites will die.  We will also do a prednisone taper, so you will start with 4 pills today and gradually go down to 1 pill a day. I also ordered Kenalog ointment that can be started after scabies treatment is completed.  If your symptoms worsen or fail to improve please follow-up in our clinic. If at any point he developed shortness of breath or difficulty breathing, or feel like her throat is being affected and please seek medical attention immediately.  Our clinic's number is [phone]. Please call with questions or concerns about what we discussed today.  Be well, Dr. Burr Medico

## 2017-01-07 NOTE — Progress Notes (Signed)
   CC: Rash  HPI: Joseph [name] is a 51 y.o. male who presents for rash.  RASH Patient reports generalized pruritic rash that started 1 week ago and has worsened. He notes that it started on his face and moved his arms. He has 1 prior episode for which she was seen in the ED and prescribed Kenalog ointment which helped his symptoms, he believes his symptoms completely resolved after that episode. No contacts with similar symptoms. No recent hotel stays. No fevers, redness, or warmth per patient. No new exposures including soaps, lotions, detergents, or new medications in the last 6 weeks.  No URI symptoms, no congestion or cold, no nausea vomiting diarrhea or constipation.  Review of Symptoms:  See HPI for ROS.   CC, SH/smoking status, and VS noted.  Objective: BP 110/70   Pulse 77   Temp 98.2 F (36.8 C) (Oral)   Wt 202 lb 12.8 oz (92 kg)   SpO2 98%   BMI 33.75 kg/m  GEN: NAD, alert, cooperative, and pleasant. SKIN: Generalized rash noted over his face and arms, less prominent on his back, chest, and legs. Excoriations noted on his arm with some scabbing. Appears to be papular over his arms and face. No erythema, drainage, or warmth.  Assessment and plan:  Generalized rash Uncertain etiology. The lesions on his arms are consistent with scabies, however would not expect scabies to affect his face. Could also be an allergic reaction to something he ate. He denies starting any new medications in the last 6 weeks. - To be on the safe side we'll go ahead and treat for scabies with permethrin 5% cream, repeat dose in 3 days. - For possible allergic exposure, will treat with prednisone taper - Kenalog ointment can be initiated after scabies treatment - Advised to wash bedclothes, clothes appropriately to prevent transmission of scabies - Red flags and return precautions were discussed - Follow-up as needed if symptoms worsen or fail to improve   Meds ordered this encounter    Medications  . permethrin (ELIMITE) 5 % cream    Sig: Apply 1 application every 3 (three) days for 2 doses topically. Apply to entire body from the neck down before bed, wash off in the morning. Repeat in 3 days.    Dispense:  60 g    Refill:  0  . predniSONE (DELTASONE) 10 MG tablet    Sig: Take 1-4 tablets (10-40 mg total) See admin instructions by mouth. Take 4 pills on the first day (40 mg) Take 3 pills on the second day (30 mg) Take 2 pills on the third day (20 mg) Take one pill on the fourth day (10 mg)    Dispense:  10 tablet    Refill:  0  . triamcinolone ointment (KENALOG) 0.5 %    Sig: Apply 1 application 2 (two) times daily topically. Start after treatment for scabies is complete.    Dispense:  30 g    Refill:  0    Everrett Coombe, MD,MS,  PGY2 01/07/2017 12:11 PM

## 2017-01-07 NOTE — Assessment & Plan Note (Signed)
Uncertain etiology. The lesions on his arms are consistent with scabies, however would not expect scabies to affect his face. Could also be an allergic reaction to something he ate. He denies starting any new medications in the last 6 weeks. - To be on the safe side we'll go ahead and treat for scabies with permethrin 5% cream, repeat dose in 3 days. - For possible allergic exposure, will treat with prednisone taper - Kenalog ointment can be initiated after scabies treatment - Advised to wash bedclothes, clothes appropriately to prevent transmission of scabies - Red flags and return precautions were discussed - Follow-up as needed if symptoms worsen or fail to improve

## 2017-01-13 ENCOUNTER — Other Ambulatory Visit (HOSPITAL_COMMUNITY): Payer: Self-pay | Admitting: Student

## 2017-01-13 ENCOUNTER — Other Ambulatory Visit: Payer: Self-pay | Admitting: *Deleted

## 2017-01-13 MED ORDER — CARVEDILOL 25 MG PO TABS: ORAL_TABLET | ORAL | 3 refills | Status: DC

## 2017-01-15 ENCOUNTER — Telehealth: Payer: Self-pay | Admitting: Family Medicine

## 2017-01-15 NOTE — Telephone Encounter (Signed)
Pharmacy called regarding the presciption they got for the pt. They are wanting to confirm the quantity and the directions with the Rx Sildenafil, because they say they dont match up. Please give them a call back at  [phone].

## 2017-01-15 NOTE — Telephone Encounter (Signed)
Will forward to MD to advise. Xochilth Standish,CMA  

## 2017-01-20 ENCOUNTER — Other Ambulatory Visit (HOSPITAL_COMMUNITY): Payer: Self-pay | Admitting: Cardiology

## 2017-01-28 NOTE — Telephone Encounter (Signed)
[facility] calling back asking for clarification on sildenafil. Rx written for 10 tabs but dir state 1 tab TID. Please call [facility] at [phone] to clarify. Hubbard Hartshorn, RN, BSN

## 2017-01-30 NOTE — Telephone Encounter (Signed)
Spoke with Moshe Salisbury at the pharmacy and directions were changed to 1 tab daily prn.  Jazmin Hartsell,CMA

## 2017-01-30 NOTE — Telephone Encounter (Signed)
Jazmin,  Please could you call the pharmacy and let them know that sildenafil should be written as needed (prn) and not TID.  Thank you  Marjie Skiff, MD [place], PGY-2

## 2017-02-14 ENCOUNTER — Ambulatory Visit (HOSPITAL_COMMUNITY)
Admission: RE | Admit: 2017-02-14 | Discharge: 2017-02-14 | Disposition: A | Discharge status: Home / Self Care | Payer: Medicaid Other | Source: Ambulatory Visit | Attending: Cardiology | Admitting: Cardiology

## 2017-02-14 ENCOUNTER — Telehealth (HOSPITAL_COMMUNITY): Payer: Self-pay

## 2017-02-14 VITALS — BP 102/68 | HR 95 | Wt 208.4 lb

## 2017-02-14 DIAGNOSIS — Z833 Family history of diabetes mellitus: Secondary | ICD-10-CM | POA: Diagnosis not present

## 2017-02-14 DIAGNOSIS — E785 Hyperlipidemia, unspecified: Secondary | ICD-10-CM

## 2017-02-14 DIAGNOSIS — I2510 Atherosclerotic heart disease of native coronary artery without angina pectoris: Secondary | ICD-10-CM | POA: Insufficient documentation

## 2017-02-14 DIAGNOSIS — Z9889 Other specified postprocedural states: Secondary | ICD-10-CM | POA: Diagnosis not present

## 2017-02-14 DIAGNOSIS — Z7902 Long term (current) use of antithrombotics/antiplatelets: Secondary | ICD-10-CM | POA: Insufficient documentation

## 2017-02-14 DIAGNOSIS — Z87891 Personal history of nicotine dependence: Secondary | ICD-10-CM | POA: Diagnosis not present

## 2017-02-14 DIAGNOSIS — Z8673 Personal history of transient ischemic attack (TIA), and cerebral infarction without residual deficits: Secondary | ICD-10-CM | POA: Insufficient documentation

## 2017-02-14 DIAGNOSIS — F1411 Cocaine abuse, in remission: Secondary | ICD-10-CM | POA: Diagnosis not present

## 2017-02-14 DIAGNOSIS — G4733 Obstructive sleep apnea (adult) (pediatric): Secondary | ICD-10-CM | POA: Diagnosis not present

## 2017-02-14 DIAGNOSIS — Z79899 Other long term (current) drug therapy: Secondary | ICD-10-CM | POA: Diagnosis not present

## 2017-02-14 DIAGNOSIS — I480 Paroxysmal atrial fibrillation: Secondary | ICD-10-CM

## 2017-02-14 DIAGNOSIS — I739 Peripheral vascular disease, unspecified: Secondary | ICD-10-CM | POA: Diagnosis not present

## 2017-02-14 DIAGNOSIS — Z794 Long term (current) use of insulin: Secondary | ICD-10-CM | POA: Insufficient documentation

## 2017-02-14 DIAGNOSIS — Z87442 Personal history of urinary calculi: Secondary | ICD-10-CM | POA: Insufficient documentation

## 2017-02-14 DIAGNOSIS — E119 Type 2 diabetes mellitus without complications: Secondary | ICD-10-CM | POA: Insufficient documentation

## 2017-02-14 DIAGNOSIS — I5022 Chronic systolic (congestive) heart failure: Secondary | ICD-10-CM

## 2017-02-14 DIAGNOSIS — F1911 Other psychoactive substance abuse, in remission: Secondary | ICD-10-CM | POA: Insufficient documentation

## 2017-02-14 DIAGNOSIS — I429 Cardiomyopathy, unspecified: Secondary | ICD-10-CM | POA: Insufficient documentation

## 2017-02-14 DIAGNOSIS — I110 Hypertensive heart disease with heart failure: Secondary | ICD-10-CM | POA: Insufficient documentation

## 2017-02-14 LAB — CBC
HCT: 38.1 % — ABNORMAL LOW (ref 39.0–52.0)
Hemoglobin: 12.8 g/dL — ABNORMAL LOW (ref 13.0–17.0)
MCH: 31.9 pg (ref 26.0–34.0)
MCHC: 33.6 g/dL (ref 30.0–36.0)
MCV: 95 fL (ref 78.0–100.0)
PLATELETS: 174 10*3/uL (ref 150–400)
RBC: 4.01 MIL/uL — AB (ref 4.22–5.81)
RDW: 14.4 % (ref 11.5–15.5)
WBC: 12 10*3/uL — AB (ref 4.0–10.5)

## 2017-02-14 LAB — LIPID PANEL
CHOL/HDL RATIO: 3.5 ratio
CHOLESTEROL: 125 mg/dL (ref 0–200)
HDL: 36 mg/dL — AB (ref >40)
LDL Cholesterol: 54 mg/dL (ref 0–99)
Triglycerides: 175 mg/dL — ABNORMAL HIGH (ref <150)
VLDL: 35 mg/dL (ref 0–40)

## 2017-02-14 LAB — BASIC METABOLIC PANEL
Anion gap: 9 (ref 5–15)
BUN: 31 mg/dL — ABNORMAL HIGH (ref 6–20)
CHLORIDE: 106 mmol/L (ref 101–111)
CO2: 22 mmol/L (ref 22–32)
CREATININE: 1.65 mg/dL — AB (ref 0.61–1.24)
Calcium: 9.2 mg/dL (ref 8.9–10.3)
GFR, EST AFRICAN AMERICAN: 53 mL/min — AB (ref >60)
GFR, EST NON AFRICAN AMERICAN: 46 mL/min — AB (ref >60)
Glucose, Bld: 180 mg/dL — ABNORMAL HIGH (ref 65–99)
POTASSIUM: 3.9 mmol/L (ref 3.5–5.1)
SODIUM: 137 mmol/L (ref 135–145)

## 2017-02-14 MED ORDER — FUROSEMIDE 40 MG PO TABS: 20.0000 mg | ORAL_TABLET | Freq: Every day | ORAL | 3 refills | Status: DC

## 2017-02-14 NOTE — Patient Instructions (Signed)
Labs drawn today (if we do not call you, then your lab work was stable)   Your physician has requested that you have an echocardiogram. Echocardiography is a painless test that uses sound waves to create images of your heart. It provides your doctor with information about the size and shape of your heart and how well your heart's chambers and valves are working. This procedure takes approximately one hour. There are no restrictions for this procedure.  Your physician recommends that you schedule a follow-up appointment in: 6 months with Dr. Aundra Dubin   an a echocardiogram

## 2017-02-14 NOTE — Telephone Encounter (Signed)
Notes recorded by Shirley Muscat, RN on 02/14/2017 at 4:47 PM EST Pt aware of results agreeable to med changes. Changes made in [facility], orders placed ------  Notes recorded by Larey Dresser, MD on 02/14/2017 at 3:35 PM EST Decrease Lasix to 20 mg daily with BMET again in 1 week (creatinine up).

## 2017-02-16 NOTE — Progress Notes (Signed)
Patient ID: Joseph [name], male   DOB: [birth year], 51 y.o.   MRN: [id number]     Advanced Heart Failure Clinic Note   PCP: Dr. Andy Gauss EP: Dr Caryl Comes  HF: Dr. Aundra Dubin   HPI: Joseph [name] is a 51 y.o. male with history of chronic systolic HF, nonischemic cardiomyopathy, HTN, TIA, stroke, DM2, paroxysmal atrial fibrillation tobacco abuse and polysubstance abuse.   Patient had atrial fibrillation noted on ICD interrogation and started apixaban in 1/16.   He moved to [place] for a couple of years but is now back in [place].  He returns for followup.  Most recent echo in 5/18 showed improvement in EF to 50-55% with mild LVH. He has a new job with [employer], no limitations at work.  No significant dyspnea, can walk up a flight of steps without problems.  No lightheadedness. No orthopnea/PND.  No chest pain.  No BRBPR/melena. He gets pain in the thighs bilaterally with ambulation (associated with numbness/tingling).  He quit smoking in 10/18.  He is using CPAP.    Labs (5/18): K 4.5, creatinine 1.0 Labs (8/18): hgb 13.3, LDL 51, K 3.9, creatinine 2.27 Labs (10/18): K 4.4, creatinine 1.19  ECG (personally reviewed): NSR, left axis deviation  SH: No drugs as of 06/28/13. Quit smoking in 10/18. No ETOH.   FH: Mother: DM2, living        Father deceased, no issues  Review of systems complete and found to be negative unless listed in HPI.    PMH: 1) HTN 2) NICM: LHC (06/2013): Mild coronary obstructive disease with 30% narrowing in the mid AV groove circumflex, and 30% proximal and 40% mid RCA stenoses with otherwise normal LAD and ramus intermediate vessel.  RHC (06/2013) RA: 6, RV: 26/9, PA: 26/13, PC: a 18 mean 13, CO/CI Fick: 3.7/2.0, left ventriculography EF 15%; ECHO (06/2013) EF 20%, diffuse HK, mild MR; TEE (07/2013) no thrombus; ECHO (10/2013): EF 20-25%, diff HK, LA mod dilated; [employer] ICD implanted (12/01/13).  Echo (4/16) with EF 30-35%, diffuse hypokinesis, normal RV.  - Echo (5/18)  with EF 50-55%, mild LVH.  3) DM2 4) Stroke: MRI (07/2013) revealed an acute infarction, with a greater than 3 cm infarct involving left frontal opercular cortex and subcortical white matter. Carotid dopplers (07/2013) mild soft plaque origin ICA. 1-39% ICA stenosis. Vertebral artery flow is antegrade 5) Polysubstance abuse: + cocaine UDS in past.  6) Atrial fibrillation: Paroxysmal, started Eliquis in 1/16.  7) Nephrolithiasis with AKI   Current Outpatient Medications  Medication Sig Dispense Refill  . apixaban (ELIQUIS) 5 MG TABS tablet Take 1 tablet (5 mg total) by mouth 2 (two) times daily. 180 tablet 0  . atorvastatin (LIPITOR) 80 MG tablet TAKE 1 TABLET BY MOUTH EVERY EVENING 90 tablet 0  . carvedilol (COREG) 25 MG tablet TAKE 1 TABLET BY MOUTH 2 TIMES DAILY WITH A MEAL. 60 tablet 3  . ENTRESTO 97-103 MG TAKE ONE TABLET BY MOUTH 2 TIMES A DAY 60 tablet 0  . glucose blood (ACCU-CHEK AVIVA) test strip Use as instructed 100 each 12  . insulin aspart (NOVOLOG FLEXPEN) 100 UNIT/ML FlexPen Inject 15-20 Units into the skin 3 (three) times daily with meals. Take 15 if CBGs 80-199 and 20 units if > 200 15 mL 11  . Insulin Glargine (LANTUS SOLOSTAR) 100 UNIT/ML Solostar Pen Inject 35 Units into the skin daily at 10 pm. 15 mL 2  . Insulin Syringes, Disposable, U-100 0.3 ML MISC 1 each by Does  not apply route daily. 100 each 1  . metFORMIN (GLUCOPHAGE) 1000 MG tablet Take 1 tablet (1,000 mg total) by mouth 2 (two) times daily with a meal. 60 tablet 2  . ReliOn Ultra Thin Lancets MISC Patient will need to test his blood glucose once daily prior to breakfast and metformin and amaryl administration. 100 each 2  . spironolactone (ALDACTONE) 25 MG tablet Take 1 tablet (25 mg total) by mouth daily. 90 tablet 3  . triamcinolone ointment (KENALOG) 0.5 % Apply 1 application 2 (two) times daily topically. Start after treatment for scabies is complete. 30 g 0  . varenicline (CHANTIX PAK) 0.5 MG X 11 & 1 MG X 42  tablet Take one 0.5 mg tablet daily X  3 days, then increase to one 0.5 mg  twice daily for 4 days, then increase to one 1 mg tablet twice daily. 53 tablet 0  . furosemide (LASIX) 40 MG tablet Take 0.5 tablets (20 mg total) by mouth daily. 90 tablet 3   No current facility-administered medications for this encounter.      Vitals:   02/14/17 1017  BP: 102/68  Pulse: 95  SpO2: 97%  Weight: 208 lb 6.4 oz (94.5 kg)   Wt Readings from Last 3 Encounters:  02/14/17 208 lb 6.4 oz (94.5 kg)  01/07/17 202 lb 12.8 oz (92 kg)  11/28/16 204 lb 6.4 oz (92.7 kg)     PHYSICAL EXAM: General: NAD Neck: Thick, no JVD, no thyromegaly or thyroid nodule.  Lungs: Clear to auscultation bilaterally with normal respiratory effort. CV: Nondisplaced PMI.  Heart regular S1/S2, no S3/S4, no murmur.  No peripheral edema.  No carotid bruit.  1+ PT pulse on left, difficult to palpate PT on right.   Abdomen: Soft, nontender, no hepatosplenomegaly, no distention.  Skin: Intact without lesions or rashes.  Neurologic: Alert and oriented x 3.  Psych: Normal affect. Extremities: No clubbing or cyanosis.  HEENT: Normal.   ASSESSMENT & PLAN:  1) Chronic systolic HF: Nonischemic cardiomyopathy s/p [employer] ICD. Narrow QRS so not CRT candidate. Echo 06/2014 30-35% with diffuse hypokinesis. Most recent echo in 5/18 with EF improved to 50-55%. NYHA class I-II symptoms.  He is not volume overloaded on exam.    - Continue coreg 25 mg BID.  - Continue current Entresto and spironolactone.  - Would repeat echo in 5/19 to make sure that EF stays up.  2) Non-obstructive CAD: No CP. Continue atorvastatin 80 mg daily.  Good lipids this year.  3) Atrial fibrillation: Paroxysmal, NSR today.  - Remains on Eliquis 5 mg BID.  4) OSA: Using CPAP.   5) Polysubstance abuse: Has been drug free since 06/2013.  6) Stroke: In 5/15, likely related to atrial fibrillation. Now on Eliquis.  7) Smoking: I congratulated him for  cessation.  8) Leg pain: Bilateral thighs with ambulation. By description, may be more neuropathic but right PT pulse is not strong so would like to rule out PAD.  - Will arrange for peripheral arterial doppler evaluation.    Followup in 5/19 with echo.    Loralie Champagne, MD  02/16/2017

## 2017-02-20 ENCOUNTER — Ambulatory Visit (HOSPITAL_COMMUNITY)
Admission: RE | Admit: 2017-02-20 | Discharge: 2017-02-20 | Disposition: A | Discharge status: Home / Self Care | Payer: Medicaid Other | Source: Ambulatory Visit | Attending: Cardiology | Admitting: Cardiology

## 2017-02-20 DIAGNOSIS — I5022 Chronic systolic (congestive) heart failure: Secondary | ICD-10-CM | POA: Diagnosis present

## 2017-02-20 LAB — BASIC METABOLIC PANEL
Anion gap: 5 (ref 5–15)
BUN: 12 mg/dL (ref 6–20)
CHLORIDE: 112 mmol/L — AB (ref 101–111)
CO2: 22 mmol/L (ref 22–32)
Calcium: 9.4 mg/dL (ref 8.9–10.3)
Creatinine, Ser: 0.95 mg/dL (ref 0.61–1.24)
GFR calc Af Amer: >60 mL/min (ref >60)
GFR calc non Af Amer: >60 mL/min (ref >60)
GLUCOSE: 121 mg/dL — AB (ref 65–99)
Potassium: 3.8 mmol/L (ref 3.5–5.1)
SODIUM: 139 mmol/L (ref 135–145)

## 2017-03-07 ENCOUNTER — Other Ambulatory Visit (HOSPITAL_COMMUNITY): Payer: Self-pay | Admitting: *Deleted

## 2017-03-07 ENCOUNTER — Other Ambulatory Visit: Payer: Self-pay | Admitting: Family Medicine

## 2017-03-07 MED ORDER — SACUBITRIL-VALSARTAN 97-103 MG PO TABS: ORAL_TABLET | ORAL | 3 refills | Status: DC

## 2017-03-26 ENCOUNTER — Other Ambulatory Visit: Payer: Self-pay | Admitting: Family Medicine

## 2017-03-28 ENCOUNTER — Other Ambulatory Visit: Payer: Self-pay

## 2017-03-28 ENCOUNTER — Ambulatory Visit: Payer: Medicaid Other | Admitting: Family Medicine

## 2017-03-28 ENCOUNTER — Other Ambulatory Visit (HOSPITAL_COMMUNITY): Payer: Self-pay | Admitting: *Deleted

## 2017-03-28 ENCOUNTER — Encounter: Payer: Self-pay | Admitting: Family Medicine

## 2017-03-28 VITALS — BP 128/72 | HR 94 | Temp 98.6°F | Ht 64.96 in | Wt 211.0 lb

## 2017-03-28 DIAGNOSIS — E118 Type 2 diabetes mellitus with unspecified complications: Secondary | ICD-10-CM

## 2017-03-28 DIAGNOSIS — Z794 Long term (current) use of insulin: Secondary | ICD-10-CM

## 2017-03-28 LAB — POCT GLYCOSYLATED HEMOGLOBIN (HGB A1C): HEMOGLOBIN A1C: 7.6

## 2017-03-28 MED ORDER — GABAPENTIN 300 MG PO CAPS: 300.0000 mg | ORAL_CAPSULE | Freq: Three times a day (TID) | ORAL | 1 refills | Status: DC

## 2017-03-28 NOTE — Progress Notes (Signed)
Subjective:    Patient ID: Joseph [name], male    DOB: [birth year], 52 y.o.   MRN: [id number]   CC: Follow up for T2DM  HPI: Patient is a 52 yo male who presents today for T2DM management follow up. Patient reports that he has been experiencing some numbness, tingling and burning in both legs and feet. Patient reports that symptoms started a few weeks ago and mostly at night. Patient is currently on Metformin 2000 mg dailt and Lantus 35 units in the morning.   Patient reports that his appetite has increase since he stopped smoking. Patient denies any polyuria or polydipsia. Patient denies any abdominal pain, nausea, vomiting, chest pain, headaches or dizziness.  Smoking status reviewed   ROS: all other systems were reviewed and are negative other than in the HPI   Past Medical History:  Diagnosis Date  . Automatic implantable cardioverter-defibrillator in situ 12/01/2013   SINGLE CHAMBER BY DR Olin Pia  . CAD (coronary artery disease)    a. 06/28/13 non obst dz. managed medically  . CHF (congestive heart failure) ([place])   . High cholesterol   . Hypertension   . Non-ischemic cardiomyopathy ([place])    a. 06/28/13 ECHO: EF 25% and global hypokinesis and cath wuth EF 15%   . Pneumonia ?2004  . Shortness of breath   . Stroke ([place])   . Type II diabetes mellitus ([place])     Past Surgical History:  Procedure Laterality Date  . COLONOSCOPY WITH PROPOFOL N/A 07/08/2014   Procedure: COLONOSCOPY WITH PROPOFOL;  Surgeon: Carol Ada, MD;  Location: [facility] ENDOSCOPY;  Service: Endoscopy;  Laterality: N/A;  . EP IMPLANTABLE DEVICE  12/01/2013   single chamber by dr Caryl Comes  . FINGER AMPUTATION Right 1987   "reattached middle and ring fingers"  . IMPLANTABLE CARDIOVERTER DEFIBRILLATOR IMPLANT N/A 12/01/2013   Procedure: IMPLANTABLE CARDIOVERTER DEFIBRILLATOR IMPLANT;  Surgeon: Deboraha Sprang, MD;  Location: [facility] CATH LAB;  Service: Cardiovascular;  Laterality: N/A;  . LEFT HEART CATHETERIZATION WITH CORONARY  ANGIOGRAM N/A 06/30/2013   Procedure: LEFT HEART CATHETERIZATION WITH CORONARY ANGIOGRAM;  Surgeon: Troy Sine, MD;  Location: [facility] CATH LAB;  Service: Cardiovascular;  Laterality: N/A;  . TEE WITHOUT CARDIOVERSION N/A 07/06/2013   Procedure: TRANSESOPHAGEAL ECHOCARDIOGRAM (TEE);  Surgeon: [name], MD;  Location: [facility] ENDOSCOPY;  Service: Cardiovascular;  Laterality: N/A;    Past medical history, surgical, family, and social history reviewed and updated in the EMR as appropriate.  Objective:  BP 128/72   Pulse 94   Temp 98.6 F (37 C) (Oral)   Ht 5' 4.96" (1.65 m)   Wt 211 lb (95.7 kg)   SpO2 97%   BMI 35.15 kg/m   Vitals and nursing note reviewed  General: NAD, pleasant, able to participate in exam Cardiac: RRR, normal heart sounds, no murmurs. 2+ radial and PT pulses bilaterally Respiratory: CTAB, normal effort, No wheezes, rales or rhonchi Abdomen: soft, nontender, nondistended, no hepatic or splenomegaly, +BS Extremities: foot exam performed, sensation intact bilaterally, no deformities Skin: warm and dry, no rashes noted Neuro: alert and oriented x4, no focal deficits Psych: Normal affect and mood   Assessment & Plan:   #T2DM follow up Patient A1c today is 7.6 up from 7.0 likely secondary to poor diet for the past two months. Patient's bilateral LE burning, tingling consistent with neuropathy in the setting of suboptimal control. Patient had a 10 lb weight gain in the past two month due to recent smoking cessation.  Discuss improving diet and working on exercise regimen. --Will start on gabapentin 300 mg tid will titrate as needed. --Continue current lantus and metformin regimen --Improve diet and exercise plan   Marjie Skiff, MD [place] PGY-2

## 2017-03-28 NOTE — Patient Instructions (Signed)
It was great seeing you today! We have addressed the following issues today  1. Your A1c is 7.6 slightly worse than last time. I would encourage to work on your diet, given the weight gain  2. I will start you on a medications called Gabapentin for your neuropathy. 300 mg three times a day. Let me know if there is any improvement in the next 2-3 weeks.  If we did any lab work today, and the results require attention, either me or my nurse will get in touch with you. If everything is normal, you will get a letter in mail and a message via . If you don't hear from [place] in two weeks, please give [place] a call. Otherwise, we look forward to seeing you again at your next visit. If you have any questions or concerns before then, please call the clinic at [phone].  Please bring all your medications to every doctors visit  Sign up for My Chart to have easy access to your labs results, and communication with your Primary care physician. Please ask Front Desk for some assistance.   Please check-out at the front desk before leaving the clinic.    Take Care,   Dr. Andy Gauss

## 2017-03-31 ENCOUNTER — Other Ambulatory Visit (HOSPITAL_COMMUNITY): Payer: Self-pay

## 2017-03-31 MED ORDER — SACUBITRIL-VALSARTAN 97-103 MG PO TABS: ORAL_TABLET | ORAL | 3 refills | Status: DC

## 2017-04-18 ENCOUNTER — Other Ambulatory Visit: Payer: Self-pay | Admitting: Family Medicine

## 2017-04-23 ENCOUNTER — Other Ambulatory Visit (HOSPITAL_COMMUNITY): Payer: Self-pay | Admitting: Cardiology

## 2017-04-25 ENCOUNTER — Other Ambulatory Visit: Payer: Self-pay | Admitting: Family Medicine

## 2017-05-26 ENCOUNTER — Other Ambulatory Visit: Payer: Self-pay | Admitting: Family Medicine

## 2017-06-17 ENCOUNTER — Ambulatory Visit: Payer: Medicaid Other | Admitting: Family Medicine

## 2017-06-17 ENCOUNTER — Other Ambulatory Visit: Payer: Self-pay

## 2017-06-17 ENCOUNTER — Encounter: Payer: Self-pay | Admitting: Family Medicine

## 2017-06-17 VITALS — BP 120/71 | HR 103 | Temp 98.8°F | Wt 211.0 lb

## 2017-06-17 DIAGNOSIS — Z113 Encounter for screening for infections with a predominantly sexual mode of transmission: Secondary | ICD-10-CM

## 2017-06-17 DIAGNOSIS — Z1159 Encounter for screening for other viral diseases: Secondary | ICD-10-CM | POA: Diagnosis not present

## 2017-06-17 DIAGNOSIS — E118 Type 2 diabetes mellitus with unspecified complications: Secondary | ICD-10-CM

## 2017-06-17 DIAGNOSIS — Z794 Long term (current) use of insulin: Secondary | ICD-10-CM | POA: Diagnosis not present

## 2017-06-17 LAB — POCT GLYCOSYLATED HEMOGLOBIN (HGB A1C): Hemoglobin A1C: 7.7

## 2017-06-17 MED ORDER — GLUCOSE BLOOD VI STRP: ORAL_STRIP | 12 refills | Status: AC

## 2017-06-17 NOTE — Assessment & Plan Note (Signed)
A1c today 7.7 essentially stable from last A1c in January which was 7.6.  Discussed with patient need for medication adherence and further dietary and exercise changes to improve A1c.  We will continue current regimen without any changes.  If patient continued to trend up at next A1c check, will modify regimen.  Foot exam was unremarkable.  Neuropathy significantly improved gabapentin. --Continue Lantus 35 units in the morning --Continue NovoLog with meals between 15 and 20 units --Continue metformin 1000 mg twice daily --Encourage TLC (regular exercise, portion control, avoid carbohydrates).  We will see patient in 3 months for A1c check --We will see patient in clinic in 3 months for A1c check.

## 2017-06-17 NOTE — Patient Instructions (Signed)
It was great seeing you today! We have addressed the following issues today  1. We will have and HIV and Hep test today 2. I am glad burning in your legs is better 3. Keep exercising and watching your diet 4. I refer you to an eye doctor you will get a call to make an appointment  If we did any lab work today, and the results require attention, either me or my nurse will get in touch with you. If everything is normal, you will get a letter in mail and a message via . If you don't hear from [place] in two weeks, please give [place] a call. Otherwise, we look forward to seeing you again at your next visit. If you have any questions or concerns before then, please call the clinic at [phone].  Please bring all your medications to every doctors visit  Sign up for My Chart to have easy access to your labs results, and communication with your Primary care physician. Please ask Front Desk for some assistance.   Please check-out at the front desk before leaving the clinic.    Take Care,   Dr. Andy Gauss

## 2017-06-17 NOTE — Progress Notes (Signed)
Subjective:    Patient ID: Joseph [name], male    DOB: Apr 03, 1965, 52 y.o.   MRN: [id number]   CC: T2DM follow up   HPI: Patient is 52 yo male who presents today to follow up T2DM management. Patient reports he has been walking 4-5 times a week for about 30 min. Patient continue to be adherent to medications, Lantus 35 u, novolog 15u with meals and metformin 2000 mg daily.  Patient reports that he has been monitoring his diet and is cutting down on sweets.  Patient has stopped smoking and has had an increase in appetite since.  Patient reports that neuropathy has significantly improved since starting gabapentin.  Patient denies any polyuria and polydipsia.  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  No acute complaints today.  Smoking status reviewed   ROS: all other systems were reviewed and are negative other than in the HPI   Past Medical History:  Diagnosis Date  . Automatic implantable cardioverter-defibrillator in situ 12/01/2013   SINGLE CHAMBER BY DR Olin Pia  . CAD (coronary artery disease)    a. 06/28/13 non obst dz. managed medically  . CHF (congestive heart failure) ([place])   . High cholesterol   . Hypertension   . Non-ischemic cardiomyopathy ([place])    a. 06/28/13 ECHO: EF 25% and global hypokinesis and cath wuth EF 15%   . Pneumonia ?2004  . Shortness of breath   . Stroke ([place])   . Type II diabetes mellitus ([place])     Past Surgical History:  Procedure Laterality Date  . COLONOSCOPY WITH PROPOFOL N/A 07/08/2014   Procedure: COLONOSCOPY WITH PROPOFOL;  Surgeon: Carol Ada, MD;  Location: [facility] ENDOSCOPY;  Service: Endoscopy;  Laterality: N/A;  . EP IMPLANTABLE DEVICE  12/01/2013   single chamber by dr Caryl Comes  . FINGER AMPUTATION Right 1987   "reattached middle and ring fingers"  . IMPLANTABLE CARDIOVERTER DEFIBRILLATOR IMPLANT N/A 12/01/2013   Procedure: IMPLANTABLE CARDIOVERTER DEFIBRILLATOR IMPLANT;  Surgeon: Deboraha Sprang, MD;  Location: [facility] CATH  LAB;  Service: Cardiovascular;  Laterality: N/A;  . LEFT HEART CATHETERIZATION WITH CORONARY ANGIOGRAM N/A 06/30/2013   Procedure: LEFT HEART CATHETERIZATION WITH CORONARY ANGIOGRAM;  Surgeon: Troy Sine, MD;  Location: [facility] CATH LAB;  Service: Cardiovascular;  Laterality: N/A;  . TEE WITHOUT CARDIOVERSION N/A 07/06/2013   Procedure: TRANSESOPHAGEAL ECHOCARDIOGRAM (TEE);  Surgeon: [name], MD;  Location: [facility] ENDOSCOPY;  Service: Cardiovascular;  Laterality: N/A;    Past medical history, surgical, family, and social history reviewed and updated in the EMR as appropriate.  Objective:  BP 120/71   Pulse (!) 103   Temp 98.8 F (37.1 C) (Oral)   Wt 211 lb (95.7 kg)   SpO2 96%   BMI 35.15 kg/m   Vitals and nursing note reviewed  General: NAD, pleasant, able to participate in exam Cardiac: RRR, normal heart sounds, no murmurs. 2+ radial and PT pulses bilaterally Respiratory: CTAB, normal effort, No wheezes, rales or rhonchi Abdomen: soft, nontender, nondistended, no hepatic or splenomegaly, +BS foot Foot exam: No deformities or lesions noted.  Nails appropriate length.  Sensation is intact.  Good pulses.  Warm to touch Extremities: no edema or cyanosis. WWP. Skin: warm and dry, no rashes noted Neuro: alert and oriented x4, no focal deficits Psych: Normal affect and mood   Assessment & Plan:    Long-term insulin use in type 2 diabetes (HCC) A1c today 7.7 essentially stable from last A1c  in January which was 7.6.  Discussed with patient need for medication adherence and further dietary and exercise changes to improve A1c.  We will continue current regimen without any changes.  If patient continued to trend up at next A1c check, will modify regimen.  Foot exam was unremarkable.  Neuropathy significantly improved gabapentin. --Continue Lantus 35 units in the morning --Continue NovoLog with meals between 15 and 20 units --Continue metformin 1000 mg twice daily --Encourage TLC  (regular exercise, portion control, avoid carbohydrates).  We will see patient in 3 months for A1c check --We will see patient in clinic in 3 months for A1c check.    Marjie Skiff, MD [place] PGY-2

## 2017-06-18 LAB — HEPATITIS C ANTIBODY: Hep C Virus Ab: <0.1 s/co ratio (ref 0.0–0.9)

## 2017-06-18 LAB — HIV ANTIBODY (ROUTINE TESTING W REFLEX): HIV Screen 4th Generation wRfx: NONREACTIVE

## 2017-06-25 ENCOUNTER — Telehealth: Payer: Self-pay

## 2017-06-25 NOTE — Telephone Encounter (Signed)
Called number in chart- LVM requesting a call back in inform of eye apt. If pt calls back, please inform: [facility], 08/21/2017 @9am .

## 2017-06-30 ENCOUNTER — Other Ambulatory Visit: Payer: Self-pay | Admitting: Family Medicine

## 2017-06-30 DIAGNOSIS — E118 Type 2 diabetes mellitus with unspecified complications: Secondary | ICD-10-CM

## 2017-06-30 DIAGNOSIS — Z794 Long term (current) use of insulin: Principal | ICD-10-CM

## 2017-07-10 MED FILL — RESTASIS 0.05% EYE EMULSION: 0.05 | 30 days supply | Qty: 60 | Fill #0

## 2017-07-10 MED FILL — PAZEO 0.7% EYE DROPS: 0.7 | 30 days supply | Qty: 3 | Fill #0

## 2017-07-14 ENCOUNTER — Other Ambulatory Visit: Payer: Self-pay | Admitting: Family Medicine

## 2017-07-14 DIAGNOSIS — E118 Type 2 diabetes mellitus with unspecified complications: Secondary | ICD-10-CM

## 2017-07-14 DIAGNOSIS — Z794 Long term (current) use of insulin: Principal | ICD-10-CM

## 2017-07-16 ENCOUNTER — Other Ambulatory Visit: Payer: Self-pay | Admitting: Gastroenterology

## 2017-07-24 ENCOUNTER — Ambulatory Visit (INDEPENDENT_AMBULATORY_CARE_PROVIDER_SITE_OTHER): Payer: Medicaid Other | Admitting: *Deleted

## 2017-07-24 DIAGNOSIS — I428 Other cardiomyopathies: Secondary | ICD-10-CM

## 2017-07-24 DIAGNOSIS — I10 Essential (primary) hypertension: Secondary | ICD-10-CM

## 2017-07-25 NOTE — Progress Notes (Signed)
Remote ICD transmission.   

## 2017-07-29 ENCOUNTER — Other Ambulatory Visit: Payer: Self-pay | Admitting: Family Medicine

## 2017-08-01 ENCOUNTER — Encounter (HOSPITAL_COMMUNITY): Payer: Self-pay

## 2017-08-04 ENCOUNTER — Telehealth (HOSPITAL_COMMUNITY): Payer: Self-pay | Admitting: *Deleted

## 2017-08-04 ENCOUNTER — Other Ambulatory Visit: Payer: Self-pay

## 2017-08-04 NOTE — Telephone Encounter (Signed)
Entresto PA approved through [place] from 08/04/17 through 07/30/18.  PA approval# [id number].

## 2017-08-04 NOTE — Telephone Encounter (Signed)
Delene Loll PA submitted today through [employer], conf# [id number].  Will wait for approval.

## 2017-08-05 MED ORDER — APIXABAN 5 MG PO TABS: 5.0000 mg | ORAL_TABLET | Freq: Two times a day (BID) | ORAL | 1 refills | Status: DC

## 2017-08-05 NOTE — Telephone Encounter (Signed)
Pt is a 52 yr old male who last saw Dr Aundra Dubin 02/14/17. SCr on 02/20/17 was 0.95.  Last noted weight was 95.7Kg. Will refill Eliquis 5mg  BID.

## 2017-08-11 LAB — CUP PACEART REMOTE DEVICE CHECK
Implantable Lead Implant Date: 20150930
Implantable Lead Serial Number: 311831
Implantable Pulse Generator Implant Date: 20150930
MDC IDC LEAD LOCATION: 753860
MDC IDC SESS DTM: 20190610145455
Pulse Gen Serial Number: 191604

## 2017-08-12 ENCOUNTER — Other Ambulatory Visit: Payer: Self-pay | Admitting: Family Medicine

## 2017-08-12 DIAGNOSIS — Z794 Long term (current) use of insulin: Principal | ICD-10-CM

## 2017-08-12 DIAGNOSIS — E118 Type 2 diabetes mellitus with unspecified complications: Secondary | ICD-10-CM

## 2017-08-22 ENCOUNTER — Ambulatory Visit (HOSPITAL_COMMUNITY): Payer: Medicaid Other | Admitting: Anesthesiology

## 2017-08-22 ENCOUNTER — Encounter (HOSPITAL_COMMUNITY): Payer: Self-pay | Admitting: *Deleted

## 2017-08-22 ENCOUNTER — Ambulatory Visit (HOSPITAL_COMMUNITY)
Admission: RE | Admit: 2017-08-22 | Discharge: 2017-08-22 | Disposition: A | Discharge status: Home / Self Care | Payer: Medicaid Other | Source: Ambulatory Visit | Attending: Gastroenterology | Admitting: Gastroenterology

## 2017-08-22 ENCOUNTER — Other Ambulatory Visit: Payer: Self-pay

## 2017-08-22 ENCOUNTER — Encounter (HOSPITAL_COMMUNITY)
Admission: RE | Disposition: A | Discharge status: Home / Self Care | Payer: Self-pay | Source: Ambulatory Visit | Attending: Gastroenterology

## 2017-08-22 DIAGNOSIS — E119 Type 2 diabetes mellitus without complications: Secondary | ICD-10-CM | POA: Insufficient documentation

## 2017-08-22 DIAGNOSIS — E7800 Pure hypercholesterolemia, unspecified: Secondary | ICD-10-CM | POA: Insufficient documentation

## 2017-08-22 DIAGNOSIS — Z79899 Other long term (current) drug therapy: Secondary | ICD-10-CM | POA: Diagnosis not present

## 2017-08-22 DIAGNOSIS — Z794 Long term (current) use of insulin: Secondary | ICD-10-CM | POA: Diagnosis not present

## 2017-08-22 DIAGNOSIS — K5730 Diverticulosis of large intestine without perforation or abscess without bleeding: Secondary | ICD-10-CM | POA: Insufficient documentation

## 2017-08-22 DIAGNOSIS — I509 Heart failure, unspecified: Secondary | ICD-10-CM | POA: Insufficient documentation

## 2017-08-22 DIAGNOSIS — Z1211 Encounter for screening for malignant neoplasm of colon: Secondary | ICD-10-CM | POA: Diagnosis not present

## 2017-08-22 DIAGNOSIS — I110 Hypertensive heart disease with heart failure: Secondary | ICD-10-CM | POA: Diagnosis not present

## 2017-08-22 DIAGNOSIS — D123 Benign neoplasm of transverse colon: Secondary | ICD-10-CM | POA: Insufficient documentation

## 2017-08-22 DIAGNOSIS — I2510 Atherosclerotic heart disease of native coronary artery without angina pectoris: Secondary | ICD-10-CM | POA: Diagnosis not present

## 2017-08-22 DIAGNOSIS — Z87891 Personal history of nicotine dependence: Secondary | ICD-10-CM | POA: Insufficient documentation

## 2017-08-22 HISTORY — PX: COLONOSCOPY WITH PROPOFOL: SHX5780

## 2017-08-22 LAB — GLUCOSE, CAPILLARY: GLUCOSE-CAPILLARY: 108 mg/dL — AB (ref 65–99)

## 2017-08-22 SURGERY — COLONOSCOPY WITH PROPOFOL
Anesthesia: Monitor Anesthesia Care

## 2017-08-22 MED ORDER — PROPOFOL 10 MG/ML IV BOLUS
INTRAVENOUS | Status: AC
  Filled 2017-08-22: qty 60

## 2017-08-22 MED ORDER — PROPOFOL 10 MG/ML IV BOLUS
INTRAVENOUS | Status: DC | PRN
  Administered 2017-08-22 (×2): 40 mg via INTRAVENOUS
  Administered 2017-08-22: 20 mg via INTRAVENOUS

## 2017-08-22 MED ORDER — LACTATED RINGERS IV SOLN
INTRAVENOUS | Status: DC
  Administered 2017-08-22: 11:00:00 via INTRAVENOUS

## 2017-08-22 MED ORDER — PROPOFOL 500 MG/50ML IV EMUL
INTRAVENOUS | Status: DC | PRN
  Administered 2017-08-22: 150 ug/kg/min via INTRAVENOUS

## 2017-08-22 MED ORDER — SODIUM CHLORIDE 0.9 % IV SOLN: INTRAVENOUS | Status: DC

## 2017-08-22 SURGICAL SUPPLY — 21 items

## 2017-08-22 NOTE — H&P (Signed)
  Joseph [name] HPI: At this time the patient denies any problems with nausea, vomiting, fevers, chills, abdominal pain, diarrhea, constipation, hematochezia, melena, GERD, or dysphagia. The patient denies any known family history of colon cancers. No complaints of chest pain, SOB, MI, or sleep apnea. A routine screening colonoscopy on 07/08/2014 was significant for 4 adenomas. A three year follow up colonoscopy was recommended. The patient takes Eliquis and it is managed by Drs. Jens Som or [place].    Past Medical History:  Diagnosis Date  . Automatic implantable cardioverter-defibrillator in situ 12/01/2013   SINGLE CHAMBER BY DR Olin Pia  . CAD (coronary artery disease)    a. 06/28/13 non obst dz. managed medically  . CHF (congestive heart failure) ([place])   . High cholesterol   . Hypertension   . Non-ischemic cardiomyopathy ([place])    a. 06/28/13 ECHO: EF 25% and global hypokinesis and cath wuth EF 15%   . Pneumonia ?2004  . Shortness of breath   . Stroke ([place])   . Type II diabetes mellitus ([place])     Past Surgical History:  Procedure Laterality Date  . COLONOSCOPY WITH PROPOFOL N/A 07/08/2014   Procedure: COLONOSCOPY WITH PROPOFOL;  Surgeon: Carol Ada, MD;  Location: [facility] ENDOSCOPY;  Service: Endoscopy;  Laterality: N/A;  . EP IMPLANTABLE DEVICE  12/01/2013   single chamber by dr Caryl Comes  . FINGER AMPUTATION Right 1987   "reattached middle and ring fingers"  . IMPLANTABLE CARDIOVERTER DEFIBRILLATOR IMPLANT N/A 12/01/2013   Procedure: IMPLANTABLE CARDIOVERTER DEFIBRILLATOR IMPLANT;  Surgeon: Deboraha Sprang, MD;  Location: [facility] CATH LAB;  Service: Cardiovascular;  Laterality: N/A;  . LEFT HEART CATHETERIZATION WITH CORONARY ANGIOGRAM N/A 06/30/2013   Procedure: LEFT HEART CATHETERIZATION WITH CORONARY ANGIOGRAM;  Surgeon: Troy Sine, MD;  Location: [facility] CATH LAB;  Service: Cardiovascular;  Laterality: N/A;  . TEE WITHOUT CARDIOVERSION N/A 07/06/2013   Procedure: TRANSESOPHAGEAL ECHOCARDIOGRAM  (TEE);  Surgeon: [name], MD;  Location: [facility] ENDOSCOPY;  Service: Cardiovascular;  Laterality: N/A;    No family history on file.  Social History:  reports that he quit smoking about 7 months ago. His smoking use included cigarettes. He has a 15.50 pack-year smoking history. He has never used smokeless tobacco. He reports that he drinks about 19.2 oz of alcohol per week. He reports that he has current or past drug history. Drug: Cocaine.  Allergies:  Allergies  Allergen Reactions  . Tape Rash    Occurred with Nicotine patch - limited rash.     Medications:  Scheduled:  Continuous: . sodium chloride    . lactated ringers      No results found for this or any previous visit (from the past 24 hour(s)).   No results found.  ROS:  As stated above in the HPI otherwise negative.  There were no vitals taken for this visit.    PE: Gen: NAD, Alert and Oriented HEENT:  [place]/AT, EOMI Neck: Supple, no LAD Lungs: CTA Bilaterally CV: RRR without M/G/R ABM: Soft, NTND, +BS Ext: No C/C/E  Assessment/Plan: 1) Personal history of polyps - Colonoscopy.  Joseph [name] D 08/22/2017, 10:18 AM

## 2017-08-22 NOTE — Discharge Instructions (Signed)

## 2017-08-22 NOTE — Anesthesia Postprocedure Evaluation (Signed)
Anesthesia Post Note  Patient: Joseph [name]  Procedure(s) Performed: COLONOSCOPY WITH PROPOFOL (N/A )     Patient location during evaluation: Endoscopy Anesthesia Type: MAC Level of consciousness: awake Pain management: pain level controlled Vital Signs Assessment: post-procedure vital signs reviewed and stable Respiratory status: spontaneous breathing Cardiovascular status: stable Postop Assessment: no apparent nausea or vomiting Anesthetic complications: no    Last Vitals:  Vitals:   08/22/17 1115 08/22/17 1120  BP: (!) 93/36 (!) 89/53  Pulse: 96 89  Resp: 16 14  Temp:  36.7 C  SpO2: 98% 99%    Last Pain:  Vitals:   08/22/17 1120  TempSrc: Oral  PainSc: 0-No pain   Pain Goal:                 Tammatha Cobb JR,JOHN Lyndal Reggio

## 2017-08-22 NOTE — Op Note (Signed)
[facility] Patient Name: Joseph [name] Procedure Date: 08/22/2017 MRN: [id number] Attending MD: Carol Ada , MD Date of Birth: 1965-07-12 CSN: [id number] Age: 52 Admit Type: Outpatient Procedure:                Colonoscopy Indications:              High risk colon cancer surveillance: Personal                            history of colonic polyps Providers:                Carol Ada, MD, Elmer Ramp. Tilden Dome, RN, Cletis Athens,                            Technician Referring MD:              Medicines:                Propofol per Anesthesia Complications:            No immediate complications. Estimated Blood Loss:     Estimated blood loss was minimal. Procedure:                Pre-Anesthesia Assessment:                           - Prior to the procedure, a History and Physical                            was performed, and patient medications and                            allergies were reviewed. The patient's tolerance of                            previous anesthesia was also reviewed. The risks                            and benefits of the procedure and the sedation                            options and risks were discussed with the patient.                            All questions were answered, and informed consent                            was obtained. Prior Anticoagulants: The patient has                            taken Eliquis (apixaban), last dose was 2 days                            prior to procedure. ASA Grade Assessment: III - A  patient with severe systemic disease. After                            reviewing the risks and benefits, the patient was                            deemed in satisfactory condition to undergo the                            procedure.                           - Sedation was administered by an anesthesia                            professional. Deep sedation was attained.  After obtaining informed consent, the colonoscope                            was passed under direct vision. Throughout the                            procedure, the patient's blood pressure, pulse, and                            oxygen saturations were monitored continuously. The                            EC-[id number] ([id number]) scope was introduced through                            the anus and advanced to the the cecum, identified                            by appendiceal orifice and ileocecal valve. The                            colonoscopy was performed without difficulty. The                            patient tolerated the procedure well. The quality                            of the bowel preparation was excellent. The                            ileocecal valve, appendiceal orifice, and rectum                            were photographed. Scope In: 10:48:21 AM Scope Out: 11:07:30 AM Scope Withdrawal Time: 0 hours 16 minutes 19 seconds  Total Procedure Duration: 0 hours 19 minutes 9 seconds  Findings:      Four sessile polyps were found in the transverse colon. The polyps were       3 to 4 mm in size.  These polyps were removed with a cold snare.       Resection and retrieval were complete.      A single medium-mouthed diverticulum was found in the cecum.      There was a medium-sized lipoma, in the transverse colon. Impression:               - Four 3 to 4 mm polyps in the transverse colon,                            removed with a cold snare. Resected and retrieved.                           - Diverticulosis in the cecum.                           - Medium-sized lipoma in the transverse colon. Moderate Sedation:      N/A- Per Anesthesia Care Recommendation:           - Patient has a contact number available for                            emergencies. The signs and symptoms of potential                            delayed complications were discussed with the                             patient. Return to normal activities tomorrow.                            Written discharge instructions were provided to the                            patient.                           - Resume previous diet.                           - Continue present medications.                           - Await pathology results.                           - Repeat colonoscopy in 3 years for surveillance.                           - Resume Eliquis. Procedure Code(s):        --- Professional ---                           [phone], Colonoscopy, flexible; with removal of                            tumor(s), polyp(s), or other lesion(s) by snare  technique Diagnosis Code(s):        --- Professional ---                           D12.3, Benign neoplasm of transverse colon (hepatic                            flexure or splenic flexure)                           Z86.010, Personal history of colonic polyps                           D17.5, Benign lipomatous neoplasm of                            intra-abdominal organs                           K57.30, Diverticulosis of large intestine without                            perforation or abscess without bleeding CPT copyright 2017 American Medical Association. All rights reserved. The codes documented in this report are preliminary and upon coder review may  be revised to meet current compliance requirements. Carol Ada, MD Carol Ada, MD 08/22/2017 11:13:38 AM This report has been signed electronically. Number of Addenda: 0

## 2017-08-22 NOTE — Transfer of Care (Signed)
Immediate Anesthesia Transfer of Care Note  Patient: Joseph [name]  Procedure(s) Performed: COLONOSCOPY WITH PROPOFOL (N/A )  Patient Location: PACU  Anesthesia Type:MAC  Level of Consciousness: sedated  Airway & Oxygen Therapy: Patient Spontanous Breathing and Patient connected to nasal cannula oxygen  Post-op Assessment: Report given to RN and Post -op Vital signs reviewed and stable  Post vital signs: Reviewed and stable  Last Vitals:  Vitals Value Taken Time  BP    Temp    Pulse    Resp    SpO2      Last Pain:  Vitals:   08/22/17 1031  TempSrc: Oral         Complications: No apparent anesthesia complications

## 2017-08-22 NOTE — Anesthesia Preprocedure Evaluation (Signed)
Anesthesia Evaluation  Patient identified by MRN, date of birth, ID band Patient awake  General Assessment Comment:HTN 2) NICM: LHC (06/2013): Mild coronary obstructive disease with 30% narrowing in the mid AV groove circumflex, and 30% proximal and 40% mid RCA stenoses with otherwise normal LAD and ramus intermediate vessel. RHC (06/2013) RA: 6, RV: 26/9, PA: 26/13, PC: a 18 mean 13, CO/CI Fick: 3.7/2.0, left ventriculography EF 15%; ECHO (06/2013) EF 20%, diffuse HK, mild MR; TEE (07/2013) no thrombus; ECHO (10/2013): EF 20-25%, diff HK, LA mod dilated; [employer] ICD implanted (12/01/13). Echo (4/16) with EF 30-35%, diffuse hypokinesis, normal RV.  3) DM2 4) Stroke: MRI (07/2013) revealed an acute infarction, with a greater than 3 cm infarct involving left frontal opercular cortex and subcortical white matter. Carotid dopplers (07/2013) mild soft plaque origin ICA. 1-39% ICA stenosis. Vertebral artery flow is antegrade 5) Polysubstance abuse: + cocaine UDS in past.  6) Atrial fibrillation: Paroxysmal, started Eliquis in 1/16  Reviewed: Allergy & Precautions, NPO status , Patient's Chart, lab work & pertinent test results, reviewed documented beta blocker date and time   History of Anesthesia Complications Negative for: history of anesthetic complications  Airway Mallampati: II  TM Distance: >3 FB Neck ROM: Full    Dental no notable dental hx. (+) Dental Advisory Given   Pulmonary Current Smoker, former smoker,    Pulmonary exam normal breath sounds clear to auscultation       Cardiovascular hypertension, Pt. on home beta blockers and Pt. on medications + CAD and +CHF  Normal cardiovascular exam+ Cardiac Defibrillator II Rhythm:Regular Rate:Normal     Neuro/Psych CVA negative psych ROS   GI/Hepatic negative GI ROS, Neg liver ROS,   Endo/Other  diabetes, Insulin Dependent, Oral Hypoglycemic Agents  Renal/GU negative Renal ROS   negative genitourinary   Musculoskeletal negative musculoskeletal ROS (+)   Abdominal (+) + obese,   Peds negative pediatric ROS (+)  Hematology negative hematology ROS (+)   Anesthesia Other Findings Joseph [name]  CUP PACEART REMOTE DEVICE CHECK  Order# [id number]  Ordering physician: Deboraha Sprang, MD Study date: 08/11/2017 Result Notes for CUP PACEART REMOTE DEVICE CHECK   Notes recorded by Deboraha Sprang, MD on 08/21/2017 at 4:43 PM EDT Device remote reviewed. Remote is normal. Battery status is good. Lead measurements unchanged. Histograms are appropriate.   Conclusion   Normal Remote Reviewed [facility]     Reproductive/Obstetrics negative OB ROS                             Anesthesia Physical  Anesthesia Plan  ASA: IV  Anesthesia Plan: MAC   Post-op Pain Management:    Induction: Intravenous  PONV Risk Score and Plan:   Airway Management Planned: Natural Airway and Simple Face Mask  Additional Equipment:   Intra-op Plan:   Post-operative Plan:   Informed Consent: I have reviewed the patients History and Physical, chart, labs and discussed the procedure including the risks, benefits and alternatives for the proposed anesthesia with the patient or authorized representative who has indicated his/her understanding and acceptance.   Dental advisory given  Plan Discussed with: CRNA  Anesthesia Plan Comments:         Anesthesia Quick Evaluation

## 2017-08-25 ENCOUNTER — Encounter (HOSPITAL_COMMUNITY): Payer: Self-pay | Admitting: Gastroenterology

## 2017-09-03 ENCOUNTER — Telehealth: Payer: Self-pay

## 2017-09-03 NOTE — Telephone Encounter (Signed)
Phone call from [place] that Sildenafil needs PA. Form placed in MD's box for completion along with Medicaid formulary.  Danley Danker, RN [facility] RN)

## 2017-09-09 ENCOUNTER — Encounter: Payer: Medicaid Other | Admitting: Physician Assistant

## 2017-09-10 NOTE — Telephone Encounter (Signed)
Spoke with PCP. He thinks patient pays cash for this. Patient has not called and no further requests from pharmacy. Coupons have been given in past. Will not attempt PA unless patient calls.  Danley Danker, RN [facility] RN)

## 2017-09-11 ENCOUNTER — Other Ambulatory Visit (HOSPITAL_COMMUNITY): Payer: Self-pay | Admitting: Cardiology

## 2017-09-11 ENCOUNTER — Other Ambulatory Visit: Payer: Self-pay | Admitting: Family Medicine

## 2017-09-11 DIAGNOSIS — E118 Type 2 diabetes mellitus with unspecified complications: Secondary | ICD-10-CM

## 2017-09-11 DIAGNOSIS — Z794 Long term (current) use of insulin: Principal | ICD-10-CM

## 2017-10-02 ENCOUNTER — Encounter: Payer: Medicaid Other | Admitting: Nurse Practitioner

## 2017-10-15 ENCOUNTER — Other Ambulatory Visit (HOSPITAL_COMMUNITY): Payer: Self-pay | Admitting: Cardiology

## 2017-10-15 ENCOUNTER — Other Ambulatory Visit: Payer: Self-pay | Admitting: Family Medicine

## 2017-10-15 DIAGNOSIS — E118 Type 2 diabetes mellitus with unspecified complications: Secondary | ICD-10-CM

## 2017-10-15 DIAGNOSIS — Z794 Long term (current) use of insulin: Principal | ICD-10-CM

## 2017-10-15 MED ORDER — SPIRONOLACTONE 25 MG PO TABS: 25.0000 mg | ORAL_TABLET | Freq: Every day | ORAL | 0 refills | Status: DC

## 2017-10-22 ENCOUNTER — Ambulatory Visit (INDEPENDENT_AMBULATORY_CARE_PROVIDER_SITE_OTHER): Payer: Medicaid Other | Admitting: *Deleted

## 2017-10-22 ENCOUNTER — Telehealth: Payer: Self-pay | Admitting: Cardiology

## 2017-10-22 DIAGNOSIS — I5022 Chronic systolic (congestive) heart failure: Secondary | ICD-10-CM

## 2017-10-22 DIAGNOSIS — I428 Other cardiomyopathies: Secondary | ICD-10-CM | POA: Diagnosis not present

## 2017-10-22 NOTE — Telephone Encounter (Signed)
LMOVM reminding pt to send remote transmission.   

## 2017-10-23 NOTE — Progress Notes (Signed)
Remote ICD transmission.   

## 2017-10-24 ENCOUNTER — Encounter: Payer: Self-pay | Admitting: Cardiology

## 2017-10-30 ENCOUNTER — Other Ambulatory Visit: Payer: Self-pay | Admitting: Family Medicine

## 2017-11-10 NOTE — Progress Notes (Signed)
Electrophysiology Office Note Date: 11/13/2017  ID:  Joseph [name], DOB 07-21-1965, MRN [id number]  PCP: Marjie Skiff, MD Primary Cardiologist: Aundra Dubin Electrophysiologist: Caryl Comes  CC: Routine ICD follow-up  Joseph [name] is a 52 y.o. male seen today for Dr Caryl Comes.  He presents today for routine electrophysiology followup.  Since last being seen in our clinic, the patient reports doing very well. He denies chest pain, palpitations, dyspnea, PND, orthopnea, nausea, vomiting, dizziness, syncope, edema, weight gain, or early satiety.  He has not had ICD shocks. He continues to smoke.   Device History: BSX single chamber ICD implanted 2015 for NICM History of appropriate therapy: No History of AAD therapy: No   Past Medical History:  Diagnosis Date  . Automatic implantable cardioverter-defibrillator in situ 12/01/2013   SINGLE CHAMBER BY DR Olin Pia  . CAD (coronary artery disease)    a. 06/28/13 non obst dz. managed medically  . CHF (congestive heart failure) ([place])   . High cholesterol   . Hypertension   . Non-ischemic cardiomyopathy ([place])    a. 06/28/13 ECHO: EF 25% and global hypokinesis and cath wuth EF 15%   . Pneumonia ?2004  . Shortness of breath   . Stroke ([place])   . Type II diabetes mellitus ([place])    Past Surgical History:  Procedure Laterality Date  . COLONOSCOPY WITH PROPOFOL N/A 07/08/2014   Procedure: COLONOSCOPY WITH PROPOFOL;  Surgeon: Carol Ada, MD;  Location: [facility] ENDOSCOPY;  Service: Endoscopy;  Laterality: N/A;  . COLONOSCOPY WITH PROPOFOL N/A 08/22/2017   Procedure: COLONOSCOPY WITH PROPOFOL;  Surgeon: Carol Ada, MD;  Location: [facility] ENDOSCOPY;  Service: Endoscopy;  Laterality: N/A;  . EP IMPLANTABLE DEVICE  12/01/2013   single chamber by dr Caryl Comes  . FINGER AMPUTATION Right 1987   "reattached middle and ring fingers"  . IMPLANTABLE CARDIOVERTER DEFIBRILLATOR IMPLANT N/A 12/01/2013   Procedure: IMPLANTABLE CARDIOVERTER DEFIBRILLATOR IMPLANT;  Surgeon:  Deboraha Sprang, MD;  Location: [facility] CATH LAB;  Service: Cardiovascular;  Laterality: N/A;  . LEFT HEART CATHETERIZATION WITH CORONARY ANGIOGRAM N/A 06/30/2013   Procedure: LEFT HEART CATHETERIZATION WITH CORONARY ANGIOGRAM;  Surgeon: Troy Sine, MD;  [facility] C[facility] LAB;  Service: Cardiovascular;  Laterality: N/A;  . TEE WITHOUT CARDIOVERSION N/A 07/06/2013   Procedure: TRANSESOPHAGEAL ECHOCARDIOGRAM (TEE);  Surgeon: [name], MD;  Location: [facility] ENDOSCOPY;  Service: Cardiovascular;  Laterality: N/A;    Current Outpatient Medications  Medication Sig Dispense Refill  . apixaban (ELIQUIS) 5 MG TABS tablet Take 1 tablet (5 mg total) by mouth 2 (two) times daily. 180 tablet 1  . atorvastatin (LIPITOR) 80 MG tablet TAKE 1 TABLET BY MOUTH EVERY EVENING 90 tablet 3  . carvedilol (COREG) 25 MG tablet TAKE 1 TABLET BY MOUTH 2 TIMES DAILY WITH A MEAL. 60 tablet 0  . ENTRESTO 97-103 MG TAKE ONE TABLET BY MOUTH 2 TIMES A DAY. NEED APPOINTMENT FOR MORE REFILLS 60 tablet 0  . furosemide (LASIX) 40 MG tablet Take 0.5 tablets (20 mg total) by mouth daily. 90 tablet 3  . gabapentin (NEURONTIN) 300 MG capsule TAKE ONE CAPSULE BY MOUTH 3 TIMES A DAY 90 capsule 0  . glucose blood (ACCU-CHEK AVIVA) test strip Use as instructed 100 each 12  . insulin aspart (NOVOLOG FLEXPEN) 100 UNIT/ML FlexPen Inject 15-20 Units into the skin 3 (three) times daily with meals. Take 15 if CBGs 80-199 and 20 units if > 200 (Patient taking differently: Inject 15 Units into the skin 3 (three)  times daily with meals. ) 15 mL 11  . Insulin Syringes, Disposable, U-100 0.3 ML MISC 1 each by Does not apply route daily. 100 each 1  . LANTUS SOLOSTAR 100 UNIT/ML Solostar Pen INJECT 35 UNITS UNDER THE SKIN EVERY DAY AT 10PM 15 mL 0  . metFORMIN (GLUCOPHAGE) 1000 MG tablet Take 1 tablet (1,000 mg total) by mouth 2 (two) times daily with a meal. 60 tablet 2  . metFORMIN (GLUCOPHAGE) 1000 MG tablet TAKE ONE TABLET BY MOUTH 2 TIMES A DAY WITH  A MEAL 60 tablet 0  . metFORMIN (GLUCOPHAGE) 1000 MG tablet TAKE ONE TABLET BY MOUTH 2 TIMES A DAY WITH A MEAL 60 tablet 0  . metFORMIN (GLUCOPHAGE) 1000 MG tablet TAKE ONE TABLET BY MOUTH 2 TIMES A DAY WITH A MEAL 60 tablet 0  . PAZEO 0.7 % SOLN Place 1 drop into both eyes daily.  3  . ReliOn Ultra Thin Lancets MISC Patient will need to test his blood glucose once daily prior to breakfast and metformin and amaryl administration. 100 each 2  . RESTASIS 0.05 % ophthalmic emulsion Place 1 drop into both eyes 2 (two) times daily.  3  . sildenafil (REVATIO) 20 MG tablet TAKE ONE TABLET BY MOUTH DAILY AS NEEDED (Patient taking differently: TAKE ONE TABLET BY MOUTH DAILY AS NEEDED erectile dysfunction) 10 tablet 0  . spironolactone (ALDACTONE) 25 MG tablet Take 1 tablet (25 mg total) by mouth daily. 60 tablet 0  . UNIFINE PENTIPS 31G X 6 MM MISC USE AS DIRECTED WITH INSULIN 100 each 0   No current facility-administered medications for this visit.     Allergies:   Tape   Social History: Social History   Socioeconomic History  . Marital status: Divorced    Spouse name: Not on file  . Number of children: Not on file  . Years of education: Not on file  . Highest education level: Not on file  Occupational History  . Not on file  Social Needs  . Financial resource strain: Not on file  . Food insecurity:    Worry: Not on file    Inability: Not on file  . Transportation needs:    Medical: Not on file    Non-medical: Not on file  Tobacco Use  . Smoking status: Former Smoker    Packs/day: 0.50    Years: 31.00    Pack years: 15.50    Types: Cigarettes    Last attempt to quit: 12/24/2016    Years since quitting: 0.8  . Smokeless tobacco: Never Used  . Tobacco comment: Quit for 1 month in past - previous 1 ppd   Substance and Sexual Activity  . Alcohol use: Yes    Alcohol/week: 32.0 standard drinks    Types: 21 Cans of beer, 11 Shots of liquor per week    Comment: 06/29/2013 "2-3  beers/day; maybe 1 pint/wk"  . Drug use: Yes    Types: Cocaine    Comment: 06/29/2013 "last used cocaine ~ 06/25/2013; use it ~ once/month"  . Sexual activity: Yes  Lifestyle  . Physical activity:    Days per week: Not on file    Minutes per session: Not on file  . Stress: Not on file  Relationships  . Social connections:    Talks on phone: Not on file    Gets together: Not on file    Attends religious service: Not on file    Active member of club or organization: Not on file  Attends meetings of clubs or organizations: Not on file    Relationship status: Not on file  . Intimate partner violence:    Fear of current or ex partner: Not on file    Emotionally abused: Not on file    Physically abused: Not on file    Forced sexual activity: Not on file  Other Topics Concern  . Not on file  Social History Narrative  . Not on file     Review of Systems: All other systems reviewed and are otherwise negative except as noted above.   Physical Exam: VS:  BP 124/88   Pulse 94   Ht 5\' 5"  (1.651 m)   Wt 205 lb 6.4 oz (93.2 kg)   SpO2 95%   BMI 34.18 kg/m  , BMI Body mass index is 34.18 kg/m.  GEN- The patient is well appearing, alert and oriented x 3 today.   HEENT: normocephalic, atraumatic; sclera clear, conjunctiva pink; hearing intact; oropharynx clear; neck supple  Lungs- Clear to ausculation bilaterally, normal work of breathing.  No wheezes, rales, rhonchi Heart- Regular rate and rhythm  GI- soft, non-tender, non-distended, bowel sounds present Extremities- no clubbing, cyanosis, or edema  MS- no significant deformity or atrophy Skin- warm and dry, no rash or lesion; ICD pocket well healed Psych- euthymic mood, full affect Neuro- strength and sensation are intact  ICD interrogation- reviewed in detail today,  See PACEART report  EKG:  EKG is not ordered today.  Recent Labs: 11/20/2016: B Natriuretic Peptide 10.2 02/14/2017: Hemoglobin 12.8; Platelets  174 02/20/2017: BUN 12; Creatinine, Ser 0.95; Potassium 3.8; Sodium 139   Wt Readings from Last 3 Encounters:  11/13/17 205 lb 6.4 oz (93.2 kg)  08/22/17 211 lb (95.7 kg)  06/17/17 211 lb (95.7 kg)     Assessment and Plan:  1.  Chronic systolic dysfunction euvolemic today Stable on an appropriate medical regimen Normal ICD function See Pace Art report No changes today Continue follow up in AHF clinic  2.  Paroxysmal atrial fibrillation Maintaining SR by symptoms (single chamber ICD) Continue Eliquis for CHADS2VASC of 4 No bleeding issues  3.  OSA Compliance with CPAP recommended   Current medicines are reviewed at length with the patient today.   The patient does not have concerns regarding his medicines.  The following changes were made today:  none  Labs/ tests ordered today include: none No orders of the defined types were placed in this encounter.    Disposition:   Follow up with [facility], Dr Caryl Comes 1 year, AHF as scheduled      Signed, Chanetta Marshall, NP 11/13/2017 9:51 AM  [facility] [address] [place] [address] [phone] (office) [phone] (fax)

## 2017-11-11 ENCOUNTER — Other Ambulatory Visit: Payer: Self-pay | Admitting: Family Medicine

## 2017-11-11 ENCOUNTER — Other Ambulatory Visit (HOSPITAL_COMMUNITY): Payer: Self-pay | Admitting: Cardiology

## 2017-11-11 DIAGNOSIS — Z794 Long term (current) use of insulin: Principal | ICD-10-CM

## 2017-11-11 DIAGNOSIS — E118 Type 2 diabetes mellitus with unspecified complications: Secondary | ICD-10-CM

## 2017-11-13 ENCOUNTER — Encounter: Payer: Self-pay | Admitting: Nurse Practitioner

## 2017-11-13 ENCOUNTER — Other Ambulatory Visit: Payer: Self-pay | Admitting: Family Medicine

## 2017-11-13 ENCOUNTER — Ambulatory Visit (INDEPENDENT_AMBULATORY_CARE_PROVIDER_SITE_OTHER): Payer: Medicaid Other | Admitting: Nurse Practitioner

## 2017-11-13 VITALS — BP 124/88 | HR 94 | Ht 65.0 in | Wt 205.4 lb

## 2017-11-13 DIAGNOSIS — Z9989 Dependence on other enabling machines and devices: Secondary | ICD-10-CM | POA: Diagnosis not present

## 2017-11-13 DIAGNOSIS — G4733 Obstructive sleep apnea (adult) (pediatric): Secondary | ICD-10-CM | POA: Diagnosis not present

## 2017-11-13 DIAGNOSIS — I5022 Chronic systolic (congestive) heart failure: Secondary | ICD-10-CM

## 2017-11-13 DIAGNOSIS — E118 Type 2 diabetes mellitus with unspecified complications: Secondary | ICD-10-CM

## 2017-11-13 DIAGNOSIS — I480 Paroxysmal atrial fibrillation: Secondary | ICD-10-CM | POA: Diagnosis not present

## 2017-11-13 DIAGNOSIS — Z794 Long term (current) use of insulin: Principal | ICD-10-CM

## 2017-11-13 LAB — CUP PACEART INCLINIC DEVICE CHECK
Implantable Lead Implant Date: 20150930
Implantable Lead Location: 753860
Implantable Lead Model: 180
Implantable Lead Serial Number: 311831
Implantable Pulse Generator Implant Date: 20150930
MDC IDC PG SERIAL: 191604
MDC IDC SESS DTM: 20190912095322

## 2017-11-13 NOTE — Patient Instructions (Addendum)
Medication Instructions:   Your physician recommends that you continue on your current medications as directed. Please refer to the Current Medication list given to you today.    If you need a refill on your cardiac medications before your next appointment, please call your pharmacy.  Labwork: NONE ORDERED  TODAY    Testing/Procedures: NONE ORDERED  TODAY    Follow-Up:   Your physician wants you to follow-up in: [place] will receive a reminder letter in the mail two months in advance. If you don't receive a letter, please call our office to schedule the follow-up appointment.  Remote monitoring is used to monitor your Pacemaker of ICD from home. This monitoring reduces the number of office visits required to check your device to one time per year. It allows [place] to keep an eye on the functioning of your device to ensure it is working properly. You are scheduled for a device check from home on . 01-22-18 You may send your transmission at any time that day. If you have a wireless device, the transmission will be sent automatically. After your physician reviews your transmission, you will receive a postcard with your next transmission date.    Any Other Special Instructions Will Be Listed Below (If Applicable).

## 2017-11-17 ENCOUNTER — Other Ambulatory Visit: Payer: Self-pay | Admitting: Family Medicine

## 2017-11-17 ENCOUNTER — Other Ambulatory Visit (HOSPITAL_COMMUNITY): Payer: Self-pay | Admitting: Cardiology

## 2017-11-17 DIAGNOSIS — E118 Type 2 diabetes mellitus with unspecified complications: Secondary | ICD-10-CM

## 2017-11-17 DIAGNOSIS — Z794 Long term (current) use of insulin: Principal | ICD-10-CM

## 2017-11-25 ENCOUNTER — Encounter: Payer: Self-pay | Admitting: Family Medicine

## 2017-11-25 ENCOUNTER — Other Ambulatory Visit: Payer: Self-pay

## 2017-11-25 ENCOUNTER — Ambulatory Visit: Payer: Medicaid Other | Admitting: Family Medicine

## 2017-11-25 VITALS — BP 128/78 | HR 82 | Temp 98.6°F | Ht 65.0 in | Wt 206.2 lb

## 2017-11-25 DIAGNOSIS — Z23 Encounter for immunization: Secondary | ICD-10-CM

## 2017-11-25 DIAGNOSIS — E118 Type 2 diabetes mellitus with unspecified complications: Secondary | ICD-10-CM | POA: Diagnosis not present

## 2017-11-25 DIAGNOSIS — Z794 Long term (current) use of insulin: Secondary | ICD-10-CM

## 2017-11-25 DIAGNOSIS — I10 Essential (primary) hypertension: Secondary | ICD-10-CM

## 2017-11-25 LAB — POCT GLYCOSYLATED HEMOGLOBIN (HGB A1C): HBA1C, POC (CONTROLLED DIABETIC RANGE): 7.1 % — AB (ref 0.0–7.0)

## 2017-11-25 MED ORDER — EMPAGLIFLOZIN 10 MG PO TABS: 10.0000 mg | ORAL_TABLET | Freq: Every day | ORAL | 1 refills | Status: DC

## 2017-11-25 NOTE — Progress Notes (Signed)
Subjective:    Patient ID: Joseph [name], male    DOB: 08/23/1965, 52 y.o.   MRN: [id number]   CC: Type 2 diabetes follow-up  HPI: Patient is a 52 year old male history significant for type 2 diabetes, CAD, CHF who presents today to follow-up on management of type 2 diabetes.  Patient reports that he was afraid that his A1c was going to increase because he has not been eating as well as he used to.  His wife recently had surgery and they went on vacation after that.  Has not been able to control his diet as much.  He continued to be adherent to his medication.  He denies any polyuria, polydipsia.  Smoking status reviewed   ROS: all other systems were reviewed and are negative other than in the HPI   Past Medical History:  Diagnosis Date  . Automatic implantable cardioverter-defibrillator in situ 12/01/2013   SINGLE CHAMBER BY DR Olin Pia  . CAD (coronary artery disease)    a. 06/28/13 non obst dz. managed medically  . CHF (congestive heart failure) ([place])   . High cholesterol   . Hypertension   . Non-ischemic cardiomyopathy ([place])    a. 06/28/13 ECHO: EF 25% and global hypokinesis and cath wuth EF 15%   . Pneumonia ?2004  . Shortness of breath   . Stroke ([place])   . Type II diabetes mellitus ([place])     Past Surgical History:  Procedure Laterality Date  . COLONOSCOPY WITH PROPOFOL N/A 07/08/2014   Procedure: COLONOSCOPY WITH PROPOFOL;  Surgeon: Carol Ada, MD;  [facility];  Service: Endoscopy;  Laterality: N/A;  . COLONOSCOPY WITH PROPOFOL N/A 08/22/2017   Procedure: COLONOSCOPY WITH PROPOFOL;  Surgeon: Carol Ada, MD;  [facility];  Service: Endoscopy;  Laterality: N/A;  . EP IMPLANTABLE DEVICE  12/01/2013   single chamber by dr Caryl Comes  . FINGER AMPUTATION Right 1987   "reattached middle and ring fingers"  . IMPLANTABLE CARDIOVERTER DEFIBRILLATOR IMPLANT N/A 12/01/2013   Procedure: IMPLANTABLE CARDIOVERTER DEFIBRILLATOR IMPLANT;  Surgeon: Deboraha Sprang, MD;   Location: [facility] CATH LAB;  Service: Cardiovascular;  Laterality: N/A;  . LEFT HEART CATHETERIZATION WITH CORONARY ANGIOGRAM N/A 06/30/2013   Procedure: LEFT HEART CATHETERIZATION WITH CORONARY ANGIOGRAM;  Surgeon: Troy Sine, MD;  Location: [facility] CATH LAB;  Service: Cardiovascular;  Laterality: N/A;  . TEE WITHOUT CARDIOVERSION N/A 07/06/2013   Procedure: TRANSESOPHAGEAL ECHOCARDIOGRAM (TEE);  Surgeon: [name], MD;  Location: [facility] ENDOSCOPY;  Service: Cardiovascular;  Laterality: N/A;    Past medical history, surgical, family, and social history reviewed and updated in the EMR as appropriate.  Objective:  BP 128/78   Pulse 82   Temp 98.6 F (37 C) (Oral)   Ht 5\' 5"  (1.651 m)   Wt 206 lb 3.2 oz (93.5 kg)   SpO2 98%   BMI 34.31 kg/m   Vitals and nursing note reviewed  General: NAD, pleasant, able to participate in exam Cardiac: RRR, normal heart sounds, no murmurs. 2+ radial and PT pulses bilaterally Respiratory: CTAB, normal effort, No wheezes, rales or rhonchi Abdomen: soft, nontender, nondistended, no hepatic or splenomegaly, +BS Extremities: no edema or cyanosis. WWP. Skin: warm and dry, no rashes noted Neuro: alert and oriented x4, no focal deficits Psych: Normal affect and mood   Assessment & Plan:    Long-term insulin use in type 2 diabetes (HCC) A1c today is 7.1 down from 7.7.  Patient is at goal.  Is currently on 64  units of Lantus and NovoLog sliding scale.  Given history of CHF will add Januvia 10 mg.  We will decrease Lantus to 30 units daily.  Will continue current NovoLog sliding scale.  Hypoglycemia precautions have been discussed.  Patient is in agreement with plan.  He will follow-up in 3 months. --We will order CMP, CBC, and lipid panel  HTN (hypertension) Blood pressure today is 120/78.  Patient is at goal.  Will not make any changes.    Marjie Skiff, MD [place] PGY-3

## 2017-11-25 NOTE — Patient Instructions (Signed)
It was great seeing you today! We have addressed the following issues today  1. We will start you on Jardiance a new medication for your diabetes. 2. I will decrease your Lantus to 30 units from 35 units. Keep the novolog the same.   If we did any lab work today, and the results require attention, either me or my nurse will get in touch with you. If everything is normal, you will get a letter in mail and a message via . If you don't hear from [place] in two weeks, please give [place] a call. Otherwise, we look forward to seeing you again at your next visit. If you have any questions or concerns before then, please call the clinic at [phone].  Please bring all your medications to every doctors visit  Sign up for My Chart to have easy access to your labs results, and communication with your Primary care physician. Please ask Front Desk for some assistance.   Please check-out at the front desk before leaving the clinic.    Take Care,   Dr. Andy Gauss

## 2017-11-25 NOTE — Assessment & Plan Note (Signed)
Blood pressure today is 120/78.  Patient is at goal.  Will not make any changes.

## 2017-11-25 NOTE — Assessment & Plan Note (Addendum)
A1c today is 7.1 down from 7.7.  Patient is at goal.  Is currently on 35 units of Lantus and NovoLog sliding scale.  Given history of CHF will add Januvia 10 mg.  We will decrease Lantus to 30 units daily.  Will continue current NovoLog sliding scale.  Hypoglycemia precautions have been discussed.  Patient is in agreement with plan.  He will follow-up in 3 months. --We will order CMP, CBC, and lipid panel

## 2017-11-26 LAB — CMP14+EGFR
ALK PHOS: 46 IU/L (ref 39–117)
ALT: 15 IU/L (ref 0–44)
AST: 14 IU/L (ref 0–40)
Albumin/Globulin Ratio: 1.7 (ref 1.2–2.2)
Albumin: 4 g/dL (ref 3.5–5.5)
BILIRUBIN TOTAL: 0.5 mg/dL (ref 0.0–1.2)
BUN/Creatinine Ratio: 12 (ref 9–20)
BUN: 13 mg/dL (ref 6–24)
CHLORIDE: 107 mmol/L — AB (ref 96–106)
CO2: 18 mmol/L — AB (ref 20–29)
CREATININE: 1.08 mg/dL (ref 0.76–1.27)
Calcium: 9.4 mg/dL (ref 8.7–10.2)
GFR calc Af Amer: 89 mL/min/{1.73_m2} (ref >59)
GFR calc non Af Amer: 77 mL/min/{1.73_m2} (ref >59)
GLUCOSE: 162 mg/dL — AB (ref 65–99)
Globulin, Total: 2.3 g/dL (ref 1.5–4.5)
Potassium: 5.1 mmol/L (ref 3.5–5.2)
Sodium: 140 mmol/L (ref 134–144)
Total Protein: 6.3 g/dL (ref 6.0–8.5)

## 2017-11-26 LAB — CBC WITH DIFFERENTIAL/PLATELET
Basophils Absolute: 0 10*3/uL (ref 0.0–0.2)
Basos: 0 %
EOS (ABSOLUTE): 0.2 10*3/uL (ref 0.0–0.4)
EOS: 2 %
HEMATOCRIT: 43.7 % (ref 37.5–51.0)
HEMOGLOBIN: 14.8 g/dL (ref 13.0–17.7)
IMMATURE GRANULOCYTES: 1 %
Immature Grans (Abs): 0.1 10*3/uL (ref 0.0–0.1)
LYMPHS ABS: 3.3 10*3/uL — AB (ref 0.7–3.1)
Lymphs: 33 %
MCH: 33 pg (ref 26.6–33.0)
MCHC: 33.9 g/dL (ref 31.5–35.7)
MCV: 98 fL — ABNORMAL HIGH (ref 79–97)
MONOCYTES: 8 %
Monocytes Absolute: 0.8 10*3/uL (ref 0.1–0.9)
NEUTROS PCT: 56 %
Neutrophils Absolute: 5.5 10*3/uL (ref 1.4–7.0)
Platelets: 196 10*3/uL (ref 150–450)
RBC: 4.48 x10E6/uL (ref 4.14–5.80)
RDW: 12.9 % (ref 12.3–15.4)
WBC: 9.9 10*3/uL (ref 3.4–10.8)

## 2017-11-26 LAB — LIPID PANEL
CHOL/HDL RATIO: 2.7 ratio (ref 0.0–5.0)
Cholesterol, Total: 149 mg/dL (ref 100–199)
HDL: 56 mg/dL (ref >39)
LDL Calculated: 61 mg/dL (ref 0–99)
TRIGLYCERIDES: 162 mg/dL — AB (ref 0–149)
VLDL CHOLESTEROL CAL: 32 mg/dL (ref 5–40)

## 2017-11-27 ENCOUNTER — Other Ambulatory Visit: Payer: Self-pay | Admitting: Family Medicine

## 2017-11-27 ENCOUNTER — Other Ambulatory Visit (HOSPITAL_COMMUNITY): Payer: Self-pay | Admitting: Cardiology

## 2017-11-27 DIAGNOSIS — Z794 Long term (current) use of insulin: Principal | ICD-10-CM

## 2017-11-27 DIAGNOSIS — E118 Type 2 diabetes mellitus with unspecified complications: Secondary | ICD-10-CM

## 2017-11-28 LAB — CUP PACEART REMOTE DEVICE CHECK
Implantable Pulse Generator Implant Date: 20150930
MDC IDC LEAD IMPLANT DT: 20150930
MDC IDC LEAD LOCATION: 753860
MDC IDC LEAD SERIAL: 311831
MDC IDC SESS DTM: 20190927112618
Pulse Gen Serial Number: 191604

## 2017-12-18 ENCOUNTER — Emergency Department (HOSPITAL_COMMUNITY)
Admission: EM | Admit: 2017-12-18 | Discharge: 2017-12-18 | Disposition: A | Discharge status: Home / Self Care | Payer: Self-pay | Attending: Emergency Medicine | Admitting: Emergency Medicine

## 2017-12-18 ENCOUNTER — Encounter (HOSPITAL_COMMUNITY): Payer: Self-pay | Admitting: Emergency Medicine

## 2017-12-18 ENCOUNTER — Emergency Department (HOSPITAL_COMMUNITY): Payer: Self-pay

## 2017-12-18 DIAGNOSIS — I5022 Chronic systolic (congestive) heart failure: Secondary | ICD-10-CM | POA: Insufficient documentation

## 2017-12-18 DIAGNOSIS — Z79899 Other long term (current) drug therapy: Secondary | ICD-10-CM | POA: Insufficient documentation

## 2017-12-18 DIAGNOSIS — N200 Calculus of kidney: Secondary | ICD-10-CM | POA: Insufficient documentation

## 2017-12-18 DIAGNOSIS — Z794 Long term (current) use of insulin: Secondary | ICD-10-CM | POA: Insufficient documentation

## 2017-12-18 DIAGNOSIS — E119 Type 2 diabetes mellitus without complications: Secondary | ICD-10-CM | POA: Insufficient documentation

## 2017-12-18 DIAGNOSIS — Z87891 Personal history of nicotine dependence: Secondary | ICD-10-CM | POA: Insufficient documentation

## 2017-12-18 DIAGNOSIS — I110 Hypertensive heart disease with heart failure: Secondary | ICD-10-CM | POA: Insufficient documentation

## 2017-12-18 DIAGNOSIS — I2510 Atherosclerotic heart disease of native coronary artery without angina pectoris: Secondary | ICD-10-CM | POA: Insufficient documentation

## 2017-12-18 LAB — URINALYSIS, ROUTINE W REFLEX MICROSCOPIC
Bilirubin Urine: NEGATIVE
KETONES UR: 5 mg/dL — AB
LEUKOCYTES UA: NEGATIVE
NITRITE: NEGATIVE
PH: 5 (ref 5.0–8.0)
Protein, ur: NEGATIVE mg/dL
Specific Gravity, Urine: 1.02 (ref 1.005–1.030)

## 2017-12-18 LAB — CBC
HCT: 42.5 % (ref 39.0–52.0)
HEMOGLOBIN: 14 g/dL (ref 13.0–17.0)
MCH: 32.9 pg (ref 26.0–34.0)
MCHC: 32.9 g/dL (ref 30.0–36.0)
MCV: 99.8 fL (ref 80.0–100.0)
NRBC: 0 % (ref 0.0–0.2)
PLATELETS: 172 10*3/uL (ref 150–400)
RBC: 4.26 MIL/uL (ref 4.22–5.81)
RDW: 13.6 % (ref 11.5–15.5)
WBC: 12.9 10*3/uL — AB (ref 4.0–10.5)

## 2017-12-18 LAB — COMPREHENSIVE METABOLIC PANEL
ALK PHOS: 41 U/L (ref 38–126)
ALT: 27 U/L (ref 0–44)
ANION GAP: 9 (ref 5–15)
AST: 25 U/L (ref 15–41)
Albumin: 3.7 g/dL (ref 3.5–5.0)
BUN: 11 mg/dL (ref 6–20)
CO2: 20 mmol/L — ABNORMAL LOW (ref 22–32)
Calcium: 9.2 mg/dL (ref 8.9–10.3)
Chloride: 109 mmol/L (ref 98–111)
Creatinine, Ser: 1.25 mg/dL — ABNORMAL HIGH (ref 0.61–1.24)
GFR calc non Af Amer: >60 mL/min (ref >60)
GLUCOSE: 164 mg/dL — AB (ref 70–99)
POTASSIUM: 4 mmol/L (ref 3.5–5.1)
Sodium: 138 mmol/L (ref 135–145)
Total Bilirubin: 1 mg/dL (ref 0.3–1.2)
Total Protein: 6.4 g/dL — ABNORMAL LOW (ref 6.5–8.1)

## 2017-12-18 LAB — LIPASE, BLOOD: Lipase: 33 U/L (ref 11–51)

## 2017-12-18 MED ORDER — TAMSULOSIN HCL 0.4 MG PO CAPS: 0.4000 mg | ORAL_CAPSULE | Freq: Every day | ORAL | 0 refills | Status: DC

## 2017-12-18 MED ORDER — OXYCODONE-ACETAMINOPHEN 5-325 MG PO TABS
1.0000 | ORAL_TABLET | Freq: Once | ORAL | Status: AC
  Administered 2017-12-18: 1 via ORAL
  Filled 2017-12-18: qty 1

## 2017-12-18 MED ORDER — ONDANSETRON 4 MG PO TBDP: 4.0000 mg | ORAL_TABLET | Freq: Three times a day (TID) | ORAL | 0 refills | Status: DC | PRN

## 2017-12-18 MED ORDER — OXYCODONE-ACETAMINOPHEN 5-325 MG PO TABS: 1.0000 | ORAL_TABLET | ORAL | 0 refills | Status: DC | PRN

## 2017-12-18 NOTE — ED Notes (Signed)
Patient transported to CT 

## 2017-12-18 NOTE — ED Triage Notes (Signed)
Patient reports RLQ pain with emesis today , denies diarrhea or fever , hypertensive at triage .

## 2017-12-18 NOTE — Discharge Instructions (Addendum)
Take medications as prescribed. Follow up with your urologist this week for recheck and to insure the stone passes without difficulty or complication. If you develop a fever, have uncontrolled pain or vomiting, return to the emergency department for further treatment.

## 2017-12-18 NOTE — ED Provider Notes (Signed)
[facility] EMERGENCY DEPARTMENT Provider Note   CSN: [id number] Arrival date & time: 12/18/17  0101     History   Chief Complaint Chief Complaint  Patient presents with  . Abdominal Pain    HPI Joseph [name] is a 52 y.o. male.  Patient to ED for evaluation of pain in the right side and RLQ abdomen that started earlier today and is associated with nausea. Vomiting x 1 episode that was non-bloody. No fever, change in bowel movements or groin pain. He reports his urine has appeared dark today, possibly like there was blood in it. He states he has had kidney stones in the past and current symptoms are similar.   The history is provided by the patient. No language interpreter was used.  Abdominal Pain   Associated symptoms include nausea, vomiting and hematuria. Pertinent negatives include fever.    Past Medical History:  Diagnosis Date  . Automatic implantable cardioverter-defibrillator in situ 12/01/2013   SINGLE CHAMBER BY DR Olin Pia  . CAD (coronary artery disease)    a. 06/28/13 non obst dz. managed medically  . CHF (congestive heart failure) ([place])   . High cholesterol   . Hypertension   . Non-ischemic cardiomyopathy ([place])    a. 06/28/13 ECHO: EF 25% and global hypokinesis and cath wuth EF 15%   . Pneumonia ?2004  . Shortness of breath   . Stroke ([place])   . Type II diabetes mellitus [facility])     Patient Active Problem List   Diagnosis Date Noted  . Generalized rash 01/07/2017  . ICD (implantable cardioverter-defibrillator), single, in situ 07/04/2016  . OSA on CPAP 08/22/2014  . Snoring 02/17/2014  . Other primary cardiomyopathies 12/01/2013  . Chronic systolic heart failure ([place]) 07/21/2013  . Polysubstance abuse ([place]) 07/21/2013  . Smoking 07/21/2013  . Long-term insulin use in type 2 diabetes ([place]) 07/07/2013  . HTN (hypertension) 07/07/2013  . Stroke ([place]) 07/04/2013  . HFrEF (heart failure with reduced ejection fraction) ([place]) 07/02/2013   Class: Diagnosis of    Past Surgical History:  Procedure Laterality Date  . COLONOSCOPY WITH PROPOFOL N/A 07/08/2014   Procedure: COLONOSCOPY WITH PROPOFOL;  Surgeon: Carol Ada, MD;  [facility];  Service: Endoscopy;  Laterality: N/A;  . COLONOSCOPY WITH PROPOFOL N/A 08/22/2017   Procedure: COLONOSCOPY WITH PROPOFOL;  Surgeon: Carol Ada, MD;  [facility];  Service: Endoscopy;  Laterality: N/A;  . EP IMPLANTABLE DEVICE  12/01/2013   single chamber by dr Caryl Comes  . FINGER AMPUTATION Right 1987   "reattached middle and ring fingers"  . IMPLANTABLE CARDIOVERTER DEFIBRILLATOR IMPLANT N/A 12/01/2013   Procedure: IMPLANTABLE CARDIOVERTER DEFIBRILLATOR IMPLANT;  Surgeon: Deboraha Sprang, MD;  Location: [facility] CATH LAB;  Service: Cardiovascular;  Laterality: N/A;  . LEFT HEART CATHETERIZATION WITH CORONARY ANGIOGRAM N/A 06/30/2013   Procedure: LEFT HEART CATHETERIZATION WITH CORONARY ANGIOGRAM;  Surgeon: Troy Sine, MD;  Location: [facility] CATH LAB;  Service: Cardiovascular;  Laterality: N/A;  . TEE WITHOUT CARDIOVERSION N/A 07/06/2013   Procedure: TRANSESOPHAGEAL ECHOCARDIOGRAM (TEE);  Surgeon: [name], MD;  Location: [facility] ENDOSCOPY;  Service: Cardiovascular;  Laterality: N/A;        Home Medications    Prior to Admission medications   Medication Sig Start Date End Date Taking? Authorizing Provider  apixaban (ELIQUIS) 5 MG TABS tablet Take 1 tablet (5 mg total) by mouth 2 (two) times daily. 08/05/17   Larey Dresser, MD  atorvastatin (LIPITOR) 80 MG tablet TAKE  1 TABLET BY MOUTH EVERY EVENING 04/24/17   Bensimhon, Shaune Pascal, MD  carvedilol (COREG) 25 MG tablet TAKE 1 TABLET BY MOUTH 2 TIMES DAILY WITH A MEAL. 11/18/17   Diallo, Earna Coder, MD  empagliflozin (JARDIANCE) 10 MG TABS tablet Take 10 mg by mouth daily. 11/25/17   Diallo, Earna Coder, MD  ENTRESTO 97-103 MG TAKE ONE TABLET BY MOUTH 2 TIMES A DAY. NEED APPOINTMENT FOR MORE REFILLS 11/11/17   Larey Dresser, MD    furosemide (LASIX) 40 MG tablet Take 0.5 tablets (20 mg total) by mouth daily. 02/14/17   Larey Dresser, MD  gabapentin (NEURONTIN) 300 MG capsule TAKE ONE CAPSULE BY MOUTH 3 TIMES A DAY 11/18/17   Diallo, Abdoulaye, MD  glucose blood (ACCU-CHEK AVIVA) test strip Use as instructed 06/17/17   Diallo, Earna Coder, MD  Insulin Syringes, Disposable, U-100 0.3 ML MISC 1 each by Does not apply route daily. 05/09/14   Olam Idler, MD  LANTUS SOLOSTAR 100 UNIT/ML Solostar Pen INJECT 35 UNITS UNDER THE SKIN EVERY DAY AT 10PM 11/27/17   Diallo, Earna Coder, MD  metFORMIN (GLUCOPHAGE) 1000 MG tablet Take 1 tablet (1,000 mg total) by mouth 2 (two) times daily with a meal. 11/07/16   Diallo, Abdoulaye, MD  metFORMIN (GLUCOPHAGE) 1000 MG tablet TAKE ONE TABLET BY MOUTH 2 TIMES A DAY WITH A MEAL 09/11/17   Diallo, Abdoulaye, MD  metFORMIN (GLUCOPHAGE) 1000 MG tablet TAKE ONE TABLET BY MOUTH 2 TIMES A DAY WITH A MEAL 10/15/17   Diallo, Abdoulaye, MD  metFORMIN (GLUCOPHAGE) 1000 MG tablet TAKE ONE TABLET BY MOUTH 2 TIMES A DAY WITH A MEAL 11/11/17   Diallo, Abdoulaye, MD  metFORMIN (GLUCOPHAGE) 1000 MG tablet TAKE ONE TABLET BY MOUTH 2 TIMES A DAY WITH A MEAL 11/18/17   Diallo, Abdoulaye, MD  NOVOLOG FLEXPEN 100 UNIT/ML FlexPen INJECT 15-20 UNITS SUBCUTANEOUSLY 3 TIMES A DAY WITH MEALS. TAKE 15 UNITS IF BG IS 80-199. TAKE 20 UNITS IF BG IS >200. 11/27/17   Diallo, Abdoulaye, MD  PAZEO 0.7 % SOLN Place 1 drop into both eyes daily. 07/10/17   [provider]  ReliOn Ultra Thin Lancets MISC Patient will need to test his blood glucose once daily prior to breakfast and metformin and amaryl administration. 07/07/13   Olam Idler, MD  RESTASIS 0.05 % ophthalmic emulsion Place 1 drop into both eyes 2 (two) times daily. 07/10/17   [provider]  sacubitril-valsartan (ENTRESTO) 97-103 MG Take 1 tablet by mouth 2 (two) times daily. Last refill w/o OV [phone] 11/18/17   Larey Dresser, MD  sildenafil (REVATIO) 20  MG tablet TAKE ONE TABLET BY MOUTH DAILY AS NEEDED Patient taking differently: TAKE ONE TABLET BY MOUTH DAILY AS NEEDED erectile dysfunction 08/12/17   Diallo, Earna Coder, MD  spironolactone (ALDACTONE) 25 MG tablet TAKE ONE TABLET BY MOUTH EVERY DAY. NEED APPT FOR MORE REFILLS 11/27/17   Larey Dresser, MD  UNIFINE PENTIPS 31G X 6 MM MISC USE AS DIRECTED WITH INSULIN 07/29/17   Marjie Skiff, MD    Family History No family history on file.  Social History Social History   Tobacco Use  . Smoking status: Former Smoker    Packs/day: 0.50    Years: 31.00    Pack years: 15.50    Types: Cigarettes    Last attempt to quit: 12/24/2016    Years since quitting: 0.9  . Smokeless tobacco: Never Used  . Tobacco comment: Quit for 1 month in past - previous  1 ppd   Substance Use Topics  . Alcohol use: Yes    Alcohol/week: 32.0 standard drinks    Types: 21 Cans of beer, 11 Shots of liquor per week    Comment: 06/29/2013 "2-3 beers/day; maybe 1 pint/wk"  . Drug use: Yes    Types: Cocaine    Comment: 06/29/2013 "last used cocaine ~ 06/25/2013; use it ~ once/month"     Allergies   Tape   Review of Systems Review of Systems  Constitutional: Negative for chills and fever.  Respiratory: Negative.   Cardiovascular: Negative.   Gastrointestinal: Positive for abdominal pain, nausea and vomiting.  Genitourinary: Positive for hematuria. Negative for flank pain, scrotal swelling and testicular pain.  Skin: Negative.   Neurological: Negative.      Physical Exam Updated Vital Signs BP (!) 175/86   Pulse 68   Temp 98.7 F (37.1 C) (Oral)   Resp 16   SpO2 96%   Physical Exam  Constitutional: He appears well-developed and well-nourished.  HENT:  Head: Normocephalic.  Neck: Normal range of motion. Neck supple.  Cardiovascular: Normal rate and regular rhythm.  Pulmonary/Chest: Effort normal and breath sounds normal.  Abdominal: Soft. Bowel sounds are normal. He exhibits no distension.  There is no tenderness. There is no rebound and no guarding.  There is no abdominal tenderness to palpation.  Genitourinary: Testes normal and penis normal. Right testis shows no tenderness. Left testis shows no tenderness.  Musculoskeletal: Normal range of motion.  Neurological: He is alert. No cranial nerve deficit.  Skin: Skin is warm and dry. No rash noted.  Psychiatric: He has a normal mood and affect.     ED Treatments / Results  Labs (all labs ordered are listed, but only abnormal results are displayed) Labs Reviewed  CBC - Abnormal; Notable for the following components:      Result Value   WBC 12.9 (*)    All other components within normal limits  LIPASE, BLOOD  COMPREHENSIVE METABOLIC PANEL  URINALYSIS, ROUTINE W REFLEX MICROSCOPIC    EKG None  Radiology No results found.  Procedures Procedures (including critical care time)  Medications Ordered in ED Medications - No data to display   Initial Impression / Assessment and Plan / ED Course  I have reviewed the triage vital signs and the nursing notes.  Pertinent labs & imaging results that were available during my care of the patient were reviewed by me and considered in my medical decision making (see chart for details).     Patient here for evaluation of RLQ abdominal pain that woke him from sleep. Pain affects lateral abdominal wall as well. No groin pain. He notices dark urine. Has a history of kidney stones.   Urine is positive for significant blood. CT ordered as patient is on Eliquis, to insure diagnosis of kidney stone.   CT shows 2 mm stone in distal right ureter. Feel this will pass without complication. Recommended follow up with urology this week. Return precautions discussed including fever, uncontrolled pain or vomiting. Rx: Flomax, Percocet, Zofran  Final Clinical Impressions(s) / ED Diagnoses   Final diagnoses:  None   1. Kidney stone, right  ED Discharge Orders    None         Charlann Lange, Hershal Coria 56/97/94 [id number]    Delora Fuel, MD 55/37/48 [phone]

## 2018-01-22 ENCOUNTER — Ambulatory Visit (INDEPENDENT_AMBULATORY_CARE_PROVIDER_SITE_OTHER): Payer: Self-pay

## 2018-01-22 DIAGNOSIS — I428 Other cardiomyopathies: Secondary | ICD-10-CM

## 2018-01-22 NOTE — Progress Notes (Signed)
Remote ICD transmission.   

## 2018-01-23 ENCOUNTER — Encounter: Payer: Self-pay | Admitting: Cardiology

## 2018-03-20 LAB — CUP PACEART REMOTE DEVICE CHECK
Brady Statistic RV Percent Paced: 0 %
Date Time Interrogation Session: 20191121081100
HighPow Impedance: 74 Ohm
Implantable Lead Implant Date: 20150930
Implantable Lead Model: 180
Implantable Lead Serial Number: 311831
Lead Channel Impedance Value: 406 Ohm
Lead Channel Pacing Threshold Amplitude: 0.9 V
Lead Channel Setting Pacing Amplitude: 2.4 V
MDC IDC LEAD LOCATION: 753860
MDC IDC MSMT BATTERY REMAINING LONGEVITY: 132 mo
MDC IDC MSMT BATTERY REMAINING PERCENTAGE: 100 %
MDC IDC MSMT LEADCHNL RV PACING THRESHOLD PULSEWIDTH: 0.4 ms
MDC IDC PG IMPLANT DT: 20150930
MDC IDC SET LEADCHNL RV PACING PULSEWIDTH: 0.4 ms
MDC IDC SET LEADCHNL RV SENSING SENSITIVITY: 0.6 mV
Pulse Gen Serial Number: 191604

## 2018-04-23 ENCOUNTER — Ambulatory Visit (INDEPENDENT_AMBULATORY_CARE_PROVIDER_SITE_OTHER): Payer: Self-pay

## 2018-04-23 ENCOUNTER — Ambulatory Visit: Payer: Self-pay

## 2018-04-23 DIAGNOSIS — I5022 Chronic systolic (congestive) heart failure: Secondary | ICD-10-CM

## 2018-04-23 DIAGNOSIS — I428 Other cardiomyopathies: Secondary | ICD-10-CM

## 2018-04-25 LAB — CUP PACEART REMOTE DEVICE CHECK
Brady Statistic RV Percent Paced: 0 %
HighPow Impedance: 72 Ohm
Implantable Lead Implant Date: 20150930
Implantable Lead Model: 180
Implantable Pulse Generator Implant Date: 20150930
Lead Channel Impedance Value: 411 Ohm
Lead Channel Pacing Threshold Amplitude: 0.9 V
Lead Channel Pacing Threshold Pulse Width: 0.4 ms
MDC IDC LEAD LOCATION: 753860
MDC IDC LEAD SERIAL: 311831
MDC IDC MSMT BATTERY REMAINING LONGEVITY: 132 mo
MDC IDC MSMT BATTERY REMAINING PERCENTAGE: 100 %
MDC IDC PG SERIAL: 191604
MDC IDC SESS DTM: 20200220082200
MDC IDC SET LEADCHNL RV PACING AMPLITUDE: 2.4 V
MDC IDC SET LEADCHNL RV PACING PULSEWIDTH: 0.4 ms
MDC IDC SET LEADCHNL RV SENSING SENSITIVITY: 0.6 mV

## 2018-04-30 NOTE — Progress Notes (Signed)
Remote ICD transmission.   

## 2018-05-01 ENCOUNTER — Encounter: Payer: Self-pay | Admitting: Cardiology

## 2018-06-08 ENCOUNTER — Telehealth: Payer: Self-pay | Admitting: Family Medicine

## 2018-06-08 ENCOUNTER — Other Ambulatory Visit: Payer: Self-pay

## 2018-06-08 ENCOUNTER — Telehealth: Payer: Self-pay

## 2018-06-08 NOTE — Telephone Encounter (Signed)
Per family member, patient thought appointment was for tomorrow (4/7) and is unavailable today. Appointment rescheduled for 06/10/18 at 2:30pm.

## 2018-06-10 ENCOUNTER — Telehealth (INDEPENDENT_AMBULATORY_CARE_PROVIDER_SITE_OTHER): Payer: Self-pay | Admitting: Family Medicine

## 2018-06-10 ENCOUNTER — Other Ambulatory Visit: Payer: Self-pay

## 2018-06-10 DIAGNOSIS — Z794 Long term (current) use of insulin: Secondary | ICD-10-CM

## 2018-06-10 DIAGNOSIS — E119 Type 2 diabetes mellitus without complications: Secondary | ICD-10-CM

## 2018-06-10 DIAGNOSIS — E118 Type 2 diabetes mellitus with unspecified complications: Secondary | ICD-10-CM

## 2018-06-10 MED ORDER — INSULIN ASPART 100 UNIT/ML FLEXPEN: PEN_INJECTOR | SUBCUTANEOUS | 0 refills | Status: DC

## 2018-06-10 MED ORDER — APIXABAN 5 MG PO TABS: 5.0000 mg | ORAL_TABLET | Freq: Two times a day (BID) | ORAL | 1 refills | Status: DC

## 2018-06-10 MED ORDER — INSULIN GLARGINE 100 UNIT/ML SOLOSTAR PEN: 30.0000 [IU] | PEN_INJECTOR | Freq: Every day | SUBCUTANEOUS | 0 refills | Status: DC

## 2018-06-10 NOTE — Assessment & Plan Note (Signed)
Patient reports that he has been out of his medication for a couple weeks.  He requests refill of NovoLog and Lantus.  He has a good understanding of how to take his medications.  It is unclear how often he takes his NovoLog as he does not report checking his blood sugar at all throughout the day.  He also reports that he is compliant with his metformin twice daily.  Patient to come in for lab visit tomorrow morning to check A1c and urine microalbumin as patient not on ACE or ARB  Will forward results to Dr. Andy Gauss

## 2018-06-10 NOTE — Progress Notes (Signed)
[facility] Telemedicine Visit  Patient consented to have visit conducted via telephone.  Encounter participants: Patient: Joseph [name]  Provider: Wilber Oliphant, MD  PCP: Marjie Skiff, MD Subjective:  CC: Med Refill   HPI:  Joseph [name] is a 53 y.o. male with past medical history significant for insulin-dependent diabetes who presents for telemedicine visit to refill his medications. Patient reports that he has been out of NovoLog for about 1 week and has been out of his Lantus for about 2 weeks.  He understands how he is supposed to take his medication, reports that he takes Lantus if his blood sugars over 100 in the morning and that he should take 15 to 20 units of NovoLog with each meal.   Patient reports that he checks his sugars in the morning typically and does not check his sugars through the rest of the day, though he does report being compliant with his NovoLog up until recently.  He typically does not check his blood sugar at work as he does not take his insulin to work. Patient reports that he is also taking metformin 1000 mg twice daily.  He does not need any device or equipment refills today.  He has Jardiance listed on his medication list, however patient reports that he is not taking it.  Patient would also like a refill for Eliquis.    Medications & Allergies: Reviewed in chart Pertinent Medical, Family and Social History: DMII, CHF, HLD, hx CVA  Objective:  Pertinent Labs & Imaging:  September 2019 - Hgb A1C 7.1  CMP wnl, CBC wnl, Lipid panel with high TG   Assessment & Plan:   Long-term insulin use in type 2 diabetes [facility]) Patient reports that he has been out of his medication for a couple weeks.  He requests refill of NovoLog and Lantus.  He has a good understanding of how to take his medications.  It is unclear how often he takes his NovoLog as he does not report checking his blood sugar at all throughout the day.  He also reports  that he is compliant with his metformin twice daily.  Patient to come in for lab visit tomorrow morning to check A1c and urine microalbumin as patient not on ACE or ARB  Will forward results to Dr. Andy Gauss   Time spent on phone with patient: 20 minutes Patient charged  Zettie Cooley, M.D. [place]  PGY -1 06/10/2018, 2:52 PM

## 2018-06-11 ENCOUNTER — Other Ambulatory Visit (INDEPENDENT_AMBULATORY_CARE_PROVIDER_SITE_OTHER): Payer: Self-pay

## 2018-06-11 ENCOUNTER — Other Ambulatory Visit: Payer: Self-pay

## 2018-06-11 DIAGNOSIS — Z794 Long term (current) use of insulin: Secondary | ICD-10-CM

## 2018-06-11 DIAGNOSIS — E118 Type 2 diabetes mellitus with unspecified complications: Secondary | ICD-10-CM

## 2018-06-11 LAB — POCT UA - MICROALBUMIN
Creatinine, POC: 50 mg/dL
Microalbumin Ur, POC: 80 mg/L

## 2018-06-11 LAB — POCT GLYCOSYLATED HEMOGLOBIN (HGB A1C): HbA1c, POC (controlled diabetic range): 11.3 % — AB (ref 0.0–7.0)

## 2018-06-15 NOTE — Progress Notes (Signed)
Spoke with pts significant other. Asked her to tell pt to give [place] a call. To set up his follow up appt. Either throught telemed or well visit. pts significant other said she would tell him to give [place] a call. Salvatore Marvel, CMA

## 2018-07-23 ENCOUNTER — Ambulatory Visit (INDEPENDENT_AMBULATORY_CARE_PROVIDER_SITE_OTHER): Payer: Self-pay | Admitting: *Deleted

## 2018-07-23 DIAGNOSIS — I428 Other cardiomyopathies: Secondary | ICD-10-CM

## 2018-07-24 ENCOUNTER — Telehealth: Payer: Self-pay

## 2018-07-24 NOTE — Telephone Encounter (Signed)
Left message for patient to remind of missed remote transmission.  

## 2018-07-29 ENCOUNTER — Ambulatory Visit (INDEPENDENT_AMBULATORY_CARE_PROVIDER_SITE_OTHER): Payer: Self-pay | Admitting: Family Medicine

## 2018-07-29 ENCOUNTER — Encounter: Payer: Self-pay | Admitting: Family Medicine

## 2018-07-29 ENCOUNTER — Other Ambulatory Visit: Payer: Self-pay

## 2018-07-29 DIAGNOSIS — Z794 Long term (current) use of insulin: Secondary | ICD-10-CM

## 2018-07-29 DIAGNOSIS — E119 Type 2 diabetes mellitus without complications: Secondary | ICD-10-CM

## 2018-07-29 LAB — CUP PACEART REMOTE DEVICE CHECK
Battery Remaining Longevity: 138 mo
Battery Remaining Percentage: 100 %
Brady Statistic RV Percent Paced: 0 %
Date Time Interrogation Session: 20200524133100
HighPow Impedance: 78 Ohm
Implantable Lead Implant Date: 20150930
Implantable Lead Location: 753860
Implantable Lead Model: 180
Implantable Lead Serial Number: 311831
Implantable Pulse Generator Implant Date: 20150930
Lead Channel Impedance Value: 410 Ohm
Lead Channel Pacing Threshold Amplitude: 0.9 V
Lead Channel Pacing Threshold Pulse Width: 0.4 ms
Lead Channel Setting Pacing Amplitude: 2.4 V
Lead Channel Setting Pacing Pulse Width: 0.4 ms
Lead Channel Setting Sensing Sensitivity: 0.6 mV
Pulse Gen Serial Number: 191604

## 2018-07-29 MED ORDER — APIXABAN 5 MG PO TABS: 5.0000 mg | ORAL_TABLET | Freq: Two times a day (BID) | ORAL | 0 refills | Status: DC

## 2018-07-29 NOTE — Progress Notes (Signed)
Subjective:    Patient ID: Joseph [name], male    DOB: October 23, 1965, 53 y.o.   MRN: [id number]   CC: Follow up for T2DM  HPI: Patient is a 53 yo male with a past medical history significant for T2DM who present today to follow up on management. Patient reports that he has been unable to afford his medication and has not taken his Lantus in months. His A1c at the beginning of April was 11.3 up from 7.1.Patient has not been able to afford his Eliquis. Patient reports he recently move to a new house which has changed his finances significantly. He used to live with his girlfriend. He is currently working part time and has an orange card.  He denies any chest pain, SOB, abdominal pain, polyuria, polydipsia, nausea or vomiting.  Smoking status reviewed   ROS: all other systems were reviewed and are negative other than in the HPI   Past Medical History:  Diagnosis Date  . Automatic implantable cardioverter-defibrillator in situ 12/01/2013   SINGLE CHAMBER BY DR Olin Pia  . CAD (coronary artery disease)    a. 06/28/13 non obst dz. managed medically  . CHF (congestive heart failure) ([place])   . High cholesterol   . Hypertension   . Non-ischemic cardiomyopathy ([place])    a. 06/28/13 ECHO: EF 25% and global hypokinesis and cath wuth EF 15%   . Pneumonia ?2004  . Shortness of breath   . Stroke ([place])   . Type II diabetes mellitus ([place])     Past Surgical History:  Procedure Laterality Date  . COLONOSCOPY WITH PROPOFOL N/A 07/08/2014   Procedure: COLONOSCOPY WITH PROPOFOL;  Surgeon: Carol Ada, MD;  [facility];  Service: Endoscopy;  Laterality: N/A;  . COLONOSCOPY WITH PROPOFOL N/A 08/22/2017   Procedure: COLONOSCOPY WITH PROPOFOL;  Surgeon: Carol Ada, MD;  [facility] ENDOS[facility];  Service: Endoscopy;  Laterality: N/A;  . EP IMPLANTABLE DEVICE  12/01/2013   single chamber by dr Caryl Comes  . FINGER AMPUTATION Right 1987   "reattached middle and ring fingers"  . IMPLANTABLE CARDIOVERTER  DEFIBRILLATOR IMPLANT N/A 12/01/2013   Procedure: IMPLANTABLE CARDIOVERTER DEFIBRILLATOR IMPLANT;  Surgeon: Deboraha Sprang, MD;  Location: [facility] C[facility] LAB;  Service: Cardiovascular;  Laterality: N/A;  . LEFT HEART CATHETERIZATION WITH CORONARY ANGIOGRAM N/A 06/30/2013   Procedure: LEFT HEART CATHETERIZATION WITH CORONARY ANGIOGRAM;  Surgeon: Troy Sine, MD;  Location[facility] CATH LAB;  Service: Cardiovascular;  Laterality: N/A;  . TEE WITHOUT CARDIOVERSION N/A 07/06/2013   Procedure: TRANSESOPHAGEAL ECHOCARDIOGRAM (TEE);  Surgeon: [name], MD;  Location: [facility] ENDOSCOPY;  Service: Cardiovascular;  Laterality: N/A;    Past medical history, surgical, family, and social history reviewed and updated in the EMR as appropriate.  Objective:  BP 122/78   Pulse 73   SpO2 98%   Vitals and nursing note reviewed  General: NAD, pleasant, able to participate in exam Cardiac: RRR, normal heart sounds, no murmurs. 2+ radial and PT pulses bilaterally Respiratory: CTAB, normal effort, No wheezes, rales or rhonchi Abdomen: soft, nontender, nondistended, no hepatic or splenomegaly, +BS Extremities: Foot exam performed Skin: warm and dry, no rashes noted Neuro: alert and oriented x4, no focal deficits Psych: Normal affect and mood   Assessment & Plan:   Long-term insulin use in type 2 diabetes ([place]) Patient with now uncontrolled T2DM with A1c of 11.5. Provided 2 lantus sample today. Patient still has novolog and will continue to use as prescribed. Patient currently unable to afford  Jardiance. Will discuss with assistance program with our [place] team, unclear if patient is eligible. Currently he has the orange card. --Continue with Lantus 30 units daily --Patient will provide record from ophthalmology --Foot exam was within normal limits, no signs of neuropathy. --Will need to follow up with Cardiology to be restarted on Entresto. No sign or worsening kidney function.  Coupon for 30 day free  supply of Eliquis provided today.   Marjie Skiff, MD [place] PGY-3

## 2018-07-29 NOTE — Patient Instructions (Signed)
It was great seeing you today! We have addressed the following issues today  1. Continue with Lantus 30 units daily and Novolog as instructed 2. Eliquis has been sent to [facility]. 3. I will call our pharmacy team and see if they can help you with your medications.  If we did any lab work today, and the results require attention, either me or my nurse will get in touch with you. If everything is normal, you will get a letter in mail and a message via . If you don't hear from [place] in two weeks, please give [place] a call. Otherwise, we look forward to seeing you again at your next visit. If you have any questions or concerns before then, please call the clinic at [phone].  Please bring all your medications to every doctors visit  Sign up for My Chart to have easy access to your labs results, and communication with your Primary care physician. Please ask Front Desk for some assistance.   Please check-out at the front desk before leaving the clinic.    Take Care,   Dr. Andy Gauss

## 2018-07-29 NOTE — Assessment & Plan Note (Signed)
Patient with now uncontrolled T2DM with A1c of 11.5. Provided 2 lantus sample today. Patient still has novolog and will continue to use as prescribed. Patient currently unable to afford Jardiance. Will discuss with assistance program with our [place] team, unclear if patient is eligible. Currently he has the orange card. --Continue with Lantus 30 units daily --Patient will provide record from ophthalmology --Foot exam was within normal limits, no signs of neuropathy. --Will need to follow up with Cardiology to be restarted on Entresto. No sign or worsening kidney function.

## 2018-07-30 ENCOUNTER — Telehealth: Payer: Self-pay | Admitting: *Deleted

## 2018-07-30 ENCOUNTER — Telehealth: Payer: Self-pay | Admitting: Pharmacist

## 2018-07-30 DIAGNOSIS — E118 Type 2 diabetes mellitus with unspecified complications: Secondary | ICD-10-CM

## 2018-07-30 NOTE — Telephone Encounter (Signed)
Received message from Dr. Andy Gauss regarding this patient and affordability concerns with Lantus, Novolog, Eliquis, and Entresto.   Contacted patient, spoke with his girlfriend, Chelsea Primus. HIPAA verifiers identified. She notes that to her knowledge, Mr. Loflin is not getting his medications from the Health Department, so likely isn't using the [employer].   She will have the patient call me when he gets a chance. Will perform medication review, and likely connect with [facility]/Health Department for completion of assistance paperwork for brand name medications.   Catie Darnelle Maffucci, PharmD, [place] PGY2 Ambulatory Care Pharmacy Resident, [facility] Phone: [phone]

## 2018-07-30 NOTE — Telephone Encounter (Signed)
[place] team is following up with patient about medication affordability.   Thank you   Marjie Skiff, MD [place], PGY-3

## 2018-07-30 NOTE — Telephone Encounter (Signed)
Received call from [facility] outpatient.  The coupon card we gave pt was used by him in 2016.  This is a once in a lifetime card.  Will forward to MD.  Christen Bame, CMA

## 2018-08-03 ENCOUNTER — Encounter: Payer: Self-pay | Admitting: Cardiology

## 2018-08-03 NOTE — Progress Notes (Signed)
Remote ICD transmission.   

## 2018-08-06 NOTE — Telephone Encounter (Signed)
Contacted patient again; left HIPAA compliant message on Melanie Dodd's line for patient to call me back at his convenience.

## 2018-08-07 NOTE — Telephone Encounter (Signed)
Attempted to call Joseph [name] - message was "unable to be completed as dialed - try again later"   Left message for patient RE potential for MAP at [facility].   Attempt contact again on Monday 6/8

## 2018-08-07 NOTE — Telephone Encounter (Signed)
-----   Message from De Hollingshead, [facility] sent at 08/07/2018 10:53 AM EDT ----- Hi. Would you get a chance before Thursday to follow up with this patient about getting meds at MAP, since it sounds like he has an orange card? He called me back last night, said best number is [phone], and I don't think I'll have a chance to get to it today.   Catie

## 2018-08-10 MED ORDER — EMPAGLIFLOZIN 10 MG PO TABS: 10.0000 mg | ORAL_TABLET | Freq: Every day | ORAL | 3 refills | Status: DC

## 2018-08-10 MED ORDER — INSULIN GLARGINE 100 UNIT/ML SOLOSTAR PEN: 30.0000 [IU] | PEN_INJECTOR | Freq: Every day | SUBCUTANEOUS | 3 refills | Status: DC

## 2018-08-10 MED ORDER — INSULIN ASPART 100 UNIT/ML FLEXPEN: PEN_INJECTOR | SUBCUTANEOUS | 3 refills | Status: DC

## 2018-08-10 MED ORDER — APIXABAN 5 MG PO TABS: 5.0000 mg | ORAL_TABLET | Freq: Two times a day (BID) | ORAL | 3 refills | Status: DC

## 2018-08-10 MED ORDER — SACUBITRIL-VALSARTAN 97-103 MG PO TABS: 1.0000 | ORAL_TABLET | Freq: Two times a day (BID) | ORAL | 3 refills | Status: DC

## 2018-08-10 NOTE — Telephone Encounter (Signed)
-----   Message from De Hollingshead, [facility] sent at 08/07/2018 10:53 AM EDT ----- Hi. Would you get a chance before Thursday to follow up with this patient about getting meds at MAP, since it sounds like he has an orange card? He called me back last night, said best number is [phone], and I don't think I'll have a chance to get to it today.   Catie

## 2018-08-10 NOTE — Telephone Encounter (Signed)
Contacted patient, confirmed Joseph [name], confirmed he knew location of Health Dept and explained MAP program process.  He expressed willingness to go to health department pharmacy to fill out MAP applications for;  Lantus 30 units daily Novolog, 10/15 and 20 units prior to meals Apixaban 5mg  BID  Jardiance 10mg   and  Entresto 97/103  New prescriptions for these medications sent to [place] [facility]

## 2018-10-22 ENCOUNTER — Encounter: Payer: Self-pay | Admitting: *Deleted

## 2018-10-26 ENCOUNTER — Ambulatory Visit (INDEPENDENT_AMBULATORY_CARE_PROVIDER_SITE_OTHER): Payer: Self-pay | Admitting: *Deleted

## 2018-10-26 DIAGNOSIS — I428 Other cardiomyopathies: Secondary | ICD-10-CM

## 2018-10-30 ENCOUNTER — Other Ambulatory Visit: Payer: Self-pay

## 2018-11-02 ENCOUNTER — Encounter: Payer: Self-pay | Admitting: Cardiology

## 2018-11-02 LAB — CUP PACEART REMOTE DEVICE CHECK
Date Time Interrogation Session: 20200831214308
Implantable Lead Implant Date: 20150930
Implantable Lead Location: 753860
Implantable Lead Model: 180
Implantable Lead Serial Number: 311831
Implantable Pulse Generator Implant Date: 20150930
Pulse Gen Serial Number: 191604

## 2018-11-02 NOTE — Progress Notes (Signed)
Remote ICD transmission.   

## 2019-01-25 ENCOUNTER — Ambulatory Visit (INDEPENDENT_AMBULATORY_CARE_PROVIDER_SITE_OTHER): Payer: Self-pay | Admitting: *Deleted

## 2019-01-25 DIAGNOSIS — Z95810 Presence of automatic (implantable) cardiac defibrillator: Secondary | ICD-10-CM

## 2019-01-31 LAB — CUP PACEART REMOTE DEVICE CHECK
Battery Remaining Longevity: 132 mo
Battery Remaining Percentage: 100 %
Brady Statistic RV Percent Paced: 0 %
Date Time Interrogation Session: 20201127205400
HighPow Impedance: 74 Ohm
Implantable Lead Implant Date: 20150930
Implantable Lead Location: 753860
Implantable Lead Model: 180
Implantable Lead Serial Number: 311831
Implantable Pulse Generator Implant Date: 20150930
Lead Channel Impedance Value: 395 Ohm
Lead Channel Pacing Threshold Amplitude: 0.9 V
Lead Channel Pacing Threshold Pulse Width: 0.4 ms
Lead Channel Setting Pacing Amplitude: 2.4 V
Lead Channel Setting Pacing Pulse Width: 0.4 ms
Lead Channel Setting Sensing Sensitivity: 0.6 mV
Pulse Gen Serial Number: 191604

## 2019-02-21 NOTE — Addendum Note (Signed)
Addended by: Patsey Berthold on: 02/21/2019 08:12 PM   Modules accepted: Level of Service

## 2019-04-26 ENCOUNTER — Ambulatory Visit (INDEPENDENT_AMBULATORY_CARE_PROVIDER_SITE_OTHER): Payer: Self-pay | Admitting: *Deleted

## 2019-04-26 DIAGNOSIS — Z95810 Presence of automatic (implantable) cardiac defibrillator: Secondary | ICD-10-CM

## 2019-04-26 LAB — CUP PACEART REMOTE DEVICE CHECK
Battery Remaining Longevity: 120 mo
Battery Remaining Percentage: 100 %
Brady Statistic RV Percent Paced: 0 %
Date Time Interrogation Session: 20210222110400
HighPow Impedance: 72 Ohm
Implantable Lead Implant Date: 20150930
Implantable Lead Location: 753860
Implantable Lead Model: 180
Implantable Lead Serial Number: 311831
Implantable Pulse Generator Implant Date: 20150930
Lead Channel Impedance Value: 428 Ohm
Lead Channel Pacing Threshold Amplitude: 0.9 V
Lead Channel Pacing Threshold Pulse Width: 0.4 ms
Lead Channel Setting Pacing Amplitude: 2.4 V
Lead Channel Setting Pacing Pulse Width: 0.4 ms
Lead Channel Setting Sensing Sensitivity: 0.6 mV
Pulse Gen Serial Number: 191604

## 2019-04-27 NOTE — Progress Notes (Signed)
ICD Remote  

## 2019-04-29 ENCOUNTER — Telehealth: Payer: Self-pay | Admitting: *Deleted

## 2019-04-29 NOTE — Telephone Encounter (Signed)
LVM to call office to go over screening questions prior to visit.Joseph [name], CMA  

## 2019-04-29 NOTE — Progress Notes (Addendum)
SUBJECTIVE:   CHIEF COMPLAINT / HPI:   Chest pain He reports that he has had one episode of chest pain this past week which is the main reason for his visit.  He inconsistently takes his medication and has been off of much of his medication for about 1 year.  His chest pain this past week consisted of a bandlike tightness around the center of his chest that he noticed while he was washing dishes on Wednesday.  He did not experience any pain in his arm, back, jaw at this time.  He did not take any medications to help alleviate the pain.  Ultimately he went to lie down on the couch for about 30 minutes and the sensation went away.  This sensation has not recurred.  NICM Is previously been seen by cardiology here at [facility] for his nonischemic cardiomyopathy.  He is not seen a cardiologist in over a year.  His current medications include Entresto, Coreg, Lasix, spironolactone.  He is not taken his Entresto for at least 1 year and is inconsistent about his last use of Coreg.  He reports he has not been taking these medications for financial reasons.  His financial situation is improved because he is able to acquire stable health insurance and can now afford medication.  Diabetes A1c 11.3 06/2019.  A1c 11.1 today. Metformin 1000 BID lantus 30 daily aspart 15 empagliflozin  OSA He does carry diagnosis of obstructive sleep apnea and does have a functional CPAP which he uses at night.   PERTINENT  PMH / PSH: Nonischemic cardiomyopathy, systolic heart failure, hypertension, stroke, OSA, current smoker  OBJECTIVE:   BP (!) 148/92   Pulse (!) 120   Wt 181 lb (82.1 kg)   SpO2 100%   BMI 30.12 kg/m    General: Alert and cooperative and appears to be in no acute distress HEENT: no JVD Cardio: Normal S1 and S2, no S3 or S4. Rhythm is regular. No murmurs or rubs.   Pulm: Clear to auscultation bilaterally, no crackles, wheezing, or diminished breath sounds. Normal respiratory effort Abdomen:  Bowel sounds normal. Abdomen soft and non-tender.  Extremities: No peripheral edema. Warm/ well perfused.  Strong radial pulse. Neuro: Cranial nerves grossly intact   ASSESSMENT/PLAN:   Paroxysmal atrial fibrillation [facility]) Mr. Nicolich is not aware why he was initially prescribed Eliquis.  Chart review shows that he was found to have paroxysmal A. fib in 2016 and was started on Eliquis at that time.  He has not taken his Eliquis over 1 year due to financial difficulties.  He has no established health insurance company has better ability to afford his medication. -Restart Eliquis  HTN (hypertension) Poorly controlled.  Not currently taking medications. -Restart medications -Follow-up in 1-2 weeks  HFrEF (heart failure with reduced ejection fraction) No evidence of volume overload despite being off most of his medications for about 1 year.  The importance of routine care and clinic visits with his condition was emphasized.  He was encouraged to follow-up with cardiology.  His medication was refilled and he was encouraged to restart his medication is slowly leaving a day or 2 between each new medication to avoid hypotension or bradycardia. -Refilled Entresto -Refilled carvedilol -Continue Lasix -Continue spironolactone -Encouraged to follow-up with cardiology in the next month -Follow-up BMP, CBC, lipid panel  Long-term insulin use in type 2 diabetes ([place]) Stable.  No evidence of DKA or HHS.  We did not have time to fully address today. -Follow-up in  clinic in 1 to 2 weeks for further discussion regarding diabetes  Smoking No interest in smoking cessation today. -Encouraged to follow-up in clinic he becomes interested in smoking cessation  UTI (urinary tract infection) -Keflex 500 mg twice daily for 7 days -Follow-up UA to assess blood in urine     Matilde Haymaker, MD [place]

## 2019-04-30 ENCOUNTER — Other Ambulatory Visit: Payer: Self-pay

## 2019-04-30 ENCOUNTER — Encounter: Payer: Self-pay | Admitting: Family Medicine

## 2019-04-30 ENCOUNTER — Ambulatory Visit (INDEPENDENT_AMBULATORY_CARE_PROVIDER_SITE_OTHER): Payer: 59 | Admitting: Family Medicine

## 2019-04-30 VITALS — BP 148/92 | HR 120 | Wt 181.0 lb

## 2019-04-30 DIAGNOSIS — E119 Type 2 diabetes mellitus without complications: Secondary | ICD-10-CM

## 2019-04-30 DIAGNOSIS — I480 Paroxysmal atrial fibrillation: Secondary | ICD-10-CM

## 2019-04-30 DIAGNOSIS — I5020 Unspecified systolic (congestive) heart failure: Secondary | ICD-10-CM | POA: Diagnosis not present

## 2019-04-30 DIAGNOSIS — Z23 Encounter for immunization: Secondary | ICD-10-CM | POA: Diagnosis not present

## 2019-04-30 DIAGNOSIS — N390 Urinary tract infection, site not specified: Secondary | ICD-10-CM | POA: Insufficient documentation

## 2019-04-30 DIAGNOSIS — Z794 Long term (current) use of insulin: Secondary | ICD-10-CM | POA: Diagnosis not present

## 2019-04-30 DIAGNOSIS — I10 Essential (primary) hypertension: Secondary | ICD-10-CM

## 2019-04-30 DIAGNOSIS — E118 Type 2 diabetes mellitus with unspecified complications: Secondary | ICD-10-CM | POA: Diagnosis not present

## 2019-04-30 DIAGNOSIS — N3001 Acute cystitis with hematuria: Secondary | ICD-10-CM

## 2019-04-30 DIAGNOSIS — F17200 Nicotine dependence, unspecified, uncomplicated: Secondary | ICD-10-CM

## 2019-04-30 HISTORY — DX: Urinary tract infection, site not specified: N39.0

## 2019-04-30 LAB — POCT URINALYSIS DIP (MANUAL ENTRY)
Bilirubin, UA: NEGATIVE
Glucose, UA: 500 mg/dL — AB
Ketones, POC UA: NEGATIVE mg/dL
Nitrite, UA: NEGATIVE
Protein Ur, POC: 100 mg/dL — AB
Spec Grav, UA: 1.015 (ref 1.010–1.025)
Urobilinogen, UA: 0.2 E.U./dL
pH, UA: 5.5 (ref 5.0–8.0)

## 2019-04-30 LAB — POCT UA - MICROSCOPIC ONLY: Trichomonas, UA: POSITIVE

## 2019-04-30 LAB — POCT GLYCOSYLATED HEMOGLOBIN (HGB A1C): HbA1c, POC (controlled diabetic range): 11 % — AB (ref 0.0–7.0)

## 2019-04-30 MED ORDER — APIXABAN 5 MG PO TABS: 5.0000 mg | ORAL_TABLET | Freq: Two times a day (BID) | ORAL | 3 refills | Status: DC

## 2019-04-30 MED ORDER — ENTRESTO 97-103 MG PO TABS: 1.0000 | ORAL_TABLET | Freq: Two times a day (BID) | ORAL | 3 refills | Status: DC

## 2019-04-30 MED ORDER — CEPHALEXIN 500 MG PO CAPS: 500.0000 mg | ORAL_CAPSULE | Freq: Two times a day (BID) | ORAL | 0 refills | Status: DC

## 2019-04-30 MED ORDER — CARVEDILOL 25 MG PO TABS: 25.0000 mg | ORAL_TABLET | Freq: Two times a day (BID) | ORAL | 3 refills | Status: DC

## 2019-04-30 MED ORDER — JARDIANCE 10 MG PO TABS: 10.0000 mg | ORAL_TABLET | Freq: Every day | ORAL | 3 refills | Status: AC

## 2019-04-30 MED ORDER — ATORVASTATIN CALCIUM 80 MG PO TABS: 80.0000 mg | ORAL_TABLET | Freq: Every evening | ORAL | 3 refills | Status: DC

## 2019-04-30 MED ORDER — METFORMIN HCL 1000 MG PO TABS: 1000.0000 mg | ORAL_TABLET | Freq: Two times a day (BID) | ORAL | 2 refills | Status: DC

## 2019-04-30 NOTE — Addendum Note (Signed)
Addended by: Grant Ruts on: 04/30/2019 06:51 PM   Modules accepted: Orders

## 2019-04-30 NOTE — Assessment & Plan Note (Addendum)
Poorly controlled.  Not currently taking medications. -Restart medications -Follow-up in 1-2 weeks

## 2019-04-30 NOTE — Assessment & Plan Note (Signed)
No interest in smoking cessation today. -Encouraged to follow-up in clinic he becomes interested in smoking cessation

## 2019-04-30 NOTE — Patient Instructions (Signed)
Urinary tract infection: Routine treat you with a 7-day course of Keflex.  He needs to take this antibiotic 2 times daily for the next week.  Diabetes: We did not have time to fully address her diabetes this visit.  Please come back in the next month for a longer discussion about what we can do to improve your blood sugar management.  Heart failure: I have refilled your heart failure medication.  I think it is incredibly important for you to follow-up with your cardiologist.  It is important to make sure that you are getting the best treatment for this problem because it can be very serious if left untreated.  Chest pain: We will get some routine labs today without think there is more to do for this at the moment.  Please give [place] a call or see your cardiologist if this chest pain returns.  Blood thinners: I am look through your chart and call you tonight to let you know about your blood thinner and whether or not you need to continue taking it.

## 2019-04-30 NOTE — Assessment & Plan Note (Addendum)
No evidence of volume overload despite being off most of his medications for about 1 year.  The importance of routine care and clinic visits with his condition was emphasized.  He was encouraged to follow-up with cardiology.  His medication was refilled and he was encouraged to restart his medication is slowly leaving a day or 2 between each new medication to avoid hypotension or bradycardia. -Refilled Entresto -Refilled carvedilol -Continue Lasix -Continue spironolactone -Encouraged to follow-up with cardiology in the next month -Follow-up BMP, CBC, lipid panel

## 2019-04-30 NOTE — Assessment & Plan Note (Signed)
Stable.  No evidence of DKA or HHS.  We did not have time to fully address today. -Follow-up in clinic in 1 to 2 weeks for further discussion regarding diabetes

## 2019-04-30 NOTE — Assessment & Plan Note (Addendum)
-  Keflex 500 mg twice daily for 7 days -Follow-up UA to assess blood in urine

## 2019-04-30 NOTE — Progress Notes (Signed)
Joseph [name] was called this evening and informed that he is taking Eliquis due to his paroxysmal atrial fibrillation.  This medication helps prevent strokes.  He was told that it is very important for him to start taking this medication again as soon as he can.  He reported that he would pick it up tomorrow when he returned to the pharmacy to pick up his antibiotics.  He was again reminded to return to clinic in the next 1 to 2 weeks.

## 2019-04-30 NOTE — Assessment & Plan Note (Signed)
Joseph [name] is not aware why he was initially prescribed Eliquis.  Chart review shows that he was found to have paroxysmal A. fib in 2016 and was started on Eliquis at that time.  He has not taken his Eliquis over 1 year due to financial difficulties.  He has no established health insurance company has better ability to afford his medication. -Restart Eliquis

## 2019-05-01 LAB — CBC
Hematocrit: 48.8 % (ref 37.5–51.0)
Hemoglobin: 17.3 g/dL (ref 13.0–17.7)
MCH: 34.6 pg — ABNORMAL HIGH (ref 26.6–33.0)
MCHC: 35.5 g/dL (ref 31.5–35.7)
MCV: 98 fL — ABNORMAL HIGH (ref 79–97)
Platelets: 195 x10E3/uL (ref 150–450)
RBC: 5 x10E6/uL (ref 4.14–5.80)
RDW: 12 % (ref 11.6–15.4)
WBC: 8 x10E3/uL (ref 3.4–10.8)

## 2019-05-01 LAB — LIPID PANEL
Chol/HDL Ratio: 4.3 ratio (ref 0.0–5.0)
Cholesterol, Total: 348 mg/dL — ABNORMAL HIGH (ref 100–199)
HDL: 81 mg/dL (ref >39)
LDL Chol Calc (NIH): 230 mg/dL — ABNORMAL HIGH (ref 0–99)
Triglycerides: 192 mg/dL — ABNORMAL HIGH (ref 0–149)
VLDL Cholesterol Cal: 37 mg/dL (ref 5–40)

## 2019-05-01 LAB — BASIC METABOLIC PANEL WITH GFR
BUN/Creatinine Ratio: 13 (ref 9–20)
BUN: 14 mg/dL (ref 6–24)
CO2: 20 mmol/L (ref 20–29)
Calcium: 9.7 mg/dL (ref 8.7–10.2)
Chloride: 100 mmol/L (ref 96–106)
Creatinine, Ser: 1.08 mg/dL (ref 0.76–1.27)
GFR calc Af Amer: 89 mL/min/1.73
GFR calc non Af Amer: 77 mL/min/1.73
Glucose: 299 mg/dL — ABNORMAL HIGH (ref 65–99)
Potassium: 4.6 mmol/L (ref 3.5–5.2)
Sodium: 137 mmol/L (ref 134–144)

## 2019-05-20 NOTE — Progress Notes (Signed)
    SUBJECTIVE:   CHIEF COMPLAINT / HPI:   Diabetes His current medications include: -Metformin one thousand twice daily -Lantus 30 units daily -Aspart 15 units morning and night -Empagliflozin 10 mg daily His recent A1c shows significant worsening to 11.0 at last measurement.  He reports that for multiple months this past year, he would not take any of his medication.  UTI At his last visit, he was diagnosed with a UTI and symptoms of burning with urination.  He has completed a course of Keflex and had significant improvement in his symptoms she no longer has any urinary symptoms.  His girlfriend encouraged him to inquire about STI testing.  To his knowledge, his girlfriend has not been diagnosed with any STIs.  He has no complaints of penile pain or discharge or burning with urination at this time.  PERTINENT  PMH / PSH: Type 2 diabetes, poorly controlled  OBJECTIVE:   BP 114/66   Pulse 97   Ht 5\' 5"  (1.651 m)   Wt 187 lb 2 oz (84.9 kg)   SpO2 97%   BMI 31.14 kg/m    General: Alert and cooperative and appears to be in no acute distress HEENT: Neck non-tender without lymphadenopathy, masses or thyromegaly Cardio: Normal S1 and S2, no S3 or S4. Rhythm is regular. No murmurs or rubs.   Pulm: Clear to auscultation bilaterally, no crackles, wheezing, or diminished breath sounds. Normal respiratory effort. Extremities: No peripheral edema. Warm/ well perfused.  Strong radial pulse. Neuro: Cranial nerves grossly intact   ASSESSMENT/PLAN:   Paroxysmal atrial fibrillation ([facility]) Regular rate and rhythm today.  He reports that he is currently taking his Eliquis.  Long-term insulin use in type 2 diabetes ([place]) Increased A1c likely secondary to poor compliance with prescribed medication.  He was encouraged to attempt to adhere closely to his current regimen and we will recheck his A1c in 3 months. -Continue current medication -Urine microalbumin ordered today.  No urine sample  provided.  Plans to return for a lab visit for a urine sample at a later date.  Routine screening for STI (sexually transmitted infection) No symptoms currently.  Previous dysuria is more likely related to his previously diagnosed urinary tract infection.  He was encouraged by the urine sample for assessment for gonorrhea/chlamydia today.  He was not able to provide a urine sample today and left with plans to return for a lab visit.     Matilde Haymaker, MD [place]

## 2019-05-21 ENCOUNTER — Encounter: Payer: Self-pay | Admitting: Family Medicine

## 2019-05-21 ENCOUNTER — Ambulatory Visit: Payer: 59 | Admitting: Family Medicine

## 2019-05-21 ENCOUNTER — Other Ambulatory Visit: Payer: Self-pay

## 2019-05-21 VITALS — BP 114/66 | HR 97 | Ht 65.0 in | Wt 187.1 lb

## 2019-05-21 DIAGNOSIS — Z113 Encounter for screening for infections with a predominantly sexual mode of transmission: Secondary | ICD-10-CM

## 2019-05-21 DIAGNOSIS — I480 Paroxysmal atrial fibrillation: Secondary | ICD-10-CM | POA: Diagnosis not present

## 2019-05-21 DIAGNOSIS — Z794 Long term (current) use of insulin: Secondary | ICD-10-CM | POA: Diagnosis not present

## 2019-05-21 DIAGNOSIS — E119 Type 2 diabetes mellitus without complications: Secondary | ICD-10-CM | POA: Diagnosis not present

## 2019-05-21 NOTE — Assessment & Plan Note (Signed)
Regular rate and rhythm today.  He reports that he is currently taking his Eliquis.

## 2019-05-21 NOTE — Assessment & Plan Note (Addendum)
Increased A1c likely secondary to poor compliance with prescribed medication.  He was encouraged to attempt to adhere closely to his current regimen and we will recheck his A1c in 3 months. -Continue current medication -Urine microalbumin ordered today.  No urine sample provided.  Plans to return for a lab visit for a urine sample at a later date.

## 2019-05-21 NOTE — Patient Instructions (Signed)
Thanks for coming in talk more about your diabetes today.  It sounds like we should give you a little bit of time to take your medicines consistently and then recheck your numbers.  Lets plan to follow-up in 3 months and will take another look at your diabetes numbers to make sure they are getting better.  I will let you know the results of the labs that were doing on your urine today.  If they are all normal I will send you a letter.

## 2019-05-21 NOTE — Assessment & Plan Note (Signed)
No symptoms currently.  Previous dysuria is more likely related to his previously diagnosed urinary tract infection.  He was encouraged by the urine sample for assessment for gonorrhea/chlamydia today.  He was not able to provide a urine sample today and left with plans to return for a lab visit.

## 2019-05-24 ENCOUNTER — Other Ambulatory Visit: Payer: 59

## 2019-05-24 ENCOUNTER — Encounter: Payer: Self-pay | Admitting: Family Medicine

## 2019-05-24 ENCOUNTER — Other Ambulatory Visit: Payer: Self-pay

## 2019-05-24 ENCOUNTER — Ambulatory Visit: Payer: 59

## 2019-05-24 ENCOUNTER — Other Ambulatory Visit (HOSPITAL_COMMUNITY)
Admission: RE | Admit: 2019-05-24 | Discharge: 2019-05-24 | Disposition: A | Discharge status: Home / Self Care | Payer: 59 | Source: Ambulatory Visit | Attending: Family Medicine | Admitting: Family Medicine

## 2019-05-24 DIAGNOSIS — Z113 Encounter for screening for infections with a predominantly sexual mode of transmission: Secondary | ICD-10-CM | POA: Insufficient documentation

## 2019-05-24 LAB — POCT UA - MICROALBUMIN
Creatinine, POC: 100 mg/dL
Microalbumin Ur, POC: 80 mg/L

## 2019-05-24 NOTE — Addendum Note (Signed)
Addended by: Londell Moh T on: 05/24/2019 04:34 PM   Modules accepted: Orders

## 2019-05-26 LAB — URINE CYTOLOGY ANCILLARY ONLY
Chlamydia: NEGATIVE
Comment: NEGATIVE
Comment: NORMAL
Neisseria Gonorrhea: NEGATIVE

## 2019-07-27 ENCOUNTER — Telehealth: Payer: Self-pay

## 2019-07-27 NOTE — Telephone Encounter (Signed)
Spoke with patient to remind of missed remote transmission 

## 2019-11-15 ENCOUNTER — Ambulatory Visit (INDEPENDENT_AMBULATORY_CARE_PROVIDER_SITE_OTHER): Payer: 59 | Admitting: *Deleted

## 2019-11-15 DIAGNOSIS — I480 Paroxysmal atrial fibrillation: Secondary | ICD-10-CM

## 2019-11-17 LAB — CUP PACEART REMOTE DEVICE CHECK
Battery Remaining Longevity: 120 mo
Battery Remaining Percentage: 100 %
Brady Statistic RV Percent Paced: 0 %
Date Time Interrogation Session: 20210911095800
HighPow Impedance: 81 Ohm
Implantable Lead Implant Date: 20150930
Implantable Lead Location: 753860
Implantable Lead Model: 180
Implantable Lead Serial Number: 311831
Implantable Pulse Generator Implant Date: 20150930
Lead Channel Impedance Value: 440 Ohm
Lead Channel Pacing Threshold Amplitude: 0.9 V
Lead Channel Pacing Threshold Pulse Width: 0.4 ms
Lead Channel Setting Pacing Amplitude: 2.4 V
Lead Channel Setting Pacing Pulse Width: 0.4 ms
Lead Channel Setting Sensing Sensitivity: 0.6 mV
Pulse Gen Serial Number: 191604

## 2019-11-17 NOTE — Progress Notes (Signed)
Remote ICD transmission.   

## 2020-05-16 ENCOUNTER — Telehealth: Payer: Self-pay | Admitting: Cardiology

## 2020-05-16 NOTE — Telephone Encounter (Signed)
Pt called in and stated someone called him today for him to turn on his monitor.  He stated he turned it on but all he see is yellow light blinking .  He would like to know what he needs to do to fix it    Best number [phone]

## 2020-05-18 NOTE — Telephone Encounter (Signed)
LMOVM for pt to give me a call.

## 2020-05-25 NOTE — Telephone Encounter (Signed)
The pt states he was at work. I gave him the number to [employer] tech support to get help with his monitor on his available time. The patient thanked me for the call.

## 2020-06-17 ENCOUNTER — Emergency Department (HOSPITAL_COMMUNITY)
Admission: EM | Admit: 2020-06-17 | Discharge: 2020-06-18 | Disposition: A | Discharge status: Home / Self Care | Payer: 59 | Attending: Emergency Medicine | Admitting: Emergency Medicine

## 2020-06-17 ENCOUNTER — Other Ambulatory Visit: Payer: Self-pay

## 2020-06-17 ENCOUNTER — Emergency Department (HOSPITAL_COMMUNITY): Payer: 59

## 2020-06-17 ENCOUNTER — Encounter (HOSPITAL_COMMUNITY): Payer: Self-pay

## 2020-06-17 DIAGNOSIS — R600 Localized edema: Secondary | ICD-10-CM | POA: Diagnosis not present

## 2020-06-17 DIAGNOSIS — Z7984 Long term (current) use of oral hypoglycemic drugs: Secondary | ICD-10-CM | POA: Insufficient documentation

## 2020-06-17 DIAGNOSIS — I509 Heart failure, unspecified: Secondary | ICD-10-CM

## 2020-06-17 DIAGNOSIS — E119 Type 2 diabetes mellitus without complications: Secondary | ICD-10-CM | POA: Diagnosis not present

## 2020-06-17 DIAGNOSIS — Z7901 Long term (current) use of anticoagulants: Secondary | ICD-10-CM | POA: Insufficient documentation

## 2020-06-17 DIAGNOSIS — Z79899 Other long term (current) drug therapy: Secondary | ICD-10-CM | POA: Diagnosis not present

## 2020-06-17 DIAGNOSIS — I110 Hypertensive heart disease with heart failure: Secondary | ICD-10-CM | POA: Diagnosis not present

## 2020-06-17 DIAGNOSIS — I2510 Atherosclerotic heart disease of native coronary artery without angina pectoris: Secondary | ICD-10-CM | POA: Diagnosis not present

## 2020-06-17 DIAGNOSIS — R0602 Shortness of breath: Secondary | ICD-10-CM | POA: Diagnosis present

## 2020-06-17 DIAGNOSIS — Z794 Long term (current) use of insulin: Secondary | ICD-10-CM | POA: Diagnosis not present

## 2020-06-17 DIAGNOSIS — Z87891 Personal history of nicotine dependence: Secondary | ICD-10-CM | POA: Insufficient documentation

## 2020-06-17 MED ORDER — ALBUTEROL SULFATE HFA 108 (90 BASE) MCG/ACT IN AERS: 2.0000 | INHALATION_SPRAY | RESPIRATORY_TRACT | Status: DC | PRN

## 2020-06-17 NOTE — ED Triage Notes (Signed)
Patient arrived stating he has felt short of breath over the last week that worsens when he lays down and bilateral ankle swelling.

## 2020-06-17 NOTE — ED Notes (Signed)
Patient reports he stopped taking his lasix 6 months ago.

## 2020-06-18 LAB — CBC WITH DIFFERENTIAL/PLATELET
Abs Immature Granulocytes: 0.05 10*3/uL (ref 0.00–0.07)
Basophils Absolute: 0.1 10*3/uL (ref 0.0–0.1)
Basophils Relative: 1 %
Eosinophils Absolute: 0.5 10*3/uL (ref 0.0–0.5)
Eosinophils Relative: 7 %
HCT: 42.8 % (ref 39.0–52.0)
Hemoglobin: 14.2 g/dL (ref 13.0–17.0)
Immature Granulocytes: 1 %
Lymphocytes Relative: 42 %
Lymphs Abs: 3.5 10*3/uL (ref 0.7–4.0)
MCH: 32.7 pg (ref 26.0–34.0)
MCHC: 33.2 g/dL (ref 30.0–36.0)
MCV: 98.6 fL (ref 80.0–100.0)
Monocytes Absolute: 0.9 10*3/uL (ref 0.1–1.0)
Monocytes Relative: 11 %
Neutro Abs: 3.1 10*3/uL (ref 1.7–7.7)
Neutrophils Relative %: 38 %
Platelets: 212 10*3/uL (ref 150–400)
RBC: 4.34 MIL/uL (ref 4.22–5.81)
RDW: 13.5 % (ref 11.5–15.5)
WBC: 8.1 10*3/uL (ref 4.0–10.5)
nRBC: 0 % (ref 0.0–0.2)

## 2020-06-18 LAB — BASIC METABOLIC PANEL
Anion gap: 10 (ref 5–15)
BUN: 20 mg/dL (ref 6–20)
CO2: 17 mmol/L — ABNORMAL LOW (ref 22–32)
Calcium: 8.4 mg/dL — ABNORMAL LOW (ref 8.9–10.3)
Chloride: 105 mmol/L (ref 98–111)
Creatinine, Ser: 1.28 mg/dL — ABNORMAL HIGH (ref 0.61–1.24)
GFR, Estimated: >60 mL/min (ref >60)
Glucose, Bld: 193 mg/dL — ABNORMAL HIGH (ref 70–99)
Potassium: 3.9 mmol/L (ref 3.5–5.1)
Sodium: 132 mmol/L — ABNORMAL LOW (ref 135–145)

## 2020-06-18 LAB — TROPONIN I (HIGH SENSITIVITY)
Troponin I (High Sensitivity): 8 ng/L (ref <18)
Troponin I (High Sensitivity): 8 ng/L (ref <18)

## 2020-06-18 LAB — BRAIN NATRIURETIC PEPTIDE: B Natriuretic Peptide: 1204.5 pg/mL — ABNORMAL HIGH (ref 0.0–100.0)

## 2020-06-18 MED ORDER — FUROSEMIDE 10 MG/ML IJ SOLN
40.0000 mg | INTRAMUSCULAR | Status: AC
  Administered 2020-06-18: 40 mg via INTRAVENOUS
  Filled 2020-06-18: qty 4

## 2020-06-18 MED ORDER — FUROSEMIDE 40 MG PO TABS: 20.0000 mg | ORAL_TABLET | Freq: Every day | ORAL | 0 refills | Status: DC

## 2020-06-18 NOTE — ED Provider Notes (Signed)
[facility] DEPT Provider Note   CSN: [id number] Arrival date & time: 06/17/20  2238     History Chief Complaint  Patient presents with  . Shortness of Breath    Joseph [name] is a 55 y.o. male.  Patient presents to the emergency department with a chief complaint of shortness of breath.  He has history of CHF.  He reports increased shortness of breath and leg swelling that has worsened over the past week or so.  He states that he stopped taking his Lasix approximately 6 months ago.  He states that he does not have any more.  He reports mild chest tightness, but denies chest pain.  Denies any fevers or chills, but does report slight nonproductive cough.  The history is provided by the patient. No language interpreter was used.       Past Medical History:  Diagnosis Date  . Automatic implantable cardioverter-defibrillator in situ 12/01/2013   SINGLE CHAMBER BY DR Olin Pia  . CAD (coronary artery disease)    a. 06/28/13 non obst dz. managed medically  . CHF (congestive heart failure) ([place])   . High cholesterol   . Hypertension   . Non-ischemic cardiomyopathy ([place])    a. 06/28/13 ECHO: EF 25% and global hypokinesis and cath wuth EF 15%   . Pneumonia ?2004  . Shortness of breath   . Stroke ([place])   . Type II diabetes mellitus [facility])     Patient Active Problem List   Diagnosis Date Noted  . Routine screening for STI (sexually transmitted infection) 05/21/2019  . Paroxysmal atrial fibrillation ([place]) 04/30/2019  . UTI (urinary tract infection) 04/30/2019  . Generalized rash 01/07/2017  . ICD (implantable cardioverter-defibrillator), single, in situ 07/04/2016  . OSA on CPAP 08/22/2014  . Snoring 02/17/2014  . Other primary cardiomyopathies 12/01/2013  . Chronic systolic heart failure ([place]) 07/21/2013  . Polysubstance abuse ([place]) 07/21/2013  . Smoking 07/21/2013  . Long-term insulin use in type 2 diabetes ([place]) 07/07/2013  . HTN (hypertension)  07/07/2013  . Stroke ([place]) 07/04/2013  . HFrEF (heart failure with reduced ejection fraction) ([place]) 07/02/2013    Class: Diagnosis of    Past Surgical History:  Procedure Laterality Date  . COLONOSCOPY WITH PROPOFOL N/A 07/08/2014   Procedure: COLONOSCOPY WITH PROPOFOL;  Surgeon: Carol Ada, MD;  Location: [facility] ENDOSCOPY;  Service: Endoscopy;  Laterality: N/A;  . COLONOSCOPY WITH PROPOFOL N/A 08/22/2017   Procedure: COLONOSCOPY WITH PROPOFOL;  Surgeon: Carol Ada, MD;  Location: [facility] ENDOSCOPY;  Service: Endoscopy;  Laterality: N/A;  . EP IMPLANTABLE DEVICE  12/01/2013   single chamber by dr Caryl Comes  . FINGER AMPUTATION Right 1987   "reattached middle and ring fingers"  . IMPLANTABLE CARDIOVERTER DEFIBRILLATOR IMPLANT N/A 12/01/2013   Procedure: IMPLANTABLE CARDIOVERTER DEFIBRILLATOR IMPLANT;  Surgeon: Deboraha Sprang, MD;  Location: [facility] CATH LAB;  Service: Cardiovascular;  Laterality: N/A;  . LEFT HEART CATHETERIZATION WITH CORONARY ANGIOGRAM N/A 06/30/2013   Procedure: LEFT HEART CATHETERIZATION WITH CORONARY ANGIOGRAM;  Surgeon: Troy Sine, MD;  Location: [facility] CATH LAB;  Service: Cardiovascular;  Laterality: N/A;  . TEE WITHOUT CARDIOVERSION N/A 07/06/2013   Procedure: TRANSESOPHAGEAL ECHOCARDIOGRAM (TEE);  Surgeon: [name], MD;  Location: [facility] ENDOSCOPY;  Service: Cardiovascular;  Laterality: N/A;       No family history on file.  Social History   Tobacco Use  . Smoking status: Former Smoker    Packs/day: 0.50    Years: 31.00    Pack  years: 15.50    Types: Cigarettes    Quit date: 12/24/2016    Years since quitting: 3.4  . Smokeless tobacco: Never Used  . Tobacco comment: Quit for 1 month in past - previous 1 ppd   Vaping Use  . Vaping Use: Never used  Substance Use Topics  . Alcohol use: Yes    Alcohol/week: 32.0 standard drinks    Types: 21 Cans of beer, 11 Shots of liquor per week    Comment: 06/29/2013 "2-3 beers/day; maybe 1 pint/wk"  . Drug use: Yes    Types:  Cocaine    Comment: 06/29/2013 "last used cocaine ~ 06/25/2013; use it ~ once/month"    Home Medications Prior to Admission medications   Medication Sig Start Date End Date Taking? Authorizing Provider  apixaban (ELIQUIS) 5 MG TABS tablet Take 1 tablet (5 mg total) by mouth 2 (two) times daily. 04/30/19   Matilde Haymaker, MD  atorvastatin (LIPITOR) 80 MG tablet Take 1 tablet (80 mg total) by mouth every evening. 04/30/19   Matilde Haymaker, MD  carvedilol (COREG) 25 MG tablet Take 1 tablet (25 mg total) by mouth 2 (two) times daily with a meal. 04/30/19   Matilde Haymaker, MD  cephALEXin (KEFLEX) 500 MG capsule Take 1 capsule (500 mg total) by mouth 2 (two) times daily. 04/30/19   Matilde Haymaker, MD  empagliflozin (JARDIANCE) 10 MG TABS tablet Take 10 mg by mouth daily. 04/30/19   Matilde Haymaker, MD  furosemide (LASIX) 40 MG tablet Take 0.5 tablets (20 mg total) by mouth daily. 02/14/17   Larey Dresser, MD  gabapentin (NEURONTIN) 300 MG capsule TAKE ONE CAPSULE BY MOUTH 3 TIMES A DAY Patient taking differently: Take 300 mg by mouth 3 (three) times daily.  11/18/17   Diallo, Earna Coder, MD  glucose blood (ACCU-CHEK AVIVA) test strip Use as instructed 06/17/17   Diallo, Abdoulaye, MD  insulin aspart (NOVOLOG FLEXPEN) 100 UNIT/ML FlexPen INJECT 15-20 UNITS SUBCUTANEOUSLY 3 TIMES A DAY WITH MEALS. TAKE 15 UNITS IF BG IS 80-199. TAKE 20 UNITS IF BG IS &gt;200. 08/10/18   Zenia Resides, MD  Insulin Glargine (LANTUS SOLOSTAR) 100 UNIT/ML Solostar Pen Inject 30 Units into the skin daily for 30 days. 08/10/18 09/09/18  Zenia Resides, MD  Insulin Syringes, Disposable, U-100 0.3 ML MISC 1 each by Does not apply route daily. 05/09/14   Olam Idler, MD  metFORMIN (GLUCOPHAGE) 1000 MG tablet Take 1 tablet (1,000 mg total) by mouth 2 (two) times daily with a meal. 04/30/19   Matilde Haymaker, MD  ondansetron (ZOFRAN ODT) 4 MG disintegrating tablet Take 1 tablet (4 mg total) by mouth every 8 (eight) hours as needed for nausea or  vomiting. 12/18/17   Charlann Lange, PA-C  oxyCODONE-acetaminophen (PERCOCET/ROXICET) 5-325 MG tablet Take 1 tablet by mouth every 4 (four) hours as needed for severe pain. Patient not taking: Reported on 06/10/2018 12/18/17   Charlann Lange, PA-C  PAZEO 0.7 % SOLN Place 1 drop into both eyes daily. 07/10/17   [provider]  ReliOn Ultra Thin Lancets MISC Patient will need to test his blood glucose once daily prior to breakfast and metformin and amaryl administration. 07/07/13   Olam Idler, MD  RESTASIS 0.05 % ophthalmic emulsion Place 1 drop into both eyes 2 (two) times daily. 07/10/17   [provider]  sacubitril-valsartan (ENTRESTO) 97-103 MG Take 1 tablet by mouth 2 (two) times daily. 04/30/19   Matilde Haymaker, MD  sildenafil (REVATIO) 20 MG  tablet TAKE ONE TABLET BY MOUTH DAILY AS NEEDED Patient not taking: No sig reported 08/12/17   Marjie Skiff, MD  spironolactone (ALDACTONE) 25 MG tablet TAKE ONE TABLET BY MOUTH EVERY DAY. NEED APPT FOR MORE REFILLS Patient taking differently: Take 25 mg by mouth daily.  11/27/17   Larey Dresser, MD  tamsulosin [facility]) 0.4 MG CAPS capsule Take 1 capsule (0.4 mg total) by mouth daily after breakfast. Patient not taking: Reported on 06/10/2018 12/18/17   Charlann Lange, PA-C  UNIFINE PENTIPS 31G X 6 MM MISC USE AS DIRECTED WITH INSULIN 07/29/17   Marjie Skiff, MD    Allergies    Shellfish allergy and Tape  Review of Systems   Review of Systems  All other systems reviewed and are negative.   Physical Exam Updated Vital Signs BP (!) 116/91   Pulse (!) 110   Temp 97.8 F (36.6 C) (Oral)   Resp 15   Ht 5\' 5"  (1.651 m)   Wt 88.5 kg   SpO2 100%   BMI 32.45 kg/m   Physical Exam Vitals and nursing note reviewed.  Constitutional:      Appearance: He is well-developed.  HENT:     Head: Normocephalic and atraumatic.  Eyes:     Conjunctiva/sclera: Conjunctivae normal.  Cardiovascular:     Rate and Rhythm: Normal rate  and regular rhythm.     Heart sounds: No murmur heard.   Pulmonary:     Effort: Pulmonary effort is normal. No respiratory distress.     Comments: Mild crackles Abdominal:     Palpations: Abdomen is soft.     Tenderness: There is no abdominal tenderness.  Musculoskeletal:     Cervical back: Neck supple.     Right lower leg: Edema present.     Left lower leg: Edema present.     Comments: Trace pitting edema in lower extremities  Skin:    General: Skin is warm and dry.  Neurological:     Mental Status: He is alert.  Psychiatric:        Mood and Affect: Mood normal.        Behavior: Behavior normal.     ED Results / Procedures / Treatments   Labs (all labs ordered are listed, but only abnormal results are displayed) Labs Reviewed  CBC WITH DIFFERENTIAL/PLATELET  BASIC METABOLIC PANEL  BRAIN NATRIURETIC PEPTIDE  TROPONIN I (HIGH SENSITIVITY)    EKG EKG Interpretation  Date/Time:  Saturday June 17 2020 22:53:28 EDT Ventricular Rate:  112 PR Interval:  166 QRS Duration: 99 QT Interval:  347 QTC Calculation: 474 R Axis:   -69 Text Interpretation: Sinus tachycardia Probable left atrial enlargement LAD, consider left anterior fascicular block Abnormal R-wave progression, late transition Nonspecific T abnormalities, lateral leads Confirmed by Shanon Rosser [phone]) on 06/17/2020 10:58:08 PM   Radiology DG Chest 2 View  Result Date: 06/17/2020 CLINICAL DATA:  Shortness of breath EXAM: CHEST - 2 VIEW COMPARISON:  09/02/2015 FINDINGS: Cardiomegaly. Left AICD remains in place, unchanged. Mild vascular congestion. No overt edema, confluent opacities or effusions. No acute bony abnormality. IMPRESSION: Cardiomegaly, vascular congestion. Electronically Signed   By: Rolm Baptise M.D.   On: 06/17/2020 23:17    Procedures Procedures   Medications Ordered in ED Medications  albuterol (VENTOLIN HFA) 108 (90 Base) MCG/ACT inhaler 2 puff (has no administration in time range)    ED  Course  I have reviewed the triage vital signs and the nursing notes.  Pertinent labs &  imaging results that were available during my care of the patient were reviewed by me and considered in my medical decision making (see chart for details).    MDM Rules/Calculators/A&P                          Patient with CHF here complaining of shortness of breath that has been gradually worsening.  Will check labs.  Current plan is to give diuretic in the ED and hopefully get patient home.  BNP is elevated at 1200, no recent's to compare to.  Patient given IV Lasix.  He is urinating.  Patient ambulates maintaining greater than 95% SPO2.  He denies any chest pain, shortness of breath, or dizziness while ambulating.  He states that he feels significantly improved.  We will plan for discharge with Lasix at home.  Recommend follow-up with PCP.  Patient understands and agrees the plan.  Return precautions discussed. Final Clinical Impression(s) / ED Diagnoses Final diagnoses:  Acute on chronic congestive heart failure, unspecified heart failure type [facility])    Rx / DC Orders ED Discharge Orders    None       Montine Circle, PA-C 06/18/20 0521    Shanon Rosser, MD 06/18/20 0630

## 2020-06-18 NOTE — ED Notes (Signed)
This RN ambulated patient down hallway while monitoring SpO2  Level remained above 95% Patient denies any CP/SOB/dizziness during ambulation Patient is able to ambulate without assistance Patient returned to room and placed back on monitor, no further needs Call bell within reach, will continue to monitor

## 2020-06-29 ENCOUNTER — Ambulatory Visit: Payer: 59 | Admitting: Family Medicine

## 2020-06-29 ENCOUNTER — Encounter: Payer: Self-pay | Admitting: Family Medicine

## 2020-06-29 ENCOUNTER — Ambulatory Visit (INDEPENDENT_AMBULATORY_CARE_PROVIDER_SITE_OTHER): Payer: 59

## 2020-06-29 ENCOUNTER — Other Ambulatory Visit: Payer: Self-pay

## 2020-06-29 VITALS — BP 149/112 | HR 98 | Ht 65.0 in | Wt 194.4 lb

## 2020-06-29 DIAGNOSIS — Z23 Encounter for immunization: Secondary | ICD-10-CM

## 2020-06-29 DIAGNOSIS — E118 Type 2 diabetes mellitus with unspecified complications: Secondary | ICD-10-CM

## 2020-06-29 DIAGNOSIS — Z794 Long term (current) use of insulin: Secondary | ICD-10-CM

## 2020-06-29 DIAGNOSIS — I480 Paroxysmal atrial fibrillation: Secondary | ICD-10-CM | POA: Diagnosis not present

## 2020-06-29 DIAGNOSIS — I5020 Unspecified systolic (congestive) heart failure: Secondary | ICD-10-CM

## 2020-06-29 DIAGNOSIS — E119 Type 2 diabetes mellitus without complications: Secondary | ICD-10-CM

## 2020-06-29 DIAGNOSIS — I5022 Chronic systolic (congestive) heart failure: Secondary | ICD-10-CM

## 2020-06-29 MED ORDER — LANTUS SOLOSTAR 100 UNIT/ML ~~LOC~~ SOPN: 30.0000 [IU] | PEN_INJECTOR | Freq: Every day | SUBCUTANEOUS | 3 refills | Status: DC

## 2020-06-29 MED ORDER — ACCU-CHEK AVIVA CONNECT W/DEVICE KIT: 1.0000 [IU] | PACK | Freq: Every day | 0 refills | Status: DC

## 2020-06-29 NOTE — Assessment & Plan Note (Signed)
Chart review reveals that he has a history of severe heart failure with an EF of about 15% back in 2015.  More recent echo does show that seems to have improved since that point.  He appears to have stopped taking all of his medication because he felt better.  Today, I reinforced the importance of continuing medication even if he feels good.  He was told to restart all of his cardiac medications.  He noted that he was not able to afford Entresto right now and he was encouraged to follow-up with cardiology because they may able to offer resources to make it more affordable. -Restart carvedilol, spironolactone -Continue Lasix at 20 mg daily -Follow-up echocardiogram -Follow-up with cardiology as soon as possible -Follow-up with me in clinic in 2 weeks -Follow-up BMP

## 2020-06-29 NOTE — Assessment & Plan Note (Signed)
He is not currently taking his Eliquis.  The importance of continuing Eliquis was explained to him. -Restart Eliquis

## 2020-06-29 NOTE — Progress Notes (Signed)
    SUBJECTIVE:   CHIEF COMPLAINT / HPI:   Diastolic heart failure His current prescribed medications include: -Carvedilol 25 mg twice daily -Entresto 97-103 twice daily -Lasix 20 mg daily -Spironolactone 25 mg daily Joseph [name] was seen in the ED on 4/16 at which time he was diagnosed with exacerbation of heart failure.  He reports that he had not been taking any of his medication for roughly the past month.  At that time, he was told to start taking his Lasix again and he was discharged to follow-up with his PCP.  Today in clinic, he notes that he feels significantly improved since he started taking his Lasix again.  He reports that he is not taking any medication apart from Lasix.  Diabetes Is currently prescribed diabetic medication includes -Metformin 1000 mg twice daily -Empagliflozin 10 mg daily -Lantus 30 units daily -Aspart-20 units 3 times daily He reports that he is not currently taking any diabetic medication.  The last time he took any diabetic medication was about a month ago.  He does not currently have a glucose meter at home.  PERTINENT  PMH / PSH: Heart failure with reduced ejection fraction, diastolic dysfunction, history of stroke, hypertension, paroxysmal A. fib  OBJECTIVE:   BP (!) 149/112   Pulse 98   Ht 5\' 5"  (1.651 m)   Wt 194 lb 6.4 oz (88.2 kg)   SpO2 99%   BMI 32.35 kg/m    General: Alert and cooperative and appears to be in no acute distress Cardio: Normal S1 and S2, no S3 or S4. Rhythm is regular. No murmurs or rubs.   Pulm: Clear to auscultation bilaterally, no crackles, wheezing, or diminished breath sounds. Normal respiratory effort on room air.  Able to complete long sentences without effort. Abdomen: Bowel sounds normal. Abdomen soft and non-tender.  Extremities: Minimal peripheral edema. Warm/ well perfused.  Strong radial pulses. Neuro: Cranial nerves grossly intact  ASSESSMENT/PLAN:   HFrEF (heart failure with reduced ejection  fraction) Chart review reveals that he has a history of severe heart failure with an EF of about 15% back in 2015.  More recent echo does show that seems to have improved since that point.  He appears to have stopped taking all of his medication because he felt better.  Today, I reinforced the importance of continuing medication even if he feels good.  He was told to restart all of his cardiac medications.  He noted that he was not able to afford Entresto right now and he was encouraged to follow-up with cardiology because they may able to offer resources to make it more affordable. -Restart carvedilol, spironolactone -Continue Lasix at 20 mg daily -Follow-up echocardiogram -Follow-up with cardiology as soon as possible -Follow-up with me in clinic in 2 weeks -Follow-up BMP  Paroxysmal atrial fibrillation ([place]) He is not currently taking his Eliquis.  The importance of continuing Eliquis was explained to him. -Restart Eliquis  Long-term insulin use in type 2 diabetes ([place]) Last A1c was 11.  He is not currently taking any diabetes medication.  The importance of taking his medication was impressed upon him. -Restart metformin 1000 mg twice daily -Restart Lantus 30 units daily -Check your blood glucose every morning -Glucometer sent in -Return to clinic in 2 weeks     Joseph Haymaker, Joseph [name] [place]

## 2020-06-29 NOTE — Assessment & Plan Note (Signed)
Last A1c was 11.  He is not currently taking any diabetes medication.  The importance of taking his medication was impressed upon him. -Restart metformin 1000 mg twice daily -Restart Lantus 30 units daily -Check your blood glucose every morning -Glucometer sent in -Return to clinic in 2 weeks

## 2020-06-29 NOTE — Patient Instructions (Signed)
It was great to see you today.  Here is a quick review of the things we talked about:  Heart failure: It sounds like your heart failure has gotten worse because he has been taking your medication.  Please start taking all of your medication today.  Probably the 2 most important medicines are your Lasix and your Eliquis.  All of your medications are important.  I would like you to start taking all of them again.  Please let me know if you need any additional refills.  We are scheduling you for an echocardiogram and we will try to get you an appointment with your heart doctor in the next couple of weeks.  Diabetes: Your diabetes is poorly controlled right now.  I would like you to start taking your metformin again in addition to your long-acting insulin.  We will need to do more work for your diabetes but does not afford today.  Please come back to clinic in 2 weeks.   If all of your labs are normal, I will send you a message over my chart or send you a letter.  If there is anything to discuss, I will give you a phone call.

## 2020-06-30 ENCOUNTER — Other Ambulatory Visit: Payer: Self-pay | Admitting: Family Medicine

## 2020-06-30 DIAGNOSIS — I5022 Chronic systolic (congestive) heart failure: Secondary | ICD-10-CM

## 2020-06-30 LAB — BASIC METABOLIC PANEL
BUN/Creatinine Ratio: 11 (ref 9–20)
BUN: 18 mg/dL (ref 6–24)
CO2: 25 mmol/L (ref 20–29)
Calcium: 9.5 mg/dL (ref 8.7–10.2)
Chloride: 100 mmol/L (ref 96–106)
Creatinine, Ser: 1.61 mg/dL — ABNORMAL HIGH (ref 0.76–1.27)
Glucose: 258 mg/dL — ABNORMAL HIGH (ref 65–99)
Potassium: 4.3 mmol/L (ref 3.5–5.2)
Sodium: 140 mmol/L (ref 134–144)
eGFR: 50 mL/min/{1.73_m2} — ABNORMAL LOW (ref >59)

## 2020-07-04 ENCOUNTER — Other Ambulatory Visit: Payer: Self-pay

## 2020-07-04 ENCOUNTER — Other Ambulatory Visit: Payer: 59

## 2020-07-04 DIAGNOSIS — I5022 Chronic systolic (congestive) heart failure: Secondary | ICD-10-CM

## 2020-07-05 LAB — BASIC METABOLIC PANEL
BUN/Creatinine Ratio: 15 (ref 9–20)
BUN: 28 mg/dL — ABNORMAL HIGH (ref 6–24)
CO2: 19 mmol/L — ABNORMAL LOW (ref 20–29)
Calcium: 9.1 mg/dL (ref 8.7–10.2)
Chloride: 101 mmol/L (ref 96–106)
Creatinine, Ser: 1.88 mg/dL — ABNORMAL HIGH (ref 0.76–1.27)
Glucose: 115 mg/dL — ABNORMAL HIGH (ref 65–99)
Potassium: 4.5 mmol/L (ref 3.5–5.2)
Sodium: 136 mmol/L (ref 134–144)
eGFR: 41 mL/min/{1.73_m2} — ABNORMAL LOW (ref >59)

## 2020-07-17 ENCOUNTER — Ambulatory Visit (INDEPENDENT_AMBULATORY_CARE_PROVIDER_SITE_OTHER): Payer: 59 | Admitting: Family Medicine

## 2020-07-17 ENCOUNTER — Encounter: Payer: Self-pay | Admitting: Family Medicine

## 2020-07-17 ENCOUNTER — Other Ambulatory Visit: Payer: Self-pay

## 2020-07-17 VITALS — BP 114/80 | HR 94 | Ht 65.0 in | Wt 201.0 lb

## 2020-07-17 DIAGNOSIS — I5022 Chronic systolic (congestive) heart failure: Secondary | ICD-10-CM

## 2020-07-17 DIAGNOSIS — Z794 Long term (current) use of insulin: Secondary | ICD-10-CM | POA: Diagnosis not present

## 2020-07-17 DIAGNOSIS — E119 Type 2 diabetes mellitus without complications: Secondary | ICD-10-CM | POA: Diagnosis not present

## 2020-07-17 DIAGNOSIS — I5020 Unspecified systolic (congestive) heart failure: Secondary | ICD-10-CM

## 2020-07-17 DIAGNOSIS — I480 Paroxysmal atrial fibrillation: Secondary | ICD-10-CM | POA: Diagnosis not present

## 2020-07-17 LAB — POCT GLYCOSYLATED HEMOGLOBIN (HGB A1C): HbA1c, POC (controlled diabetic range): 8.4 % — AB (ref 0.0–7.0)

## 2020-07-17 MED ORDER — LANTUS SOLOSTAR 100 UNIT/ML ~~LOC~~ SOPN: 30.0000 [IU] | PEN_INJECTOR | Freq: Every day | SUBCUTANEOUS | 3 refills | Status: DC

## 2020-07-17 MED ORDER — SPIRONOLACTONE 25 MG PO TABS: 25.0000 mg | ORAL_TABLET | Freq: Every day | ORAL | 3 refills | Status: DC

## 2020-07-17 MED ORDER — ACCU-CHEK AVIVA CONNECT W/DEVICE KIT: 1.0000 [IU] | PACK | Freq: Every day | 0 refills | Status: AC

## 2020-07-17 NOTE — Assessment & Plan Note (Signed)
Significantly improved from his last visit.  His last blood work did show an AKI and he was transition to every other day Lasix.  We will recheck blood work today to assess kidney function. -Follow-up BMP to assess kidney function -Continue every other day Lasix for now in addition to his beta-blocker and spironolactone. -He was given contact information for cardiology and instructed to follow-up in the next several weeks.  I would like him to talk to cardiology about affordable Entresto.  If he cannot afford Entresto, I will start him on ACE/ARB. -Follow-up in 2 weeks

## 2020-07-17 NOTE — Assessment & Plan Note (Signed)
A1c 8.4 today.  Relatively good considering he had not been taking any medications prior to 2 weeks ago. -Continue metformin 1000 twice daily -Continue Lantus 30 units daily -Continue checking a.m. blood glucose -Diabetic foot exam performed today, no neuropathy -Follow-up in 3 months for diabetes check -May benefit from restarting his SGLT2 inhibitor although that is an expensive medication for him at the moment.

## 2020-07-17 NOTE — Patient Instructions (Signed)
Heart failure: Good job getting restarted on all of her medications.  I think it is very important for you to establish care again with your cardiologist, Dr. Aundra Dubin.  I will attach his contact information below. -Continue Lasix 20 mg every other day -Regard to recheck your blood work today to make sure your kidney function is appropriate -The contact number for cardiologist [phone]  Diabetes: Good job getting restarted on your metformin.  For now lets continue with your current medications. -Continue metformin 1000 mg twice daily -Continue Lantus 30 units daily -In the future, it may be helpful to start an SGLT2 inhibitor, a diabetes medication that has heart failure benefit.

## 2020-07-17 NOTE — Progress Notes (Signed)
    SUBJECTIVE:   CHIEF COMPLAINT / HPI:   Heart failure with reduced ejection fraction Since her last visit, Mr. Mendizabal has been taking the following medications: -20 mg every other day -Carvedilol 25 mg twice daily -Spironolactone 25 mg daily He reports significant improvement in his respiratory status and volume status in the past 2 weeks.  He specifically denies chest pain.  He occasionally has palpitations in the morning after waking up and still experiences mild shortness of breath with exertion.  He has not yet called cardiology to schedule follow-up appointment.  He is scheduled for his echocardiogram on 3 June.  Diabetes His current medications include: -Insulin 30 units daily -Metformin 1000 mg twice daily He has been checking his fasting glucose every morning and has brought his record with him.  His record shows that his fasting blood glucoses are generally between 90-150 in the morning although he does have some outliers above 150 as high as 278.  A. fib His current medications include: -Apixaban 5 mg twice daily -Carvedilol 25 mg daily   PERTINENT  PMH / PSH:   OBJECTIVE:   BP 114/80   Pulse 94   Ht 5\' 5"  (1.651 m)   Wt 201 lb (91.2 kg)   SpO2 97%   BMI 33.45 kg/m    General: Alert and cooperative and appears to be in no acute distress Cardio: Normal S1 and S2, no S3 or S4. Rhythm is regular. No murmurs or rubs.   Pulm: Clear to auscultation bilaterally, no crackles, wheezing, or diminished breath sounds. Normal respiratory effort Abdomen: Bowel sounds normal. Abdomen soft and non-tender.  Extremities: 2+ pitting edema in lower extremities bilaterally.  Strong radial pulses. Neuro: Cranial nerves grossly intact  Diabetic Foot Exam - Simple   Simple Foot Form Visual Inspection No deformities, no ulcerations, no other skin breakdown bilaterally: Yes Sensation Testing Intact to touch and monofilament testing bilaterally: Yes Pulse Check Posterior Tibialis  and Dorsalis pulse intact bilaterally: Yes Comments      ASSESSMENT/PLAN:   HFrEF (heart failure with reduced ejection fraction) Significantly improved from his last visit.  His last blood work did show an AKI and he was transition to every other day Lasix.  We will recheck blood work today to assess kidney function. -Follow-up BMP to assess kidney function -Continue every other day Lasix for now in addition to his beta-blocker and spironolactone. -He was given contact information for cardiology and instructed to follow-up in the next several weeks.  I would like him to talk to cardiology about affordable Entresto.  If he cannot afford Entresto, I will start him on ACE/ARB. -Follow-up in 2 weeks  Long-term insulin use in type 2 diabetes (HCC) A1c 8.4 today.  Relatively good considering he had not been taking any medications prior to 2 weeks ago. -Continue metformin 1000 twice daily -Continue Lantus 30 units daily -Continue checking a.m. blood glucose -Diabetic foot exam performed today, no neuropathy -Follow-up in 3 months for diabetes check -May benefit from restarting his SGLT2 inhibitor although that is an expensive medication for him at the moment.  Paroxysmal atrial fibrillation (HCC) Regular rate and rhythm today. -Continue apixaban 5 mg twice daily     Matilde Haymaker, MD [place]

## 2020-07-17 NOTE — Assessment & Plan Note (Signed)
Regular rate and rhythm today. -Continue apixaban 5 mg twice daily

## 2020-07-18 LAB — BASIC METABOLIC PANEL
BUN/Creatinine Ratio: 16 (ref 9–20)
BUN: 22 mg/dL (ref 6–24)
CO2: 18 mmol/L — ABNORMAL LOW (ref 20–29)
Calcium: 9.7 mg/dL (ref 8.7–10.2)
Chloride: 104 mmol/L (ref 96–106)
Creatinine, Ser: 1.34 mg/dL — ABNORMAL HIGH (ref 0.76–1.27)
Glucose: 103 mg/dL — ABNORMAL HIGH (ref 65–99)
Potassium: 4.7 mmol/L (ref 3.5–5.2)
Sodium: 140 mmol/L (ref 134–144)
eGFR: 62 mL/min/{1.73_m2} (ref >59)

## 2020-08-02 ENCOUNTER — Ambulatory Visit: Payer: 59 | Admitting: Family Medicine

## 2020-08-02 ENCOUNTER — Encounter: Payer: Self-pay | Admitting: Family Medicine

## 2020-08-02 ENCOUNTER — Other Ambulatory Visit: Payer: Self-pay

## 2020-08-02 VITALS — BP 132/88 | Ht 65.0 in | Wt 189.5 lb

## 2020-08-02 DIAGNOSIS — I5020 Unspecified systolic (congestive) heart failure: Secondary | ICD-10-CM | POA: Diagnosis not present

## 2020-08-02 DIAGNOSIS — I5022 Chronic systolic (congestive) heart failure: Secondary | ICD-10-CM

## 2020-08-02 DIAGNOSIS — I480 Paroxysmal atrial fibrillation: Secondary | ICD-10-CM

## 2020-08-02 DIAGNOSIS — F17200 Nicotine dependence, unspecified, uncomplicated: Secondary | ICD-10-CM

## 2020-08-02 DIAGNOSIS — E119 Type 2 diabetes mellitus without complications: Secondary | ICD-10-CM

## 2020-08-02 DIAGNOSIS — Z794 Long term (current) use of insulin: Secondary | ICD-10-CM

## 2020-08-02 MED ORDER — FUROSEMIDE 40 MG PO TABS: 20.0000 mg | ORAL_TABLET | Freq: Every day | ORAL | 0 refills | Status: DC

## 2020-08-02 MED ORDER — ELIQUIS 5 MG PO TABS: 5.0000 mg | ORAL_TABLET | Freq: Two times a day (BID) | ORAL | 3 refills | Status: DC

## 2020-08-02 NOTE — Assessment & Plan Note (Signed)
Regular rhythm today.  Eliquis refilled.

## 2020-08-02 NOTE — Patient Instructions (Signed)
Heart failure: It looks like you are doing quite a bit better since we have been able to restart your medicine.  I sent in the refills that you needed today.  We will going get some blood work to make sure your kidneys are doing okay.  Continue take your Lasix 20 mg every other day.  I will let you know if there are any concerning findings from your echocardiogram in 2 days.  Please come back to clinic after you have seen cardiology.  Feel free to return to clinic earlier if you are having any new symptoms.  Diabetes: It looks like you need a new battery for your glucometer.  A. fib: I have refilled your blood thinners.

## 2020-08-02 NOTE — Assessment & Plan Note (Signed)
Stable since last visit.  He is not consistently taking his daily medications.  I would like him to start either Entresto or an ACE/ARB.  I will wait until he sees cardiology before starting a new medication.  I would like cardiology to get him back on Entresto cheaply if possible.  If he cannot be restarted on Entresto, I will start an ACE/ARB at his next visit.  His blood pressure is just a touch high today but I think that is appropriate for starting Entresto as that will significantly lower his blood pressure. -Follow-up BMP to assess kidney function -Continue current regimen -Follow-up in 1.5 months after you have seen cardiology -Follow-up echo in 2 days

## 2020-08-02 NOTE — Assessment & Plan Note (Signed)
He would like to cut back/quit on his own without any additional help at this time.  He was encouraged to reach out if he wanted additional resources.

## 2020-08-02 NOTE — Assessment & Plan Note (Signed)
Fasting blood glucoses ranging from 86-130 with a few outliers above that.  Overall, his blood glucose seems to be improving. -Replace battery glucometer in order to keep using this glucometer.  He does not need replacement at this time. -Continue current regimen -Return to clinic in 1.5 months for A1c -Foot exam at next appointment -Follow-up with ophthalmology for diabetic eye exam

## 2020-08-02 NOTE — Progress Notes (Signed)
    SUBJECTIVE:   CHIEF COMPLAINT / HPI:   Congestive heart failure His current medication includes: -Carvedilol 25 mg twice daily -lasix 20 mg QOD -spirolonolactone 25 QD -jardiance 10 QD He reports that he has been diligent taking his medication.  He believes he has missed only 1 day of medicine this past week.  He is currently set up to have an echocardiogram done in 2 days.  He has a appointment scheduled with cardiology in the middle of July with at which time he was hoping to get restarted on Entresto.  Diabetes His current medication includes: -Jardiance 10 mg daily -metformin 1000 mg BID - insulin 30 units Q AM He brought his fasting blood glucose measurements with him in clinic today which include: 5/17: 136 5/18: 197 5/19: 87 5/20: 117 5/21: 126 5/22: 101 5/23: 128 5/24: 110 5/25: 99 5/26: 107 5/27: 167 5/28: 86 5/29: 138 5/30: 124 5/31: 109  A-fib Current medications include: - carvedolol 25 BID - eliquis 5 BID  PERTINENT  PMH / PSH: Heart failure, diabetes, history of stroke  OBJECTIVE:   BP 132/88   Ht 5\' 5"  (1.651 m)   Wt 189 lb 8 oz (86 kg)   BMI 31.53 kg/m    General: Alert and cooperative and appears to be in no acute distress HEENT: No evidence of JVD. Cardio: Normal S1 and S2, no S3 or S4. Rhythm is regular. No murmurs or rubs.   Pulm: Clear to auscultation bilaterally, no crackles, wheezing, or diminished breath sounds. Normal respiratory effort Abdomen: Bowel sounds normal. Abdomen soft and non-tender.  Extremities: No peripheral edema. Warm/ well perfused.  Strong radial pulses. Neuro: Cranial nerves grossly intact  ASSESSMENT/PLAN:   Chronic systolic heart failure (HCC) Stable since last visit.  He is not consistently taking his daily medications.  I would like him to start either Entresto or an ACE/ARB.  I will wait until he sees cardiology before starting a new medication.  I would like cardiology to get him back on Entresto  cheaply if possible.  If he cannot be restarted on Entresto, I will start an ACE/ARB at his next visit.  His blood pressure is just a touch high today but I think that is appropriate for starting Entresto as that will significantly lower his blood pressure. -Follow-up BMP to assess kidney function -Continue current regimen -Follow-up in 1.5 months after you have seen cardiology -Follow-up echo in 2 days  Paroxysmal atrial fibrillation (HCC) Regular rhythm today.  Eliquis refilled.  Smoking He would like to cut back/quit on his own without any additional help at this time.  He was encouraged to reach out if he wanted additional resources.  Long-term insulin use in type 2 diabetes ([place]) Fasting blood glucoses ranging from 86-130 with a few outliers above that.  Overall, his blood glucose seems to be improving. -Replace battery glucometer in order to keep using this glucometer.  He does not need replacement at this time. -Continue current regimen -Return to clinic in 1.5 months for A1c -Foot exam at next appointment -Follow-up with ophthalmology for diabetic eye exam     Matilde Haymaker, MD [place]

## 2020-08-03 LAB — BASIC METABOLIC PANEL
BUN/Creatinine Ratio: 14 (ref 9–20)
BUN: 19 mg/dL (ref 6–24)
CO2: 17 mmol/L — ABNORMAL LOW (ref 20–29)
Calcium: 9.5 mg/dL (ref 8.7–10.2)
Chloride: 104 mmol/L (ref 96–106)
Creatinine, Ser: 1.4 mg/dL — ABNORMAL HIGH (ref 0.76–1.27)
Glucose: 122 mg/dL — ABNORMAL HIGH (ref 65–99)
Potassium: 4.7 mmol/L (ref 3.5–5.2)
Sodium: 140 mmol/L (ref 134–144)
eGFR: 59 mL/min/{1.73_m2} — ABNORMAL LOW (ref >59)

## 2020-08-04 ENCOUNTER — Ambulatory Visit (HOSPITAL_COMMUNITY)
Admission: RE | Admit: 2020-08-04 | Discharge: 2020-08-04 | Disposition: A | Discharge status: Home / Self Care | Payer: 59 | Source: Ambulatory Visit | Attending: Family Medicine | Admitting: Family Medicine

## 2020-08-04 ENCOUNTER — Other Ambulatory Visit: Payer: Self-pay

## 2020-08-04 DIAGNOSIS — I5022 Chronic systolic (congestive) heart failure: Secondary | ICD-10-CM | POA: Diagnosis not present

## 2020-08-04 DIAGNOSIS — E119 Type 2 diabetes mellitus without complications: Secondary | ICD-10-CM | POA: Diagnosis not present

## 2020-08-04 DIAGNOSIS — I5043 Acute on chronic combined systolic (congestive) and diastolic (congestive) heart failure: Secondary | ICD-10-CM | POA: Insufficient documentation

## 2020-08-04 DIAGNOSIS — I4891 Unspecified atrial fibrillation: Secondary | ICD-10-CM | POA: Diagnosis not present

## 2020-08-04 DIAGNOSIS — F17200 Nicotine dependence, unspecified, uncomplicated: Secondary | ICD-10-CM | POA: Diagnosis not present

## 2020-08-04 LAB — ECHOCARDIOGRAM COMPLETE
Area-P 1/2: 4.93 cm2
Calc EF: 35.7 %
S' Lateral: 4.4 cm
Single Plane A2C EF: 36.3 %
Single Plane A4C EF: 34.5 %

## 2020-08-04 NOTE — Progress Notes (Signed)
  Echocardiogram 2D Echocardiogram with strain has been performed.  Joseph [name] M 08/04/2020, 9:28 AM

## 2020-08-07 ENCOUNTER — Telehealth: Payer: Self-pay | Admitting: *Deleted

## 2020-08-07 NOTE — Telephone Encounter (Signed)
Received voicemail from [place] with the [facility] center.  Patient was scheduled for an echo and had it performed before PA was obtained.  [employer] said that it didn't qualify for post service authorization.  Johnney Ou   Case Number: [id number] Review Date: 08/07/2020 8:29:54 AM Expiration Date: N/A Status: This case does not meet the parameters for Post Service review.

## 2020-08-21 ENCOUNTER — Other Ambulatory Visit: Payer: Self-pay

## 2020-08-22 MED ORDER — METFORMIN HCL 1000 MG PO TABS: 1000.0000 mg | ORAL_TABLET | Freq: Two times a day (BID) | ORAL | 2 refills | Status: DC

## 2020-08-22 MED ORDER — CARVEDILOL 25 MG PO TABS: 25.0000 mg | ORAL_TABLET | Freq: Two times a day (BID) | ORAL | 3 refills | Status: DC

## 2020-09-19 ENCOUNTER — Telehealth: Payer: Self-pay | Admitting: Family Medicine

## 2020-09-19 ENCOUNTER — Ambulatory Visit (HOSPITAL_COMMUNITY)
Admission: RE | Admit: 2020-09-19 | Discharge: 2020-09-19 | Disposition: A | Discharge status: Home / Self Care | Payer: 59 | Source: Ambulatory Visit | Attending: Cardiology | Admitting: Cardiology

## 2020-09-19 ENCOUNTER — Other Ambulatory Visit (HOSPITAL_COMMUNITY): Payer: Self-pay

## 2020-09-19 ENCOUNTER — Other Ambulatory Visit: Payer: Self-pay

## 2020-09-19 ENCOUNTER — Telehealth (HOSPITAL_COMMUNITY): Payer: Self-pay | Admitting: Pharmacy Technician

## 2020-09-19 ENCOUNTER — Encounter (HOSPITAL_COMMUNITY): Payer: Self-pay | Admitting: Cardiology

## 2020-09-19 VITALS — BP 140/88 | HR 87 | Wt 187.2 lb

## 2020-09-19 DIAGNOSIS — F17210 Nicotine dependence, cigarettes, uncomplicated: Secondary | ICD-10-CM | POA: Diagnosis not present

## 2020-09-19 DIAGNOSIS — Z794 Long term (current) use of insulin: Secondary | ICD-10-CM | POA: Insufficient documentation

## 2020-09-19 DIAGNOSIS — I110 Hypertensive heart disease with heart failure: Secondary | ICD-10-CM | POA: Diagnosis present

## 2020-09-19 DIAGNOSIS — Z7901 Long term (current) use of anticoagulants: Secondary | ICD-10-CM | POA: Insufficient documentation

## 2020-09-19 DIAGNOSIS — Z8673 Personal history of transient ischemic attack (TIA), and cerebral infarction without residual deficits: Secondary | ICD-10-CM | POA: Diagnosis not present

## 2020-09-19 DIAGNOSIS — Z79899 Other long term (current) drug therapy: Secondary | ICD-10-CM | POA: Insufficient documentation

## 2020-09-19 DIAGNOSIS — I480 Paroxysmal atrial fibrillation: Secondary | ICD-10-CM | POA: Insufficient documentation

## 2020-09-19 DIAGNOSIS — G4733 Obstructive sleep apnea (adult) (pediatric): Secondary | ICD-10-CM | POA: Insufficient documentation

## 2020-09-19 DIAGNOSIS — I5022 Chronic systolic (congestive) heart failure: Secondary | ICD-10-CM | POA: Insufficient documentation

## 2020-09-19 DIAGNOSIS — I428 Other cardiomyopathies: Secondary | ICD-10-CM | POA: Diagnosis not present

## 2020-09-19 LAB — BASIC METABOLIC PANEL
Anion gap: 8 (ref 5–15)
BUN: 18 mg/dL (ref 6–20)
CO2: 23 mmol/L (ref 22–32)
Calcium: 9 mg/dL (ref 8.9–10.3)
Chloride: 108 mmol/L (ref 98–111)
Creatinine, Ser: 1.27 mg/dL — ABNORMAL HIGH (ref 0.61–1.24)
GFR, Estimated: >60 mL/min (ref >60)
Glucose, Bld: 125 mg/dL — ABNORMAL HIGH (ref 70–99)
Potassium: 4.1 mmol/L (ref 3.5–5.1)
Sodium: 139 mmol/L (ref 135–145)

## 2020-09-19 LAB — CBC
HCT: 49.1 % (ref 39.0–52.0)
Hemoglobin: 16.4 g/dL (ref 13.0–17.0)
MCH: 32.6 pg (ref 26.0–34.0)
MCHC: 33.4 g/dL (ref 30.0–36.0)
MCV: 97.6 fL (ref 80.0–100.0)
Platelets: 162 10*3/uL (ref 150–400)
RBC: 5.03 MIL/uL (ref 4.22–5.81)
RDW: 16.2 % — ABNORMAL HIGH (ref 11.5–15.5)
WBC: 7 10*3/uL (ref 4.0–10.5)
nRBC: 0 % (ref 0.0–0.2)

## 2020-09-19 LAB — BRAIN NATRIURETIC PEPTIDE: B Natriuretic Peptide: 340.1 pg/mL — ABNORMAL HIGH (ref 0.0–100.0)

## 2020-09-19 MED ORDER — ENTRESTO 24-26 MG PO TABS: 1.0000 | ORAL_TABLET | Freq: Two times a day (BID) | ORAL | 11 refills | Status: DC

## 2020-09-19 MED ORDER — SPIRONOLACTONE 25 MG PO TABS
25.0000 mg | ORAL_TABLET | Freq: Every day | ORAL | 3 refills | Status: DC
Start: 2020-09-19 — End: 2021-05-08

## 2020-09-19 NOTE — Patient Instructions (Signed)
RESTART Entresto 24/26 mg, one tab twice daily  Labs today We will only contact you if something comes back abnormal or we need to make some changes. Otherwise no news is good news!  Your physician recommends that you schedule a follow-up appointment in: 3 weeks with the pharmacy team    You are scheduled for a Cardiac Catheterization on Friday, July 29 with Dr. Loralie Champagne.  1. Please arrive at the [facility] (Main Entrance A) at [facility]: [address] [place], [place] [address] at 10:30 AM (This time is two hours before your procedure to ensure your preparation). Free valet parking service is available.   Special note: Every effort is made to have your procedure done on time. Please understand that emergencies sometimes delay scheduled procedures.  2. Diet: Do not eat solid foods after midnight.  The patient may have clear liquids until 5am upon the day of the procedure.  3. Labs: Pre  4. Medication instructions in preparation for your procedure:   Contrast Allergy: No    Stop taking Eliquis (Apixiban) on Thursday, July 28. And the day of procedure Friday July 29.  Stop taking, Lasix (Furosemide)  Friday, July 29,  Take only 15 units of insulin the night before your procedure. Do not take any insulin on the day of the procedure.  Do not take Diabetes Med Glucophage (Metformin) on the day of the procedure and HOLD 48 HOURS AFTER THE PROCEDURE.  On the morning of your procedure, take your Aspirin and any morning medicines NOT listed above.  You may use sips of water.  5. Plan for one night stay--bring personal belongings. 6. Bring a current list of your medications and current insurance cards. 7. You MUST have a responsible person to drive you home. 8. Someone MUST be with you the first 24 hours after you arrive home or your discharge will be delayed. 9. Please wear clothes that are easy to get on and off and wear slip-on shoes.  Thank you for allowing [place] to  care for you!   -- [facility]

## 2020-09-19 NOTE — Telephone Encounter (Signed)
Pt walked in to request refill of:  Name of Medication(s):  Lantus SoloStar  and Spironolactone Last date of OV:  08/02/20 Pharmacy:  [facility] on [address]   Will route refill request to Clinic RN.  Discussed with patient policy to call pharmacy for future refills.  Also, discussed refills may take up to 48 hours to approve or deny.  Creig Hines

## 2020-09-19 NOTE — Telephone Encounter (Addendum)
Advanced Heart Failure Patient Advocate Encounter   Received notification from [employer] that prior authorization for Delene Loll is required.   PA submitted on CoverMyMeds Key [id number] Status is pending   Will continue to follow.

## 2020-09-19 NOTE — H&P (View-Only) (Signed)
Patient ID: Joseph [name], male   DOB: October 01, 1965, 55 y.o.   MRN: [id number]     Advanced Heart Failure Clinic Note   PCP: Dr. Pilar Plate EP: Dr Caryl Comes  HF: Dr. Aundra Dubin   HPI: ACY ORSAK is a 55 y.o. male with history of chronic systolic HF, nonischemic cardiomyopathy, HTN, TIA, stroke, DM2, paroxysmal atrial fibrillation, tobacco abuse and polysubstance abuse.   Patient had atrial fibrillation noted on ICD interrogation and started apixaban in 1/16.   Echo in 5/18 showed improvement in EF to 50-55% with mild LVH.   Patient was lost to followup for about 4 years and stopped all his medications, it appears. He was seen in the ER in 4/22 with dyspnea/CHF and was started on Lasix.  He was seen by primary care and started back on some of his CHF medications.  Echo in 6/22 showed EF down to 25-30% with mild LV dilation, global hypokinesis, mildly decreased RV systolic function.   He was referred by Dr. Pilar Plate back to CHF clinic.  He has been feeling better since starting back on Lasix, Coreg, spironolactone, and Jardiance.  He is short of breath walking up stairs or hills.  No dyspnea walking on flat ground.  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  He is able to do his job without significant dyspnea. No orthopnea/PND.  No lightheadedness.  BP mildly elevated today.  Still smoking 1 ppd.   Labs (5/18): K 4.5, creatinine 1.0 Labs (8/18): hgb 13.3, LDL 51, K 3.9, creatinine 2.27 Labs (10/18): K 4.4, creatinine 1.19 Labs (6/22): K 4.7, creatinine 1.4  ECG (personally reviewed): NSR, LAFB, inferior Qs  SH: No drugs as of 06/28/13. Quit smoking in 10/18. No ETOH.   FH: Mother: DM2, living        Father deceased, no issues  Review of systems complete and found to be negative unless listed in HPI.    PMH: 1) HTN 2) NICM: LHC (06/2013): Mild coronary obstructive disease with 30% narrowing in the mid AV groove circumflex, and 30% proximal and 40% mid RCA stenoses  with otherwise normal LAD and ramus intermediate vessel.  RHC (06/2013) RA: 6, RV: 26/9, PA: 26/13, PC: a 18 mean 13, CO/CI Fick: 3.7/2.0, left ventriculography EF 15%; ECHO (06/2013) EF 20%, diffuse HK, mild MR; TEE (07/2013) no thrombus; ECHO (10/2013): EF 20-25%, diff HK, LA mod dilated; [employer] ICD implanted (12/01/13).  Echo (4/16) with EF 30-35%, diffuse hypokinesis, normal RV.  - Echo (5/18) with EF 50-55%, mild LVH.  - Echo (6/22) with EF down to 25-30% with mild LV dilation, global hypokinesis, mildly decreased RV systolic function.  3) DM2 4) Stroke: MRI (07/2013) revealed an acute infarction, with a greater than 3 cm infarct involving left frontal opercular cortex and subcortical white matter. Carotid dopplers (07/2013) mild soft plaque origin ICA. 1-39% ICA stenosis. Vertebral artery flow is antegrade 5) Polysubstance abuse: + cocaine UDS in past.  6) Atrial fibrillation: Paroxysmal, started Eliquis in 1/16.  7) Nephrolithiasis with AKI   Current Outpatient Medications  Medication Sig Dispense Refill   apixaban (ELIQUIS) 5 MG TABS tablet Take 1 tablet (5 mg total) by mouth 2 (two) times daily. 180 tablet 3   atorvastatin (LIPITOR) 80 MG tablet Take 1 tablet (80 mg total) by mouth every evening. 90 tablet 3   Blood Glucose Monitoring Suppl (ACCU-CHEK AVIVA CONNECT) w/Device KIT 1 Units by Does not apply route daily.  1 kit 0   carvedilol (COREG) 25 MG tablet Take 1 tablet (25 mg total) by mouth 2 (two) times daily with a meal. 60 tablet 3   empagliflozin (JARDIANCE) 10 MG TABS tablet Take 10 mg by mouth daily. 90 tablet 3   furosemide (LASIX) 40 MG tablet Take 0.5 tablets (20 mg total) by mouth daily. 30 tablet 0   glucose blood (ACCU-CHEK AVIVA) test strip Use as instructed 100 each 12   insulin glargine (LANTUS SOLOSTAR) 100 UNIT/ML Solostar Pen Inject 30 Units into the skin daily. 15 mL 3   Insulin Syringes, Disposable, U-100 0.3 ML MISC 1 each by Does not apply route daily. 100  each 1   metFORMIN (GLUCOPHAGE) 1000 MG tablet Take 1,000 mg by mouth 2 (two) times daily with a meal.     ReliOn Ultra Thin Lancets MISC Patient will need to test his blood glucose once daily prior to breakfast and metformin and amaryl administration. 100 each 2   sacubitril-valsartan (ENTRESTO) 24-26 MG Take 1 tablet by mouth 2 (two) times daily. 60 tablet 11   tamsulosin (FLOMAX) 0.4 MG CAPS capsule Take 1 capsule (0.4 mg total) by mouth daily after breakfast. 10 capsule 0   UNIFINE PENTIPS 31G X 6 MM MISC USE AS DIRECTED WITH INSULIN 100 each 0   spironolactone (ALDACTONE) 25 MG tablet Take 1 tablet (25 mg total) by mouth daily. 60 tablet 3   No current facility-administered medications for this encounter.     Vitals:   09/19/20 1135  BP: 140/88  Pulse: 87  SpO2: 98%  Weight: 84.9 kg (187 lb 3.2 oz)   Wt Readings from Last 3 Encounters:  09/19/20 84.9 kg (187 lb 3.2 oz)  08/02/20 86 kg (189 lb 8 oz)  07/17/20 91.2 kg (201 lb)     PHYSICAL EXAM: General: NAD Neck: No JVD, no thyromegaly or thyroid nodule.  Lungs: Clear to auscultation bilaterally with normal respiratory effort. CV: Nondisplaced PMI.  Heart regular S1/S2, no S3/S4, no murmur.  No peripheral edema.  No carotid bruit.  Normal pedal pulses.  Abdomen: Soft, nontender, no hepatosplenomegaly, no distention.  Skin: Intact without lesions or rashes.  Neurologic: Alert and oriented x 3.  Psych: Normal affect. Extremities: No clubbing or cyanosis.  HEENT: Normal.   ASSESSMENT & PLAN:  1) Chronic systolic HF: Nonischemic cardiomyopathy s/p [employer] ICD. Narrow QRS so not CRT candidate. Echo 06/2014 showed 30-35% with diffuse hypokinesis. Echo in 5/18 with EF improved to 50-55%. He stopped all his meds and was lost to followup for about 4 years.  Echo in 6/22 showed EF significantly down to 25-30% with mild RV dysfunction.  NYHA class II symptoms, improved after restarting meds.  Not volume overloaded on exam.     - Continue coreg 25 mg BID.  - Continue spironolactone 25 mg daily.  - Start Entresto 24/26 bid. BMET/BNP today, BMET 10 days.  - Continue empagliflozin.  - Continue Lasix 20 mg every other day.  - With significant fall in EF and ongoing smoking/CHF risk factors, I will arrange for right and left heart cath to assess filling pressures/CO as well as to look for any obstructive coronary disease. Discussed risks/benefits with patient and he agrees to procedure.  2) Atrial fibrillation: Paroxysmal, NSR today.  - Continue Eliquis 5 mg BID.  3) OSA: Using CPAP.   4) Polysubstance abuse: Has been drug free since 06/2013.  5) Stroke: In 5/15, likely related to atrial fibrillation. Now on Eliquis.  6) Smoking: Still smoking 1 ppd.  I recommended cessation.  He is not interested in Chantix or Wellbutrin currently.    Followup with HF pharmacist in 3 wks for medication titration, see me in 6 wks.   Loralie Champagne, MD  09/19/2020

## 2020-09-19 NOTE — Progress Notes (Signed)
Patient ID: Joseph [name], male   DOB: 20-Dec-1965, 55 y.o.   MRN: [id number]     Advanced Heart Failure Clinic Note   PCP: Dr. Pilar Plate EP: Dr Caryl Comes  HF: Dr. Aundra Dubin   HPI: Joseph [name] is a 55 y.o. male with history of chronic systolic HF, nonischemic cardiomyopathy, HTN, TIA, stroke, DM2, paroxysmal atrial fibrillation, tobacco abuse and polysubstance abuse.   Patient had atrial fibrillation noted on ICD interrogation and started apixaban in 1/16.   Echo in 5/18 showed improvement in EF to 50-55% with mild LVH.   Patient was lost to followup for about 4 years and stopped all his medications, it appears. He was seen in the ER in 4/22 with dyspnea/CHF and was started on Lasix.  He was seen by primary care and started back on some of his CHF medications.  Echo in 6/22 showed EF down to 25-30% with mild LV dilation, global hypokinesis, mildly decreased RV systolic function.   He was referred by Dr. Pilar Plate back to CHF clinic.  He has been feeling better since starting back on Lasix, Coreg, spironolactone, and Jardiance.  He is short of breath walking up stairs or hills.  No dyspnea walking on flat ground.  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  He is able to do his job without significant dyspnea. No orthopnea/PND.  No lightheadedness.  BP mildly elevated today.  Still smoking 1 ppd.   Labs (5/18): K 4.5, creatinine 1.0 Labs (8/18): hgb 13.3, LDL 51, K 3.9, creatinine 2.27 Labs (10/18): K 4.4, creatinine 1.19 Labs (6/22): K 4.7, creatinine 1.4  ECG (personally reviewed): NSR, LAFB, inferior Qs  SH: No drugs as of 06/28/13. Quit smoking in 10/18. No ETOH.   FH: Mother: DM2, living        Father deceased, no issues  Review of systems complete and found to be negative unless listed in HPI.    PMH: 1) HTN 2) NICM: LHC (06/2013): Mild coronary obstructive disease with 30% narrowing in the mid AV groove circumflex, and 30% proximal and 40% mid RCA stenoses  with otherwise normal LAD and ramus intermediate vessel.  RHC (06/2013) RA: 6, RV: 26/9, PA: 26/13, PC: a 18 mean 13, CO/CI Fick: 3.7/2.0, left ventriculography EF 15%; ECHO (06/2013) EF 20%, diffuse HK, mild MR; TEE (07/2013) no thrombus; ECHO (10/2013): EF 20-25%, diff HK, LA mod dilated; [employer] ICD implanted (12/01/13).  Echo (4/16) with EF 30-35%, diffuse hypokinesis, normal RV.  - Echo (5/18) with EF 50-55%, mild LVH.  - Echo (6/22) with EF down to 25-30% with mild LV dilation, global hypokinesis, mildly decreased RV systolic function.  3) DM2 4) Stroke: MRI (07/2013) revealed an acute infarction, with a greater than 3 cm infarct involving left frontal opercular cortex and subcortical white matter. Carotid dopplers (07/2013) mild soft plaque origin ICA. 1-39% ICA stenosis. Vertebral artery flow is antegrade 5) Polysubstance abuse: + cocaine UDS in past.  6) Atrial fibrillation: Paroxysmal, started Eliquis in 1/16.  7) Nephrolithiasis with AKI   Current Outpatient Medications  Medication Sig Dispense Refill   apixaban (ELIQUIS) 5 MG TABS tablet Take 1 tablet (5 mg total) by mouth 2 (two) times daily. 180 tablet 3   atorvastatin (LIPITOR) 80 MG tablet Take 1 tablet (80 mg total) by mouth every evening. 90 tablet 3   Blood Glucose Monitoring Suppl (ACCU-CHEK AVIVA CONNECT) w/Device KIT 1 Units by Does not apply route daily.  1 kit 0   carvedilol (COREG) 25 MG tablet Take 1 tablet (25 mg total) by mouth 2 (two) times daily with a meal. 60 tablet 3   empagliflozin (JARDIANCE) 10 MG TABS tablet Take 10 mg by mouth daily. 90 tablet 3   furosemide (LASIX) 40 MG tablet Take 0.5 tablets (20 mg total) by mouth daily. 30 tablet 0   glucose blood (ACCU-CHEK AVIVA) test strip Use as instructed 100 each 12   insulin glargine (LANTUS SOLOSTAR) 100 UNIT/ML Solostar Pen Inject 30 Units into the skin daily. 15 mL 3   Insulin Syringes, Disposable, U-100 0.3 ML MISC 1 each by Does not apply route daily. 100  each 1   metFORMIN (GLUCOPHAGE) 1000 MG tablet Take 1,000 mg by mouth 2 (two) times daily with a meal.     ReliOn Ultra Thin Lancets MISC Patient will need to test his blood glucose once daily prior to breakfast and metformin and amaryl administration. 100 each 2   sacubitril-valsartan (ENTRESTO) 24-26 MG Take 1 tablet by mouth 2 (two) times daily. 60 tablet 11   tamsulosin (FLOMAX) 0.4 MG CAPS capsule Take 1 capsule (0.4 mg total) by mouth daily after breakfast. 10 capsule 0   UNIFINE PENTIPS 31G X 6 MM MISC USE AS DIRECTED WITH INSULIN 100 each 0   spironolactone (ALDACTONE) 25 MG tablet Take 1 tablet (25 mg total) by mouth daily. 60 tablet 3   No current facility-administered medications for this encounter.     Vitals:   09/19/20 1135  BP: 140/88  Pulse: 87  SpO2: 98%  Weight: 84.9 kg (187 lb 3.2 oz)   Wt Readings from Last 3 Encounters:  09/19/20 84.9 kg (187 lb 3.2 oz)  08/02/20 86 kg (189 lb 8 oz)  07/17/20 91.2 kg (201 lb)     PHYSICAL EXAM: General: NAD Neck: No JVD, no thyromegaly or thyroid nodule.  Lungs: Clear to auscultation bilaterally with normal respiratory effort. CV: Nondisplaced PMI.  Heart regular S1/S2, no S3/S4, no murmur.  No peripheral edema.  No carotid bruit.  Normal pedal pulses.  Abdomen: Soft, nontender, no hepatosplenomegaly, no distention.  Skin: Intact without lesions or rashes.  Neurologic: Alert and oriented x 3.  Psych: Normal affect. Extremities: No clubbing or cyanosis.  HEENT: Normal.   ASSESSMENT & PLAN:  1) Chronic systolic HF: Nonischemic cardiomyopathy s/p [employer] ICD. Narrow QRS so not CRT candidate. Echo 06/2014 showed 30-35% with diffuse hypokinesis. Echo in 5/18 with EF improved to 50-55%. He stopped all his meds and was lost to followup for about 4 years.  Echo in 6/22 showed EF significantly down to 25-30% with mild RV dysfunction.  NYHA class II symptoms, improved after restarting meds.  Not volume overloaded on exam.     - Continue coreg 25 mg BID.  - Continue spironolactone 25 mg daily.  - Start Entresto 24/26 bid. BMET/BNP today, BMET 10 days.  - Continue empagliflozin.  - Continue Lasix 20 mg every other day.  - With significant fall in EF and ongoing smoking/CHF risk factors, I will arrange for right and left heart cath to assess filling pressures/CO as well as to look for any obstructive coronary disease. Discussed risks/benefits with patient and he agrees to procedure.  2) Atrial fibrillation: Paroxysmal, NSR today.  - Continue Eliquis 5 mg BID.  3) OSA: Using CPAP.   4) Polysubstance abuse: Has been drug free since 06/2013.  5) Stroke: In 5/15, likely related to atrial fibrillation. Now on Eliquis.  6) Smoking: Still smoking 1 ppd.  I recommended cessation.  He is not interested in Chantix or Wellbutrin currently.    Followup with HF pharmacist in 3 wks for medication titration, see me in 6 wks.   Loralie Champagne, MD  09/19/2020

## 2020-09-20 ENCOUNTER — Other Ambulatory Visit: Payer: Self-pay

## 2020-09-20 NOTE — Telephone Encounter (Signed)
Sent request to PCP. Cherita Hebel, CMA  

## 2020-09-21 MED ORDER — LANTUS SOLOSTAR 100 UNIT/ML ~~LOC~~ SOPN: 30.0000 [IU] | PEN_INJECTOR | Freq: Every day | SUBCUTANEOUS | 3 refills | Status: DC

## 2020-09-22 ENCOUNTER — Ambulatory Visit (INDEPENDENT_AMBULATORY_CARE_PROVIDER_SITE_OTHER): Payer: 59 | Admitting: Family Medicine

## 2020-09-22 ENCOUNTER — Other Ambulatory Visit: Payer: Self-pay

## 2020-09-22 VITALS — BP 120/70 | HR 100 | Ht 65.0 in | Wt 187.0 lb

## 2020-09-22 DIAGNOSIS — E119 Type 2 diabetes mellitus without complications: Secondary | ICD-10-CM | POA: Diagnosis not present

## 2020-09-22 DIAGNOSIS — Z794 Long term (current) use of insulin: Secondary | ICD-10-CM

## 2020-09-22 DIAGNOSIS — F17200 Nicotine dependence, unspecified, uncomplicated: Secondary | ICD-10-CM | POA: Diagnosis not present

## 2020-09-22 MED ORDER — ATORVASTATIN CALCIUM 80 MG PO TABS: 80.0000 mg | ORAL_TABLET | Freq: Every evening | ORAL | 3 refills | Status: DC

## 2020-09-22 NOTE — Progress Notes (Signed)
    SUBJECTIVE:   CHIEF COMPLAINT / HPI:   Type 2 DM Patient reports he is overall doing well.  He does not have any concerns status with no hypoglycemic episodes.  He has been checking his fasting sugars, which are listed below.  States he has not yet received his refill on Lantus and needs refill on atorvastatin.     Tobacco use Patient reports he is still smoking half pack per day, he said about quitting but is not ready to at this time.    PERTINENT  PMH / PSH: Reviewed  OBJECTIVE:   BP 120/70   Pulse 100   Ht '5\' 5"'$  (1.651 m)   Wt 187 lb (84.8 kg)   SpO2 95%   BMI 31.12 kg/m   Gen: well-appearing, NAD CV: RRR, no m/r/g appreciated, no peripheral edema Pulm: CTAB, no wheezes/crackles GI: soft, non-tender, non-distended  ASSESSMENT/PLAN:   Long-term insulin use in type 2 diabetes (HCC) Fasting blood sugars overall doing well with some outliers.  Refill for Lantus sent on 7/21. - Refill atorvastatin 80 mg daily - Continue Lantus 30 units, metformin 1000 mg twice daily - Follow-up for A1c in 1 to 2 months - Foot exam at next appointment - We will obtain records from ophthalmology  Smoking Patient not yet interested in smoking cessation, in the precontemplative state. -We will continue cessation counseling     Yahye Siebert, [place]

## 2020-09-22 NOTE — Assessment & Plan Note (Addendum)
Patient not yet interested in smoking cessation, in the precontemplative state. -We will continue cessation counseling

## 2020-09-22 NOTE — Patient Instructions (Addendum)
We have sent in refills for your Lantus and atorvastatin.  Please let our office know if you have any issues with getting his medications.  He will be due for your next A1c in about a month.

## 2020-09-22 NOTE — Assessment & Plan Note (Addendum)
Fasting blood sugars overall doing well with some outliers.  Refill for Lantus sent on 7/21. - Refill atorvastatin 80 mg daily - Continue Lantus 30 units, metformin 1000 mg twice daily - Follow-up for A1c in 1 to 2 months - Foot exam at next appointment - We will obtain records from ophthalmology

## 2020-09-25 ENCOUNTER — Other Ambulatory Visit (HOSPITAL_COMMUNITY): Payer: Self-pay

## 2020-09-25 DIAGNOSIS — I5022 Chronic systolic (congestive) heart failure: Secondary | ICD-10-CM

## 2020-09-25 MED ORDER — LANTUS SOLOSTAR 100 UNIT/ML ~~LOC~~ SOPN: 30.0000 [IU] | PEN_INJECTOR | Freq: Every day | SUBCUTANEOUS | 3 refills | Status: DC

## 2020-09-25 MED ORDER — SODIUM CHLORIDE 0.9% FLUSH: 3.0000 mL | Freq: Two times a day (BID) | INTRAVENOUS | Status: AC

## 2020-09-25 NOTE — Addendum Note (Signed)
Addended by: Talbot Grumbling on: 09/25/2020 04:26 PM   Modules accepted: Orders

## 2020-09-25 NOTE — Telephone Encounter (Signed)
Patient calls nurse line regarding issues with lantus rx. Called patient. Patient requesting that prescription be sent to [facility] on [address]. Called and canceled rx at [facility]. Resent to [facility] on [address].   Talbot Grumbling, RN

## 2020-09-27 NOTE — Telephone Encounter (Signed)
Advanced Heart Failure Patient Advocate Encounter  Prior Authorization for Joseph [name] has been approved.    PA# [id number] Effective dates: 09/19/20 through 09/19/21  Charlann Boxer, CPhT

## 2020-09-29 ENCOUNTER — Encounter (HOSPITAL_COMMUNITY)
Admission: RE | Disposition: A | Discharge status: Home / Self Care | Payer: Self-pay | Source: Home / Self Care | Attending: Cardiology

## 2020-09-29 ENCOUNTER — Ambulatory Visit (HOSPITAL_COMMUNITY)
Admission: RE | Admit: 2020-09-29 | Discharge: 2020-09-29 | Disposition: A | Discharge status: Home / Self Care | Payer: 59 | Attending: Cardiology | Admitting: Cardiology

## 2020-09-29 ENCOUNTER — Other Ambulatory Visit (HOSPITAL_COMMUNITY): Payer: Self-pay

## 2020-09-29 DIAGNOSIS — I428 Other cardiomyopathies: Secondary | ICD-10-CM | POA: Insufficient documentation

## 2020-09-29 DIAGNOSIS — Z794 Long term (current) use of insulin: Secondary | ICD-10-CM | POA: Diagnosis not present

## 2020-09-29 DIAGNOSIS — I480 Paroxysmal atrial fibrillation: Secondary | ICD-10-CM | POA: Insufficient documentation

## 2020-09-29 DIAGNOSIS — Z955 Presence of coronary angioplasty implant and graft: Secondary | ICD-10-CM | POA: Diagnosis not present

## 2020-09-29 DIAGNOSIS — I2510 Atherosclerotic heart disease of native coronary artery without angina pectoris: Secondary | ICD-10-CM | POA: Diagnosis not present

## 2020-09-29 DIAGNOSIS — Z79899 Other long term (current) drug therapy: Secondary | ICD-10-CM | POA: Insufficient documentation

## 2020-09-29 DIAGNOSIS — E119 Type 2 diabetes mellitus without complications: Secondary | ICD-10-CM | POA: Diagnosis not present

## 2020-09-29 DIAGNOSIS — G4733 Obstructive sleep apnea (adult) (pediatric): Secondary | ICD-10-CM | POA: Diagnosis not present

## 2020-09-29 DIAGNOSIS — F1910 Other psychoactive substance abuse, uncomplicated: Secondary | ICD-10-CM | POA: Diagnosis not present

## 2020-09-29 DIAGNOSIS — Z7901 Long term (current) use of anticoagulants: Secondary | ICD-10-CM | POA: Insufficient documentation

## 2020-09-29 DIAGNOSIS — Z7984 Long term (current) use of oral hypoglycemic drugs: Secondary | ICD-10-CM | POA: Insufficient documentation

## 2020-09-29 DIAGNOSIS — I110 Hypertensive heart disease with heart failure: Secondary | ICD-10-CM | POA: Diagnosis present

## 2020-09-29 DIAGNOSIS — F1410 Cocaine abuse, uncomplicated: Secondary | ICD-10-CM | POA: Insufficient documentation

## 2020-09-29 DIAGNOSIS — I429 Cardiomyopathy, unspecified: Secondary | ICD-10-CM | POA: Diagnosis not present

## 2020-09-29 DIAGNOSIS — F17210 Nicotine dependence, cigarettes, uncomplicated: Secondary | ICD-10-CM | POA: Diagnosis not present

## 2020-09-29 DIAGNOSIS — I5022 Chronic systolic (congestive) heart failure: Secondary | ICD-10-CM | POA: Insufficient documentation

## 2020-09-29 DIAGNOSIS — I5020 Unspecified systolic (congestive) heart failure: Secondary | ICD-10-CM | POA: Diagnosis present

## 2020-09-29 HISTORY — PX: CORONARY STENT INTERVENTION: CATH118234

## 2020-09-29 HISTORY — PX: RIGHT/LEFT HEART CATH AND CORONARY ANGIOGRAPHY: CATH118266

## 2020-09-29 LAB — POCT I-STAT EG7
Acid-base deficit: 5 mmol/L — ABNORMAL HIGH (ref 0.0–2.0)
Acid-base deficit: 6 mmol/L — ABNORMAL HIGH (ref 0.0–2.0)
Bicarbonate: 20.3 mmol/L (ref 20.0–28.0)
Bicarbonate: 21.4 mmol/L (ref 20.0–28.0)
Calcium, Ion: 1.2 mmol/L (ref 1.15–1.40)
Calcium, Ion: 1.33 mmol/L (ref 1.15–1.40)
HCT: 46 % (ref 39.0–52.0)
HCT: 49 % (ref 39.0–52.0)
Hemoglobin: 15.6 g/dL (ref 13.0–17.0)
Hemoglobin: 16.7 g/dL (ref 13.0–17.0)
O2 Saturation: 70 %
O2 Saturation: 71 %
Potassium: 4.1 mmol/L (ref 3.5–5.1)
Potassium: 4.5 mmol/L (ref 3.5–5.1)
Sodium: 142 mmol/L (ref 135–145)
Sodium: 143 mmol/L (ref 135–145)
TCO2: 22 mmol/L (ref 22–32)
TCO2: 23 mmol/L (ref 22–32)
pCO2, Ven: 41.7 mmHg — ABNORMAL LOW (ref 44.0–60.0)
pCO2, Ven: 43.8 mmHg — ABNORMAL LOW (ref 44.0–60.0)
pH, Ven: 7.295 (ref 7.250–7.430)
pH, Ven: 7.297 (ref 7.250–7.430)
pO2, Ven: 40 mmHg (ref 32.0–45.0)
pO2, Ven: 41 mmHg (ref 32.0–45.0)

## 2020-09-29 LAB — POCT ACTIVATED CLOTTING TIME
Activated Clotting Time: 254 seconds
Activated Clotting Time: 358 seconds
Activated Clotting Time: 237 seconds
Activated Clotting Time: 190 seconds

## 2020-09-29 LAB — GLUCOSE, CAPILLARY
Glucose-Capillary: 99 mg/dL (ref 70–99)
Glucose-Capillary: 77 mg/dL (ref 70–99)

## 2020-09-29 SURGERY — RIGHT/LEFT HEART CATH AND CORONARY ANGIOGRAPHY
Anesthesia: LOCAL

## 2020-09-29 MED ORDER — SODIUM CHLORIDE 0.9% FLUSH: 3.0000 mL | Freq: Two times a day (BID) | INTRAVENOUS | Status: DC

## 2020-09-29 MED ORDER — ASPIRIN 81 MG PO CHEW: 81.0000 mg | CHEWABLE_TABLET | ORAL | Status: DC

## 2020-09-29 MED ORDER — HEPARIN (PORCINE) IN NACL 1000-0.9 UT/500ML-% IV SOLN
INTRAVENOUS | Status: AC
  Filled 2020-09-29: qty 1000

## 2020-09-29 MED ORDER — CLOPIDOGREL BISULFATE 300 MG PO TABS
ORAL_TABLET | ORAL | Status: AC
  Filled 2020-09-29: qty 2

## 2020-09-29 MED ORDER — FENTANYL CITRATE (PF) 100 MCG/2ML IJ SOLN
INTRAMUSCULAR | Status: AC
  Filled 2020-09-29: qty 2

## 2020-09-29 MED ORDER — ACETAMINOPHEN 325 MG PO TABS: 650.0000 mg | ORAL_TABLET | ORAL | Status: DC | PRN

## 2020-09-29 MED ORDER — CLOPIDOGREL BISULFATE 75 MG PO TABS
75.0000 mg | ORAL_TABLET | Freq: Every day | ORAL | 0 refills | Status: DC
  Filled 2020-09-29: qty 30, 30d supply, fill #0

## 2020-09-29 MED ORDER — LIDOCAINE HCL (PF) 1 % IJ SOLN
INTRAMUSCULAR | Status: AC
  Filled 2020-09-29: qty 30

## 2020-09-29 MED ORDER — NITROGLYCERIN 0.4 MG SL SUBL: 0.4000 mg | SUBLINGUAL_TABLET | SUBLINGUAL | 3 refills | Status: DC | PRN

## 2020-09-29 MED ORDER — HYDRALAZINE HCL 20 MG/ML IJ SOLN: 10.0000 mg | INTRAMUSCULAR | Status: DC | PRN

## 2020-09-29 MED ORDER — CLOPIDOGREL BISULFATE 300 MG PO TABS
ORAL_TABLET | ORAL | Status: DC | PRN
  Administered 2020-09-29: 600 mg via ORAL

## 2020-09-29 MED ORDER — HEPARIN (PORCINE) IN NACL 1000-0.9 UT/500ML-% IV SOLN
INTRAVENOUS | Status: DC | PRN
  Administered 2020-09-29 (×2): 500 mL

## 2020-09-29 MED ORDER — MIDAZOLAM HCL 2 MG/2ML IJ SOLN
INTRAMUSCULAR | Status: AC
  Filled 2020-09-29: qty 2

## 2020-09-29 MED ORDER — NITROGLYCERIN 1 MG/10 ML FOR IR/CATH LAB
INTRA_ARTERIAL | Status: DC | PRN
  Administered 2020-09-29: 150 ug via INTRACORONARY

## 2020-09-29 MED ORDER — MIDAZOLAM HCL 2 MG/2ML IJ SOLN
INTRAMUSCULAR | Status: DC | PRN
  Administered 2020-09-29: 1 mg via INTRAVENOUS

## 2020-09-29 MED ORDER — HEPARIN SODIUM (PORCINE) 1000 UNIT/ML IJ SOLN
INTRAMUSCULAR | Status: DC | PRN
Start: 2020-09-29 — End: 2020-09-29
  Administered 2020-09-29: 4000 [IU] via INTRAVENOUS
  Administered 2020-09-29: 5000 [IU] via INTRAVENOUS

## 2020-09-29 MED ORDER — SODIUM CHLORIDE 0.9 % IV SOLN: 250.0000 mL | INTRAVENOUS | Status: DC | PRN

## 2020-09-29 MED ORDER — ONDANSETRON HCL 4 MG/2ML IJ SOLN: 4.0000 mg | Freq: Four times a day (QID) | INTRAMUSCULAR | Status: DC | PRN

## 2020-09-29 MED ORDER — SODIUM CHLORIDE 0.9 % IV SOLN: INTRAVENOUS | Status: AC

## 2020-09-29 MED ORDER — CLOPIDOGREL BISULFATE 75 MG PO TABS: 75.0000 mg | ORAL_TABLET | Freq: Every day | ORAL | Status: DC

## 2020-09-29 MED ORDER — CLOPIDOGREL BISULFATE 75 MG PO TABS: 75.0000 mg | ORAL_TABLET | Freq: Every day | ORAL | 0 refills | Status: DC

## 2020-09-29 MED ORDER — HEPARIN SODIUM (PORCINE) 1000 UNIT/ML IJ SOLN
INTRAMUSCULAR | Status: AC
  Filled 2020-09-29: qty 1

## 2020-09-29 MED ORDER — FENTANYL CITRATE (PF) 100 MCG/2ML IJ SOLN
INTRAMUSCULAR | Status: DC | PRN
  Administered 2020-09-29: 25 ug via INTRAVENOUS

## 2020-09-29 MED ORDER — SODIUM CHLORIDE 0.9% FLUSH: 3.0000 mL | INTRAVENOUS | Status: DC | PRN

## 2020-09-29 MED ORDER — HEPARIN SODIUM (PORCINE) 1000 UNIT/ML IJ SOLN
INTRAMUSCULAR | Status: DC | PRN
  Administered 2020-09-29: 4500 [IU] via INTRAVENOUS

## 2020-09-29 MED ORDER — LABETALOL HCL 5 MG/ML IV SOLN: 10.0000 mg | INTRAVENOUS | Status: DC | PRN

## 2020-09-29 MED ORDER — VERAPAMIL HCL 2.5 MG/ML IV SOLN
INTRAVENOUS | Status: AC
  Filled 2020-09-29: qty 2

## 2020-09-29 MED ORDER — NITROGLYCERIN 1 MG/10 ML FOR IR/CATH LAB
INTRA_ARTERIAL | Status: AC
  Filled 2020-09-29: qty 10

## 2020-09-29 MED ORDER — IOHEXOL 350 MG/ML SOLN
INTRAVENOUS | Status: DC | PRN
  Administered 2020-09-29: 120 mL

## 2020-09-29 MED ORDER — LIDOCAINE HCL (PF) 1 % IJ SOLN
INTRAMUSCULAR | Status: DC | PRN
  Administered 2020-09-29 (×2): 2 mL

## 2020-09-29 MED ORDER — SODIUM CHLORIDE 0.9 % IV SOLN: INTRAVENOUS | Status: DC

## 2020-09-29 MED ORDER — VERAPAMIL HCL 2.5 MG/ML IV SOLN
INTRAVENOUS | Status: DC | PRN
  Administered 2020-09-29: 10 mL via INTRA_ARTERIAL

## 2020-09-29 SURGICAL SUPPLY — 26 items
BALL SAPPHIRE NC24 4.0X26 (BALLOONS) ×2
BALLN SAPPHIRE 3.0X20 (BALLOONS) ×2
BALLN SCOREFLEX 3.50X15 (BALLOONS) ×2
BALLN WOLVERINE 3.50X10 (BALLOONS) ×2
BALLOON SAPPHIRE 3.0X20 (BALLOONS) ×1 IMPLANT
BALLOON SAPPHIRE NC24 4.0X26 (BALLOONS) ×1 IMPLANT
BALLOON SCOREFLEX 3.50X15 (BALLOONS) ×1 IMPLANT
BALLOON WOLVERINE 3.50X10 (BALLOONS) ×1 IMPLANT
CATH 5FR JL3.5 JR4 ANG PIG MP (CATHETERS) ×2 IMPLANT
CATH BALLN WEDGE 5F 110CM (CATHETERS) ×2 IMPLANT
CATH INFINITI 5 FR 3DRC (CATHETERS) ×2 IMPLANT
CATH VISTA GUIDE 6FR AL1 (CATHETERS) ×2 IMPLANT
DEVICE RAD COMP TR BAND LRG (VASCULAR PRODUCTS) ×2 IMPLANT
GLIDESHEATH SLEND SS 6F .021 (SHEATH) ×2 IMPLANT
GUIDEWIRE ANGLED .035X150CM (WIRE) ×2 IMPLANT
GUIDEWIRE INQWIRE 1.5J.035X260 (WIRE) ×1 IMPLANT
INQWIRE 1.5J .035X260CM (WIRE) ×2
KIT ENCORE 26 ADVANTAGE (KITS) ×2 IMPLANT
KIT HEART LEFT (KITS) ×2 IMPLANT
KIT HEMO VALVE WATCHDOG (MISCELLANEOUS) ×2 IMPLANT
PACK CARDIAC CATHETERIZATION (CUSTOM PROCEDURE TRAY) ×2 IMPLANT
SHEATH GLIDE SLENDER 4/5FR (SHEATH) ×2 IMPLANT
STENT ONYX FRONTIER 3.5X26 (Permanent Stent) ×2 IMPLANT
STENT ONYX FRONTIER 3.5X34 (Permanent Stent) ×2 IMPLANT
TRANSDUCER W/STOPCOCK (MISCELLANEOUS) ×2 IMPLANT
WIRE COUGAR XT STRL 190CM (WIRE) ×2 IMPLANT

## 2020-09-29 NOTE — Discharge Instructions (Signed)

## 2020-09-29 NOTE — Progress Notes (Signed)
Site area: rt brachial veinous sheath Site Prior to Removal:  Level 0 Pressure Applied For: 15 minutes Manual:   yes Patient Status During Pull:  stable Post Pull Site:  Level 0 Post Pull Instructions Given:  yes Post Pull Pulses Present: rt radial palpable Dressing Applied:  gauze and tegaderm Bedrest begins @ NA Comments:

## 2020-09-29 NOTE — Discharge Summary (Signed)
Discharge Summary for Same Day PCI   Patient ID: ALP GOLDWATER MRN: [id number]; DOB: [birth year]  Admit date: 09/29/2020 Discharge date: 09/29/2020  Primary Care Provider: Rise Patience, DO  Primary Cardiologist: Loralie Champagne, MD  Primary Electrophysiologist:  None   Discharge Diagnoses    Principal Problem:   HFrEF (heart failure with reduced ejection fraction) [facility]) Active Problems:   CAD (coronary artery disease)   Diagnostic Studies/Procedures    Cardiac Catheterization 09/29/2020:    Prox RCA-1 lesion is 60% stenosed.   Prox RCA-2 lesion is 80% stenosed.   Mid RCA lesion is 70% stenosed.   1st Mrg lesion is 70% stenosed.   Prox LAD lesion is 30% stenosed.   1st Diag lesion is 50% stenosed.   1. Normal filling pressures. 2. Low but not markedly low cardiac output. 3. Severe disease in the RCA.  This does not explain significant fall in his LV EF by echo, but certainly could be the cause of his exertional fatigue/dyspnea given normal filling pressures.  Discussed with Dr. Burt Knack, will plan PCI to RCA.  He will need to resume Eliquis, so will use Plavix and short course of ASA 81.  Diagnostic Dominance: Right   Coronary stent intervention 09/29/20:   Prox RCA lesion is 75% stenosed.   Prox RCA to Mid RCA lesion is 80% stenosed.   A drug-eluting stent was successfully placed using a STENT ONYX FRONTIER 3.5X26.   A drug-eluting stent was successfully placed using a STENT ONYX FRONTIER 3.5X34.   Post intervention, there is a 0% residual stenosis.   Post intervention, there is a 0% residual stenosis.   Successful PCI of sequential lesions in the proximal and mid RCA using overlapping DES (3.5x34 mm Resolute Onyx and 3.5 x 26 mm Resolute Onyx   Recommend: Clopidogrel 75 mg daily x 6 months Resume apixaban 5 mg BID tomorrow OK to transition clopidogrel--->ASA 81 mg daily at 6 months  Diagnostic Dominance: Right    Intervention     _____________   History of  Present Illness     Joseph [name] is a 55 y.o. male with history of chronic systolic HF, nonischemic cardiomyopathy, HTN, TIA, stroke, DM2, paroxysmal atrial fibrillation, tobacco abuse and polysubstance abuse.   Patient had atrial fibrillation noted on ICD interrogation and started apixaban in 1/16.    Echo in 5/18 showed improvement in EF to 50-55% with mild LVH.   Patient was lost to followup for about 4 years and stopped all his medications. He was seen in the ER in 4/22 with dyspnea/CHF and was started on Lasix.  He was seen by primary care and started back on some of his CHF medications.  Echo in 6/22 showed EF down to 25-30% with mild LV dilation, global hypokinesis, mildly decreased RV systolic function.   He was referred by Dr. Pilar Plate back to CHF clinic.  He had been feeling better since starting back on Lasix, Coreg, spironolactone, and Jardiance.  He was short of breath walking up stairs or hills.  No dyspnea walking on flat ground.  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  He was able to do his job without significant dyspnea. No orthopnea/PND.  No lightheadedness. Still smoking 1 ppd.   Given symptoms an outpatient cardiac catheterization was arranged for further evaluation.  Hospital Course     The patient underwent cardiac cath with Dr. Aundra Dubin as noted above with severe disease in the  p/mRCA. It was not felt to the cause of significant drop in EF, but possibly the etiology of fatigue/dyspnea. Dr. Burt Knack assisted with PCI/DESx2.  Plan for monotherapy with plavix given the need for eliquis for at least 6 months. Ok to transition from plavix to ASA 27m daily after 6 months. Does have concomitant disease of 70% 1st OM, 50% 1st Diag to be treated medically.There were no observed complications post cath. Radial cath site was re-evaluated prior to discharge and found to be stable without any complications. Instructions/precautions regarding cath site care  were given prior to discharge. CR phase II order was done prior to discharge [facility]   EMarijo Saneswas seen by Dr. CBurt Knackand determined stable for discharge home. Follow up with our office has been arranged. Medications are listed below. Pertinent changes include addition of plavix.  _____________  Cath/PCI Registry Performance & Quality Measures: Aspirin prescribed? - No - monotherapy given need for Eliquis  ADP Receptor Inhibitor (Plavix/Clopidogrel, Brilinta/Ticagrelor or Effient/Prasugrel) prescribed (includes medically managed patients)? - Yes High Intensity Statin (Lipitor 40-870mor Crestor 20-4018mprescribed? - Yes For EF <40%, was ACEI/ARB prescribed? - Yes For EF <40%, Aldosterone Antagonist (Spironolactone or Eplerenone) prescribed? - Yes Cardiac Rehab Phase II ordered (Included Medically managed Patients)? - Yes Ordered by provider at discharge.  _____________   Discharge Vitals Blood pressure 121/90, pulse 87, temperature 98.7 F (37.1 C), temperature source Oral, resp. rate 19, height _0  (1.651 m), weight 84.4 kg, SpO2 99 %.  Filed Weights   09/29/20 1058  Weight: 84.4 kg    Last Labs & Radiologic Studies    CBC Recent Labs    09/29/20 1237  HGB 16.7  15.6  HCT 49.0  46.37.8Basic Metabolic Panel Recent Labs    09/29/20 1237  NA 142  143  K 4.5  4.1   Liver Function Tests No results for input(s): AST, ALT, ALKPHOS, BILITOT, PROT, ALBUMIN in the last 72 hours. No results for input(s): LIPASE, AMYLASE in the last 72 hours. High Sensitivity Troponin:   No results for input(s): TROPONINIHS in the last 720 hours.  BNP Invalid input(s): POCBNP D-Dimer No results for input(s): DDIMER in the last 72 hours. Hemoglobin A1C No results for input(s): HGBA1C in the last 72 hours. Fasting Lipid Panel No results for input(s): CHOL, HDL, LDLCALC, TRIG, CHOLHDL, LDLDIRECT in the last 72 hours. Thyroid Function Tests No  results for input(s): TSH, T4TOTAL, T3FREE, THYROIDAB in the last 72 hours.  Invalid input(s): FREET3 _____________  CARDIAC CATHETERIZATION  Result Date: 09/29/2020 Formatting of this result is different from the original.   Prox RCA-1 lesion is 60% stenosed.   Prox RCA-2 lesion is 80% stenosed.   Mid RCA lesion is 70% stenosed.   1st Mrg lesion is 70% stenosed.   Prox LAD lesion is 30% stenosed.   1st Diag lesion is 50% stenosed. 1. Normal filling pressures. 2. Low but not markedly low cardiac output. 3. Severe disease in the RCA.  This does not explain significant fall in his LV EF by echo, but certainly could be the cause of his exertional fatigue/dyspnea given normal filling pressures.  Discussed with Dr. CooBurt Knackill plan PCI to RCA.  He will need to resume Eliquis, so will use Plavix and short course of ASA 81.   CARDIAC CATHETERIZATION  Result Date: 09/29/2020 Formatting of this result is different from the original.   Prox RCA lesion  is 75% stenosed.   Prox RCA to Mid RCA lesion is 80% stenosed.   A drug-eluting stent was successfully placed using a STENT ONYX FRONTIER 3.5X26.   A drug-eluting stent was successfully placed using a STENT ONYX FRONTIER 3.5X34.   Post intervention, there is a 0% residual stenosis.   Post intervention, there is a 0% residual stenosis. Successful PCI of sequential lesions in the proximal and mid RCA using overlapping DES (3.5x34 mm Resolute Onyx and 3.5 x 26 mm Resolute Onyx Recommend: Clopidogrel 75 mg daily x 6 months Resume apixaban 5 mg BID tomorrow OK to transition clopidogrel--->ASA 81 mg daily at 6 months    Disposition   Pt is being discharged home today in good condition.  Follow-up Plans & Appointments     Follow-up Information     Larey Dresser, MD Follow up.   Specialty: Cardiology Why: Office will call you with a follow up appt. Contact information: [place] [address] [phone]                Discharge  Instructions     AMB Referral to Cardiac Rehabilitation - Phase II   Complete by: As directed    Diagnosis: Coronary Stents   After initial evaluation and assessments completed: Virtual Based Care may be provided alone or in conjunction with Phase 2 Cardiac Rehab based on patient barriers.: Yes        Discharge Medications   Allergies as of 09/29/2020       Reactions   Shellfish Allergy Hives   Tape Rash   Occurred with Nicotine patch - limited rash.         Medication List     TAKE these medications    Accu-Chek Aviva Connect w/Device Kit 1 Units by Does not apply route daily.   atorvastatin 80 MG tablet Commonly known as: LIPITOR Take 1 tablet (80 mg total) by mouth every evening.   carvedilol 25 MG tablet Commonly known as: COREG Take 1 tablet (25 mg total) by mouth 2 (two) times daily with a meal.   clopidogrel 75 MG tablet Commonly known as: Plavix Take 1 tablet (75 mg total) by mouth daily.   Eliquis 5 MG Tabs tablet Generic drug: apixaban Take 1 tablet (5 mg total) by mouth 2 (two) times daily.   Entresto 24-26 MG Generic drug: sacubitril-valsartan Take 1 tablet by mouth 2 (two) times daily.   glucose blood test strip Commonly known as: Accu-Chek Aviva Use as instructed   Insulin Syringes (Disposable) U-100 0.3 ML Misc 1 each by Does not apply route daily.   Jardiance 10 MG Tabs tablet Generic drug: empagliflozin Take 10 mg by mouth daily.   Lantus SoloStar 100 UNIT/ML Solostar Pen Generic drug: insulin glargine Inject 30 Units into the skin daily.   metFORMIN 1000 MG tablet Commonly known as: GLUCOPHAGE Take 1,000 mg by mouth 2 (two) times daily with a meal.   nitroGLYCERIN 0.4 MG SL tablet Commonly known as: Nitrostat Place 1 tablet (0.4 mg total) under the tongue every 5 (five) minutes as needed for chest pain.   ReliOn Ultra Thin Lancets Misc Patient will need to test his blood glucose once daily prior to breakfast and metformin and  amaryl administration.   spironolactone 25 MG tablet Commonly known as: ALDACTONE Take 1 tablet (25 mg total) by mouth daily.   Unifine Pentips 31G X 6 MM Misc Generic drug: Insulin Pen Needle USE AS DIRECTED WITH INSULIN  ASK your doctor about these medications    furosemide 40 MG tablet Commonly known as: LASIX Take 0.5 tablets (20 mg total) by mouth daily.         Allergies Allergies  Allergen Reactions   Shellfish Allergy Hives   Tape Rash    Occurred with Nicotine patch - limited rash.     Outstanding Labs/Studies   N/a   Duration of Discharge Encounter   Greater than 30 minutes including physician time.  Signed, Abigail Butts, PA-C 09/29/2020, 7:30 PM

## 2020-09-29 NOTE — Interval H&P Note (Signed)
History and Physical Interval Note:  09/29/2020 12:24 PM  Joseph [name]  has presented today for surgery, with the diagnosis of heart failure.  The various methods of treatment have been discussed with the patient and family. After consideration of risks, benefits and other options for treatment, the patient has consented to  Procedure(s): RIGHT/LEFT HEART CATH AND CORONARY ANGIOGRAPHY (N/A) as a surgical intervention.  The patient's history has been reviewed, patient examined, no change in status, stable for surgery.  I have reviewed the patient's chart and labs.  Questions were answered to the patient's satisfaction.     Ausha Sieh [employer]

## 2020-10-02 ENCOUNTER — Telehealth (HOSPITAL_COMMUNITY): Payer: Self-pay | Admitting: *Deleted

## 2020-10-02 ENCOUNTER — Encounter (HOSPITAL_COMMUNITY): Payer: Self-pay | Admitting: Cardiology

## 2020-10-03 ENCOUNTER — Other Ambulatory Visit (HOSPITAL_COMMUNITY): Payer: Self-pay

## 2020-10-03 ENCOUNTER — Telehealth (HOSPITAL_COMMUNITY): Payer: Self-pay

## 2020-10-03 NOTE — Telephone Encounter (Signed)
Pharmacy Transitions of Care Follow-up Telephone Call  Date of discharge: 09/29/20  Discharge Diagnosis: HFrEF  How have you been since you were released from the hospital?  Patient had questions about when he could return to work and was directed to his cardiologist. No questions about meds at this time. Feeling better since discharge.  Medication changes made at discharge: START taking: clopidogrel (Plavix)  nitroGLYCERIN (Nitrostat)   Medication changes verified by the patient? Yes    Medication Accessibility:  Home Pharmacy: Not discussed   Was the patient provided with refills on discharged medications? No   Have all prescriptions been transferred from [facility] to home pharmacy? N/A   Is the patient able to afford medications? Patient has [place] Probation officer    Medication Review:  CLOPIDOGREL (PLAVIX) Clopidogrel 75 mg once daily.  - Educated patient on expected duration of therapy of ASA '81mg'$  with clopidogrel. - Advised patient of medications to avoid (NSAIDs, ASA)  - Educated that Tylenol (acetaminophen) will be the preferred analgesic to prevent risk of bleeding  - Emphasized importance of monitoring for signs and symptoms of bleeding (abnormal bruising, prolonged bleeding, nose bleeds, bleeding from gums, discolored urine, black tarry stools)  - Advised patient to alert all providers of anticoagulation therapy prior to starting a new medication or having a procedure   Follow-up Appointments:  PCP Hospital f/u appt confirmed?  None currently scheduled  Specialist Hospital f/u appt confirmed? Scheduled to see Dr. Aundra Dubin on 10/18/20 & 11/08/20 @ Cardiology.   If their condition worsens, is the pt aware to call PCP or go to the Emergency Dept.? Yes  Final Patient Assessment: Patient has follow up scheduled and knows to get refills at that follow up.

## 2020-10-11 ENCOUNTER — Telehealth (HOSPITAL_COMMUNITY): Payer: Self-pay

## 2020-10-11 NOTE — Telephone Encounter (Signed)
Attempted to call patient in regards to Cardiac Rehab - LM on VM 

## 2020-10-11 NOTE — Telephone Encounter (Signed)
Pt insurance is active and benefits verified through [facility]. Co-pay $50.00, DED $5,000.00/$5,000.00 met, out of pocket $8,150.00/$5,840.76 met, co-insurance 0%. No pre-authorization required. Passport, 10/11/20 @ 10:55AM, [phone]   Will contact patient to see if he is interested in the Cardiac Rehab Program. If interested, patient will need to complete follow up appt. Once completed, patient will be contacted for scheduling upon review by the RN Navigator.

## 2020-10-17 NOTE — Progress Notes (Signed)
Patient ID: Joseph [name], male   DOB: Dec 05, 1965, 55 y.o.   MRN: [id number]     Advanced Heart Failure Clinic Note   PCP: Dr. Pilar Plate EP: Dr Caryl Comes  HF: Dr. Aundra Dubin   HPI: Joseph [name] is a 55 y.o. male with history of chronic systolic HF, nonischemic cardiomyopathy, HTN, TIA, stroke, DM2, paroxysmal atrial fibrillation, tobacco abuse and polysubstance abuse.   Patient had atrial fibrillation noted on ICD interrogation and started apixaban in 1/16.   Echo in 5/18 showed improvement in EF to 50-55% with mild LVH.   Patient was lost to followup for about 4 years and stopped all his medications, it appears. He was seen in the ER in 4/22 with dyspnea/CHF and was started on Lasix.  He was seen by primary care and started back on some of his CHF medications.  Echo in 6/22 showed EF down to 25-30% with mild LV dilation, global hypokinesis, mildly decreased RV systolic function.   He was referred by Dr. Pilar Plate back to CHF clinic.  He has been feeling better since starting back on Lasix, Coreg, spironolactone, and Jardiance.  He is short of breath walking up stairs or hills.  No dyspnea walking on flat ground.  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  He is able to do his job without significant dyspnea. No orthopnea/PND.  No lightheadedness.  BP mildly elevated today.  Still smoking 1 ppd.   He underwent [facility] 09/29/20 revealing normal filling pressures, low cardiac output but not markedly low, severe disease in RCA. Dr. Burt Knack assisted with PCI/DESx2.  Plan for monotherapy with plavix given the need for eliquis for at least 6 months. Ok to transition from plavix to ASA 36m daily after 6 months. Does have concomitant disease of 70% 1st OM, 50% 1st Diag to be treated medically. He has been referred to Cardiac Rehab.  Today he returns for post- PCI and HF follow up. Went back to work last week full time. Overall feeling fine. Denies increasing SOB, CP, dizziness, edema, or  PND/Orthopnea. Appetite ok. No fever or chills. Weight at home 187 pounds. Taking all medications. He has been referred to cardiac rehab. Smoking 1/2 ppd. Not wearing CPAP.  Labs (5/18): K 4.5, creatinine 1.0 Labs (8/18): hgb 13.3, LDL 51, K 3.9, creatinine 2.27 Labs (10/18): K 4.4, creatinine 1.19 Labs (6/22): K 4.7, creatinine 1.4 Labs (7/22): K 4.1, creatinine 1.27  ECG (personally reviewed): NSR qrs 94 ms qtc 469 ms  SH: No drugs as of 06/28/13. Quit smoking in 10/18. No ETOH.   FH: Mother: DM2, living        Father deceased, no issues  Review of systems complete and found to be negative unless listed in HPI.    PMH: 1) HTN 2) NICM: LHC (06/2013): Mild coronary obstructive disease with 30% narrowing in the mid AV groove circumflex, and 30% proximal and 40% mid RCA stenoses with otherwise normal LAD and ramus intermediate vessel.  RHC (06/2013) RA: 6, RV: 26/9, PA: 26/13, PC: a 18 mean 13, CO/CI Fick: 3.7/2.0, left ventriculography EF 15%; ECHO (06/2013) EF 20%, diffuse HK, mild MR; TEE (07/2013) no thrombus; ECHO (10/2013): EF 20-25%, diff HK, LA mod dilated; [employer] ICD implanted (12/01/13).  Echo (4/16) with EF 30-35%, diffuse hypokinesis, normal RV.  - Echo (5/18) with EF 50-55%, mild LVH.  - Echo (6/22) with EF down to 25-30% with mild LV dilation, global hypokinesis, mildly  decreased RV systolic function.  - R/LHC (7/22): Normal filling pressures, low cardiac output, severe RCA disease. RA mean 1, RV 40/3, PA 42/18, mean 27, PCWP mean 7, Cardiac Output (Fick) 4.08, Cardiac Index (Fick) 2.13  3) DM2 4) Stroke: MRI (07/2013) revealed an acute infarction, with a greater than 3 cm infarct involving left frontal opercular cortex and subcortical white matter. Carotid dopplers (07/2013) mild soft plaque origin ICA. 1-39% ICA stenosis. Vertebral artery flow is antegrade 5) Polysubstance abuse: + cocaine UDS in past.  6) Atrial fibrillation: Paroxysmal, started Eliquis in 1/16.  7)  Nephrolithiasis with AKI  Current Outpatient Medications  Medication Sig Dispense Refill   apixaban (ELIQUIS) 5 MG TABS tablet Take 1 tablet (5 mg total) by mouth 2 (two) times daily. 180 tablet 3   atorvastatin (LIPITOR) 80 MG tablet Take 1 tablet (80 mg total) by mouth every evening. 90 tablet 3   Blood Glucose Monitoring Suppl (ACCU-CHEK AVIVA CONNECT) w/Device KIT 1 Units by Does not apply route daily. 1 kit 0   carvedilol (COREG) 25 MG tablet Take 1 tablet (25 mg total) by mouth 2 (two) times daily with a meal. 60 tablet 3   clopidogrel (PLAVIX) 75 MG tablet Take 1 tablet (75 mg total) by mouth daily. 30 tablet 0   empagliflozin (JARDIANCE) 10 MG TABS tablet Take 10 mg by mouth daily. 90 tablet 3   furosemide (LASIX) 40 MG tablet Take 0.5 tablets (20 mg total) by mouth daily. (Patient taking differently: Take 20 mg by mouth every other day.) 30 tablet 0   glucose blood (ACCU-CHEK AVIVA) test strip Use as instructed 100 each 12   insulin glargine (LANTUS SOLOSTAR) 100 UNIT/ML Solostar Pen Inject 30 Units into the skin daily. 15 mL 3   Insulin Syringes, Disposable, U-100 0.3 ML MISC 1 each by Does not apply route daily. 100 each 1   metFORMIN (GLUCOPHAGE) 1000 MG tablet Take 1,000 mg by mouth 2 (two) times daily with a meal.     nitroGLYCERIN (NITROSTAT) 0.4 MG SL tablet Place 1 tablet (0.4 mg total) under the tongue every 5 (five) minutes as needed for chest pain. 25 tablet 3   ReliOn Ultra Thin Lancets MISC Patient will need to test his blood glucose once daily prior to breakfast and metformin and amaryl administration. 100 each 2   sacubitril-valsartan (ENTRESTO) 24-26 MG Take 1 tablet by mouth 2 (two) times daily. 60 tablet 11   spironolactone (ALDACTONE) 25 MG tablet Take 1 tablet (25 mg total) by mouth daily. 60 tablet 3   UNIFINE PENTIPS 31G X 6 MM MISC USE AS DIRECTED WITH INSULIN 100 each 0   No current facility-administered medications for this encounter.   Facility-Administered  Medications Ordered in Other Encounters  Medication Dose Route Frequency Provider Last Rate Last Admin   sodium chloride flush (NS) 0.9 % injection 3 mL  3 mL Intravenous Q12H Larey Dresser, MD       BP 138/70   Pulse 86   Wt 84.9 kg (187 lb 3.2 oz)   SpO2 98%   BMI 31.15 kg/m   Wt Readings from Last 3 Encounters:  10/18/20 84.9 kg (187 lb 3.2 oz)  09/29/20 84.4 kg (186 lb)  09/22/20 84.8 kg (187 lb)    PHYSICAL EXAM: General:  NAD. No resp difficulty HEENT: Normal Neck: Supple. No JVD. Carotids 2+ bilat; no bruits. No lymphadenopathy or thryomegaly appreciated. Cor: PMI nondisplaced. Regular rate & rhythm. No rubs, gallops or murmurs. Lungs:  Clear Abdomen: Soft, nontender, nondistended. No hepatosplenomegaly. No bruits or masses. Good bowel sounds. Extremities: No cyanosis, clubbing, rash, edema Neuro: Alert & oriented x 3, cranial nerves grossly intact. Moves all 4 extremities w/o difficulty. Affect pleasant.  ASSESSMENT & PLAN: 1. Chronic systolic HF: Nonischemic cardiomyopathy s/p [employer] ICD. Narrow QRS so not CRT candidate. Echo 06/2014 showed 30-35% with diffuse hypokinesis. Echo in 5/18 with EF improved to 50-55%. He stopped all his meds and was lost to followup for about 4 years.  Echo in 6/22 showed EF significantly down to 25-30% with mild RV dysfunction.  [place] 8/22 with revealing normal filling pressures, low cardiac output but not markedly low. Severe RCA disease but does not explain significant fall in his LV EF by echo, but certainly could be the cause of his exertional fatigue/dyspnea given normal filling pressures. NYHA II, he is not volume overloaded on exam today. - Increase Entresto to 49/51 mg bid. BMET today, repeat in 10 days.    - Change lasix to prn. - Continue Coreg 25 mg bid.  - Continue spironolactone 25 mg daily.  - Continue empagliflozin.  2. CAD: S/p PCI/DESx2 to RCA. Does have concomitant disease of 70% 1st OM, 50% 1st Diag to be treated  medically.  - Plan for monotherapy with plavix given the need for Eliquis for at least 6 months.  - OK to transition from Plavix to ASA 40m daily after 6 months (1/23).  - He has been referred to Cardiac Rehab. 3. Atrial fibrillation: Paroxysmal, NSR today.  - Continue Eliquis 5 mg bid. No bleeding issues.  - Check CBC today. 4. OSA: Unable to tolerate CPAP. 5. Polysubstance abuse: Has been drug free since 06/2013.  6. Stroke: In 5/15, likely related to atrial fibrillation. Now on Eliquis.  7. Smoking: Still smoking 1/2 ppd.  I recommended cessation.  He is not interested in Wellbutrin currently.    Followup with Dr. MAundra Dubinas scheduled next month.  [place]  10/18/2020

## 2020-10-18 ENCOUNTER — Other Ambulatory Visit: Payer: Self-pay

## 2020-10-18 ENCOUNTER — Ambulatory Visit (HOSPITAL_COMMUNITY)
Admission: RE | Admit: 2020-10-18 | Discharge: 2020-10-18 | Disposition: A | Discharge status: Home / Self Care | Payer: 59 | Source: Ambulatory Visit | Attending: Family Medicine | Admitting: Family Medicine

## 2020-10-18 ENCOUNTER — Other Ambulatory Visit (HOSPITAL_COMMUNITY): Payer: Self-pay | Admitting: Family Medicine

## 2020-10-18 ENCOUNTER — Ambulatory Visit (HOSPITAL_COMMUNITY): Payer: 59

## 2020-10-18 ENCOUNTER — Encounter (HOSPITAL_COMMUNITY): Payer: Self-pay

## 2020-10-18 VITALS — BP 138/70 | HR 86 | Wt 187.2 lb

## 2020-10-18 DIAGNOSIS — Z7902 Long term (current) use of antithrombotics/antiplatelets: Secondary | ICD-10-CM | POA: Insufficient documentation

## 2020-10-18 DIAGNOSIS — F17200 Nicotine dependence, unspecified, uncomplicated: Secondary | ICD-10-CM

## 2020-10-18 DIAGNOSIS — Z833 Family history of diabetes mellitus: Secondary | ICD-10-CM | POA: Insufficient documentation

## 2020-10-18 DIAGNOSIS — G4733 Obstructive sleep apnea (adult) (pediatric): Secondary | ICD-10-CM | POA: Diagnosis not present

## 2020-10-18 DIAGNOSIS — I110 Hypertensive heart disease with heart failure: Secondary | ICD-10-CM | POA: Diagnosis not present

## 2020-10-18 DIAGNOSIS — R9431 Abnormal electrocardiogram [ECG] [EKG]: Secondary | ICD-10-CM | POA: Insufficient documentation

## 2020-10-18 DIAGNOSIS — Z8673 Personal history of transient ischemic attack (TIA), and cerebral infarction without residual deficits: Secondary | ICD-10-CM | POA: Diagnosis not present

## 2020-10-18 DIAGNOSIS — E119 Type 2 diabetes mellitus without complications: Secondary | ICD-10-CM | POA: Diagnosis not present

## 2020-10-18 DIAGNOSIS — I480 Paroxysmal atrial fibrillation: Secondary | ICD-10-CM

## 2020-10-18 DIAGNOSIS — F17210 Nicotine dependence, cigarettes, uncomplicated: Secondary | ICD-10-CM | POA: Insufficient documentation

## 2020-10-18 DIAGNOSIS — I428 Other cardiomyopathies: Secondary | ICD-10-CM | POA: Diagnosis not present

## 2020-10-18 DIAGNOSIS — I5022 Chronic systolic (congestive) heart failure: Secondary | ICD-10-CM | POA: Diagnosis present

## 2020-10-18 DIAGNOSIS — Z7901 Long term (current) use of anticoagulants: Secondary | ICD-10-CM | POA: Insufficient documentation

## 2020-10-18 DIAGNOSIS — Z794 Long term (current) use of insulin: Secondary | ICD-10-CM | POA: Insufficient documentation

## 2020-10-18 DIAGNOSIS — Z79899 Other long term (current) drug therapy: Secondary | ICD-10-CM | POA: Insufficient documentation

## 2020-10-18 DIAGNOSIS — F1911 Other psychoactive substance abuse, in remission: Secondary | ICD-10-CM

## 2020-10-18 DIAGNOSIS — Z955 Presence of coronary angioplasty implant and graft: Secondary | ICD-10-CM | POA: Diagnosis not present

## 2020-10-18 DIAGNOSIS — I2510 Atherosclerotic heart disease of native coronary artery without angina pectoris: Secondary | ICD-10-CM

## 2020-10-18 LAB — BASIC METABOLIC PANEL
Anion gap: 9 (ref 5–15)
BUN: 20 mg/dL (ref 6–20)
CO2: 22 mmol/L (ref 22–32)
Calcium: 9 mg/dL (ref 8.9–10.3)
Chloride: 106 mmol/L (ref 98–111)
Creatinine, Ser: 1.07 mg/dL (ref 0.61–1.24)
GFR, Estimated: >60 mL/min (ref >60)
Glucose, Bld: 71 mg/dL (ref 70–99)
Potassium: 4.3 mmol/L (ref 3.5–5.1)
Sodium: 137 mmol/L (ref 135–145)

## 2020-10-18 LAB — CBC
HCT: 47 % (ref 39.0–52.0)
Hemoglobin: 15.5 g/dL (ref 13.0–17.0)
MCH: 32.6 pg (ref 26.0–34.0)
MCHC: 33 g/dL (ref 30.0–36.0)
MCV: 98.9 fL (ref 80.0–100.0)
Platelets: 175 10*3/uL (ref 150–400)
RBC: 4.75 MIL/uL (ref 4.22–5.81)
RDW: 15.7 % — ABNORMAL HIGH (ref 11.5–15.5)
WBC: 6.1 10*3/uL (ref 4.0–10.5)
nRBC: 0 % (ref 0.0–0.2)

## 2020-10-18 MED ORDER — FUROSEMIDE 20 MG PO TABS: 20.0000 mg | ORAL_TABLET | ORAL | 11 refills | Status: DC | PRN

## 2020-10-18 MED ORDER — SACUBITRIL-VALSARTAN 49-51 MG PO TABS: 1.0000 | ORAL_TABLET | Freq: Two times a day (BID) | ORAL | Status: DC

## 2020-10-18 NOTE — Patient Instructions (Addendum)
Labs today We will only contact you if something comes back abnormal or we need to make some changes. Otherwise no news is good news!  It was great to see you today!  **STOP you ENTRESTO 24/26 MG **  **START ENTRESTO 49/51 MG TWICE A DAY**  **STOP lasix every other day AND ONLY TAKE for weight gain of 3 lbs in 24 hours or 5 lbs in a week.**  You will need repeat lab work in 10 days and follow back with Dr. Aundra Dubin as scheduled on 11/08/20 at 9:40 am  At the [facility], you and your health needs are our priority. As part of our continuing mission to provide you with exceptional heart care, we have created designated Provider Care Teams. These Care Teams include your primary Cardiologist (physician) and Advanced Practice Providers (APPs- Physician Assistants and Nurse Practitioners) who all work together to provide you with the care you need, when you need it.   You may see any of the following providers on your designated Care Team at your next follow up: Dr Glori Bickers Dr Loralie Champagne Dr Patrice Paradise, NP Lyda Jester, [place] Ginnie Smart Audry Riles, PharmD   Please be sure to bring in all your medications bottles to every appointment.

## 2020-10-30 ENCOUNTER — Ambulatory Visit (HOSPITAL_COMMUNITY)
Admission: RE | Admit: 2020-10-30 | Discharge: 2020-10-30 | Disposition: A | Discharge status: Home / Self Care | Payer: 59 | Source: Ambulatory Visit | Attending: Internal Medicine | Admitting: Internal Medicine

## 2020-10-30 ENCOUNTER — Other Ambulatory Visit: Payer: Self-pay

## 2020-10-30 DIAGNOSIS — I5022 Chronic systolic (congestive) heart failure: Secondary | ICD-10-CM | POA: Insufficient documentation

## 2020-10-30 LAB — BASIC METABOLIC PANEL
Anion gap: 5 (ref 5–15)
BUN: 14 mg/dL (ref 6–20)
CO2: 22 mmol/L (ref 22–32)
Calcium: 9.2 mg/dL (ref 8.9–10.3)
Chloride: 109 mmol/L (ref 98–111)
Creatinine, Ser: 1.23 mg/dL (ref 0.61–1.24)
GFR, Estimated: >60 mL/min (ref >60)
Glucose, Bld: 82 mg/dL (ref 70–99)
Potassium: 4.1 mmol/L (ref 3.5–5.1)
Sodium: 136 mmol/L (ref 135–145)

## 2020-11-08 ENCOUNTER — Other Ambulatory Visit: Payer: Self-pay

## 2020-11-08 ENCOUNTER — Ambulatory Visit (HOSPITAL_COMMUNITY)
Admission: RE | Admit: 2020-11-08 | Discharge: 2020-11-08 | Disposition: A | Discharge status: Home / Self Care | Payer: 59 | Source: Ambulatory Visit | Attending: Cardiology | Admitting: Cardiology

## 2020-11-08 ENCOUNTER — Encounter (HOSPITAL_COMMUNITY): Payer: Self-pay | Admitting: Cardiology

## 2020-11-08 VITALS — BP 140/70 | HR 82 | Wt 184.0 lb

## 2020-11-08 DIAGNOSIS — Z955 Presence of coronary angioplasty implant and graft: Secondary | ICD-10-CM | POA: Insufficient documentation

## 2020-11-08 DIAGNOSIS — Z7984 Long term (current) use of oral hypoglycemic drugs: Secondary | ICD-10-CM | POA: Insufficient documentation

## 2020-11-08 DIAGNOSIS — G4733 Obstructive sleep apnea (adult) (pediatric): Secondary | ICD-10-CM | POA: Insufficient documentation

## 2020-11-08 DIAGNOSIS — F17210 Nicotine dependence, cigarettes, uncomplicated: Secondary | ICD-10-CM | POA: Diagnosis not present

## 2020-11-08 DIAGNOSIS — I2510 Atherosclerotic heart disease of native coronary artery without angina pectoris: Secondary | ICD-10-CM | POA: Diagnosis not present

## 2020-11-08 DIAGNOSIS — I480 Paroxysmal atrial fibrillation: Secondary | ICD-10-CM | POA: Insufficient documentation

## 2020-11-08 DIAGNOSIS — I428 Other cardiomyopathies: Secondary | ICD-10-CM | POA: Diagnosis not present

## 2020-11-08 DIAGNOSIS — Z7902 Long term (current) use of antithrombotics/antiplatelets: Secondary | ICD-10-CM | POA: Diagnosis not present

## 2020-11-08 DIAGNOSIS — E119 Type 2 diabetes mellitus without complications: Secondary | ICD-10-CM | POA: Diagnosis not present

## 2020-11-08 DIAGNOSIS — Z794 Long term (current) use of insulin: Secondary | ICD-10-CM | POA: Diagnosis not present

## 2020-11-08 DIAGNOSIS — Z8673 Personal history of transient ischemic attack (TIA), and cerebral infarction without residual deficits: Secondary | ICD-10-CM | POA: Insufficient documentation

## 2020-11-08 DIAGNOSIS — Z7901 Long term (current) use of anticoagulants: Secondary | ICD-10-CM | POA: Diagnosis not present

## 2020-11-08 DIAGNOSIS — Z79899 Other long term (current) drug therapy: Secondary | ICD-10-CM | POA: Diagnosis not present

## 2020-11-08 DIAGNOSIS — Z95810 Presence of automatic (implantable) cardiac defibrillator: Secondary | ICD-10-CM | POA: Insufficient documentation

## 2020-11-08 DIAGNOSIS — I110 Hypertensive heart disease with heart failure: Secondary | ICD-10-CM | POA: Diagnosis not present

## 2020-11-08 DIAGNOSIS — I5022 Chronic systolic (congestive) heart failure: Secondary | ICD-10-CM | POA: Diagnosis not present

## 2020-11-08 LAB — BASIC METABOLIC PANEL
Anion gap: 7 (ref 5–15)
BUN: 13 mg/dL (ref 6–20)
CO2: 21 mmol/L — ABNORMAL LOW (ref 22–32)
Calcium: 9.6 mg/dL (ref 8.9–10.3)
Chloride: 108 mmol/L (ref 98–111)
Creatinine, Ser: 0.99 mg/dL (ref 0.61–1.24)
GFR, Estimated: >60 mL/min (ref >60)
Glucose, Bld: 70 mg/dL (ref 70–99)
Potassium: 4.9 mmol/L (ref 3.5–5.1)
Sodium: 136 mmol/L (ref 135–145)

## 2020-11-08 MED ORDER — ENTRESTO 49-51 MG PO TABS: 1.0000 | ORAL_TABLET | Freq: Two times a day (BID) | ORAL | 3 refills | Status: DC

## 2020-11-08 MED ORDER — CLOPIDOGREL BISULFATE 75 MG PO TABS: 75.0000 mg | ORAL_TABLET | Freq: Every day | ORAL | 4 refills | Status: DC

## 2020-11-08 NOTE — Patient Instructions (Signed)
Increase Entresto to 49/51 mg Twice daily   Labs done today, we will call you for abnormal results  Your physician recommends that you return for lab work in: 1 week, this can be done at your Primary Care Providers office  Your physician recommends that you schedule a follow-up appointment in: 3 months with echocardiogram  If you have any questions or concerns before your next appointment please send [place] a message through [place] or call our office at [phone].    TO LEAVE A MESSAGE FOR THE NURSE SELECT OPTION 2, PLEASE LEAVE A MESSAGE INCLUDING: YOUR NAME DATE OF BIRTH CALL BACK NUMBER REASON FOR CALL**this is important as we prioritize the call backs  YOU WILL RECEIVE A CALL BACK THE SAME DAY AS LONG AS YOU CALL BEFORE 4:00 PM  At the [facility], you and your health needs are our priority. As part of our continuing mission to provide you with exceptional heart care, we have created designated Provider Care Teams. These Care Teams include your primary Cardiologist (physician) and Advanced Practice Providers (APPs- Physician Assistants and Nurse Practitioners) who all work together to provide you with the care you need, when you need it.   You may see any of the following providers on your designated Care Team at your next follow up: Dr Glori Bickers Dr Loralie Champagne Dr Patrice Paradise, NP Lyda Jester, [place] [facility] Audry Riles, PharmD   Please be sure to bring in all your medications bottles to every appointment.

## 2020-11-08 NOTE — Addendum Note (Signed)
Encounter addended by: Larey Dresser, MD on: 11/08/2020 10:12 PM  Actions taken: Level of Service modified

## 2020-11-08 NOTE — Progress Notes (Signed)
Patient ID: Joseph [name], male   DOB: 08-04-1965, 55 y.o.   MRN: [id number]     Advanced Heart Failure Clinic Note   PCP: Dr. Pilar Plate EP: Dr Caryl Comes  HF: Dr. Aundra Dubin   HPI: Joseph [name] is a 55 y.o. male with history of chronic systolic HF, nonischemic cardiomyopathy, HTN, TIA, stroke, DM2, paroxysmal atrial fibrillation, tobacco abuse and polysubstance abuse.   Patient had atrial fibrillation noted on ICD interrogation and started apixaban in 1/16.   Echo in 5/18 showed improvement in EF to 50-55% with mild LVH.   Patient was lost to followup for about 4 years and stopped all his medications, it appears. He was seen in the ER in 4/22 with dyspnea/CHF and was started on Lasix.  He was seen by primary care and started back on some of his CHF medications.  Echo in 6/22 showed EF down to 25-30% with mild LV dilation, global hypokinesis, mildly decreased RV systolic function.  LHC/RHC in 7/22 showed severe disease in RCA treated by PCI; filling pressures were normal and CI mildly low at 2.13.   He returns for followup of CHF.  Still smoking. He denies exertional dyspnea or chest pain.  No significant fatigue.  No BRBPR/melena. No lightheadedness.  Tolerating meds without problems.   Labs (5/18): K 4.5, creatinine 1.0 Labs (8/18): hgb 13.3, LDL 51, K 3.9, creatinine 2.27 Labs (10/18): K 4.4, creatinine 1.19 Labs (6/22): K 4.7, creatinine 1.4 Labs (8/22): K 4.1, creatinine 1.23  SH: No drugs as of 06/28/13. Quit smoking in 10/18. No ETOH.   FH: Mother: DM2, living        Father deceased, no issues  Review of systems complete and found to be negative unless listed in HPI.    PMH: 1) HTN 2) NICM: LHC (06/2013): Mild coronary obstructive disease with 30% narrowing in the mid AV groove circumflex, and 30% proximal and 40% mid RCA stenoses with otherwise normal LAD and ramus intermediate vessel.  RHC (06/2013) RA: 6, RV: 26/9, PA: 26/13, PC: a 18 mean 13, CO/CI Fick: 3.7/2.0, left ventriculography  EF 15%; ECHO (06/2013) EF 20%, diffuse HK, mild MR; TEE (07/2013) no thrombus; ECHO (10/2013): EF 20-25%, diff HK, LA mod dilated; [employer] ICD implanted (12/01/13).  Echo (4/16) with EF 30-35%, diffuse hypokinesis, normal RV.  - Echo (5/18) with EF 50-55%, mild LVH.  - Echo (6/22) with EF down to 25-30% with mild LV dilation, global hypokinesis, mildly decreased RV systolic function.  - LHC/RHC (8/22): Severe RCA disease treated with PCI (not enough CAD to explain severity of fall in EF); mean RA 1, PA 42/18, mean PCWP 7, CI 2.13.  3) DM2 4) Stroke: MRI (07/2013) revealed an acute infarction, with a greater than 3 cm infarct involving left frontal opercular cortex and subcortical white matter. Carotid dopplers (07/2013) mild soft plaque origin ICA. 1-39% ICA stenosis. Vertebral artery flow is antegrade 5) Polysubstance abuse: + cocaine UDS in past.  6) Atrial fibrillation: Paroxysmal, started Eliquis in 1/16.  7) Nephrolithiasis with AKI 8) CAD: LHC in 8/22 with severe RCA disease treated by PCI.    Current Outpatient Medications  Medication Sig Dispense Refill   apixaban (ELIQUIS) 5 MG TABS tablet Take 1 tablet (5 mg total) by mouth 2 (two) times daily. 180 tablet 3   atorvastatin (LIPITOR) 80 MG tablet Take 1 tablet (80 mg total) by mouth every evening. 90 tablet 3   Blood Glucose Monitoring Suppl (ACCU-CHEK AVIVA CONNECT) w/Device KIT 1 Units  by Does not apply route daily. 1 kit 0   carvedilol (COREG) 25 MG tablet Take 1 tablet (25 mg total) by mouth 2 (two) times daily with a meal. 60 tablet 3   empagliflozin (JARDIANCE) 10 MG TABS tablet Take 10 mg by mouth daily. 90 tablet 3   furosemide (LASIX) 20 MG tablet Take 1 tablet (20 mg total) by mouth as needed. 30 tablet 11   glucose blood (ACCU-CHEK AVIVA) test strip Use as instructed 100 each 12   insulin glargine (LANTUS SOLOSTAR) 100 UNIT/ML Solostar Pen Inject 30 Units into the skin daily. 15 mL 3   Insulin Syringes, Disposable, U-100  0.3 ML MISC 1 each by Does not apply route daily. 100 each 1   metFORMIN (GLUCOPHAGE) 1000 MG tablet Take 1,000 mg by mouth 2 (two) times daily with a meal.     nitroGLYCERIN (NITROSTAT) 0.4 MG SL tablet Place 1 tablet (0.4 mg total) under the tongue every 5 (five) minutes as needed for chest pain. 25 tablet 3   ReliOn Ultra Thin Lancets MISC Patient will need to test his blood glucose once daily prior to breakfast and metformin and amaryl administration. 100 each 2   sacubitril-valsartan (ENTRESTO) 49-51 MG Take 1 tablet by mouth 2 (two) times daily. 60 tablet 3   spironolactone (ALDACTONE) 25 MG tablet Take 1 tablet (25 mg total) by mouth daily. 60 tablet 3   UNIFINE PENTIPS 31G X 6 MM MISC USE AS DIRECTED WITH INSULIN 100 each 0   clopidogrel (PLAVIX) 75 MG tablet Take 1 tablet (75 mg total) by mouth daily. 30 tablet 4   No current facility-administered medications for this encounter.   Facility-Administered Medications Ordered in Other Encounters  Medication Dose Route Frequency Provider Last Rate Last Admin   sodium chloride flush (NS) 0.9 % injection 3 mL  3 mL Intravenous Q12H Larey Dresser, MD         Vitals:   11/08/20 0938  BP: 140/70  Pulse: 82  SpO2: 97%  Weight: 83.5 kg (184 lb)   Wt Readings from Last 3 Encounters:  11/08/20 83.5 kg (184 lb)  10/18/20 84.9 kg (187 lb 3.2 oz)  09/29/20 84.4 kg (186 lb)     PHYSICAL EXAM: General: NAD Neck: Thick. No JVD, no thyromegaly or thyroid nodule.  Lungs: Clear to auscultation bilaterally with normal respiratory effort. CV: Nondisplaced PMI.  Heart regular S1/S2, no S3/S4, no murmur.  No peripheral edema.  No carotid bruit.  Normal pedal pulses.  Abdomen: Soft, nontender, no hepatosplenomegaly, no distention.  Skin: Intact without lesions or rashes.  Neurologic: Alert and oriented x 3.  Psych: Normal affect. Extremities: No clubbing or cyanosis.  HEENT: Normal.   ASSESSMENT & PLAN:  1) Chronic systolic HF:  Nonischemic cardiomyopathy s/p [employer] ICD. Narrow QRS so not CRT candidate. Echo 06/2014 showed 30-35% with diffuse hypokinesis. Echo in 5/18 with EF improved to 50-55%. He stopped all his meds and was lost to followup for about 4 years.  Echo in 6/22 showed EF significantly down to 25-30% with mild RV dysfunction.  LHC/RHC in 8/22 showed severe RCA disease treated with PCI; filling pressures were normal and CI mildly decreased at 2.13.  I do not think that he had enough coronary disease to explain the extent of his cardiomyopathy.  NYHA class I-II symptoms.  Not volume overloaded on exam.    - Continue coreg 25 mg BID.  - Continue spironolactone 25 mg daily.  - Increase Entresto  to 49/51 bid.  BMET today and in 10 days.   - Continue empagliflozin.  - Continue Lasix 20 mg prn.  - Repeat echo at followup appt in 3 months.  2) Atrial fibrillation: Paroxysmal, NSR today.  - Continue Eliquis 5 mg BID.  3) OSA: Using CPAP.   4) Polysubstance abuse: Has been drug free since 06/2013.  5) Stroke: In 5/15, likely related to atrial fibrillation. Now on Eliquis.  6) Smoking: Still smoking 1 ppd.  I recommended cessation.  He is not interested in Chantix or Wellbutrin currently.   7) CAD: Severe RCA disease on cath in 8/22, treated with PCI.  As above, I do not think the single vessel disease explains the extent of his cardiomyopathy.  - No ASA given Eliquis use.  - He will continue Plavix x 6 months then stop.  - Continue statin, check lipids next visit.   Followup 3 months with echo.   Loralie Champagne, MD  11/08/2020

## 2020-11-14 ENCOUNTER — Other Ambulatory Visit: Payer: Self-pay

## 2020-11-14 ENCOUNTER — Ambulatory Visit (INDEPENDENT_AMBULATORY_CARE_PROVIDER_SITE_OTHER): Payer: 59 | Admitting: Family Medicine

## 2020-11-14 ENCOUNTER — Encounter: Payer: Self-pay | Admitting: Family Medicine

## 2020-11-14 VITALS — BP 138/88 | HR 77 | Ht 65.0 in | Wt 185.6 lb

## 2020-11-14 DIAGNOSIS — I10 Essential (primary) hypertension: Secondary | ICD-10-CM

## 2020-11-14 DIAGNOSIS — E119 Type 2 diabetes mellitus without complications: Secondary | ICD-10-CM

## 2020-11-14 DIAGNOSIS — Z794 Long term (current) use of insulin: Secondary | ICD-10-CM | POA: Diagnosis not present

## 2020-11-14 DIAGNOSIS — Z23 Encounter for immunization: Secondary | ICD-10-CM

## 2020-11-14 LAB — POCT GLYCOSYLATED HEMOGLOBIN (HGB A1C): Hemoglobin A1C: 5.6 % (ref 4.0–5.6)

## 2020-11-14 MED ORDER — LANTUS SOLOSTAR 100 UNIT/ML ~~LOC~~ SOPN: 25.0000 [IU] | PEN_INJECTOR | Freq: Every day | SUBCUTANEOUS | 3 refills | Status: DC

## 2020-11-14 NOTE — Assessment & Plan Note (Signed)
BP in office 146/90 with repeat of 138/88. Patient has not taken his medications yet this morning. Currently on carvedilol '25mg'$  Bid, spironolactone '25mg'$  daily, lasix '20mg'$  daily.  - Continue current regiment - Counseled on intermittently checking BP at home

## 2020-11-14 NOTE — Assessment & Plan Note (Signed)
A1c significantly improved to 5.6 today with no signs of neuropathy at this time. Blood sugar log with some concerning lower fasting sugars that are asymptomatic for the patient. Counseled on signs and symptoms of hypoglycemic episodes and ED precautions. Decreasing Lantus to 25 Units daily from 30 Units.  - Lantus 25Units, empagliflozin '10mg'$ , metformin '1000mg'$  BID - Atorvastatin '80mg'$   - Lipid panel today - Diabetic foot exam was done today - Ophthalmology exam done 2-3 months ago, awaiting records

## 2020-11-14 NOTE — Patient Instructions (Signed)
Your A1c today was 5.6, which is a great improvement.  I am concerned about having some lower blood sugar readings as they can cause issues such as dizziness and passing out or worsening without meaning to go to the ED.  I want you to decrease your Lantus dose from 30 units to 25 units.  Keep taking the fasting blood sugars in the morning and make sure to follow-up in 3 months for an A1c recheck.  We will try to decrease your insulin requirement as we are able.

## 2020-11-14 NOTE — Progress Notes (Signed)
    SUBJECTIVE:   CHIEF COMPLAINT / HPI:   HTN Patient reports he does not check his blood pressure at home but has been compliant with his medications.  He did not take any of his medications this morning and does not note any symptoms of hypotension or other signs such as vision changes or headaches.  T2DM Eye exam was done 2-3 months ago with no concerns.  Patient reports he has been compliant with all of his medications including Lantus 30 units, empagliflozin 10 mg daily, metformin 1000 mg twice daily.  Patient does check his fasting blood sugars in the morning and they are listed below.  PERTINENT  PMH / PSH: Reviewed  OBJECTIVE:   BP 138/88   Pulse 77   Ht '5\' 5"'$  (1.651 m)   Wt 185 lb 9.6 oz (84.2 kg)   SpO2 100%   BMI 30.89 kg/m   Gen: well-appearing, NAD CV: RRR, no m/r/g appreciated, no peripheral edema Pulm: CTAB, no wheezes/crackles  Diabetic Foot Exam - Simple   Simple Foot Form Diabetic Foot exam was performed with the following findings: Yes 11/14/2020  9:55 AM  Visual Inspection No deformities, no ulcerations, no other skin breakdown bilaterally: Yes Sensation Testing Intact to touch and monofilament testing bilaterally: Yes Pulse Check Posterior Tibialis and Dorsalis pulse intact bilaterally: Yes Comments       ASSESSMENT/PLAN:   HTN (hypertension) BP in office 146/90 with repeat of 138/88. Patient has not taken his medications yet this morning. Currently on carvedilol '25mg'$  Bid, spironolactone '25mg'$  daily, lasix '20mg'$  daily.  - Continue current regiment - Counseled on intermittently checking BP at home  Long-term insulin use in type 2 diabetes (HCC) A1c significantly improved to 5.6 today with no signs of neuropathy at this time. Blood sugar log with some concerning lower fasting sugars that are asymptomatic for the patient. Counseled on signs and symptoms of hypoglycemic episodes and ED precautions. Decreasing Lantus to 25 Units daily from 30 Units.  -  Lantus 25Units, empagliflozin '10mg'$ , metformin '1000mg'$  BID - Atorvastatin '80mg'$   - Lipid panel today - Diabetic foot exam was done today - Ophthalmology exam done 2-3 months ago, awaiting records     [place]

## 2020-11-15 LAB — LIPID PANEL
Chol/HDL Ratio: 2.1 ratio (ref 0.0–5.0)
Cholesterol, Total: 135 mg/dL (ref 100–199)
HDL: 63 mg/dL (ref >39)
LDL Chol Calc (NIH): 59 mg/dL (ref 0–99)
Triglycerides: 61 mg/dL (ref 0–149)
VLDL Cholesterol Cal: 13 mg/dL (ref 5–40)

## 2020-11-16 ENCOUNTER — Encounter: Payer: Self-pay | Admitting: Family Medicine

## 2020-11-23 ENCOUNTER — Telehealth (HOSPITAL_COMMUNITY): Payer: Self-pay

## 2020-11-23 NOTE — Telephone Encounter (Signed)
Called and spoke with pt in regards to CR, pt stated he is not interested at this time.   Closed referral 

## 2020-12-28 ENCOUNTER — Encounter: Payer: Self-pay | Admitting: Internal Medicine

## 2021-01-20 ENCOUNTER — Other Ambulatory Visit: Payer: Self-pay | Admitting: Family Medicine

## 2021-02-07 ENCOUNTER — Other Ambulatory Visit: Payer: Self-pay

## 2021-02-07 ENCOUNTER — Ambulatory Visit (HOSPITAL_COMMUNITY)
Admission: RE | Admit: 2021-02-07 | Discharge: 2021-02-07 | Disposition: A | Discharge status: Home / Self Care | Payer: BC Managed Care – PPO | Source: Ambulatory Visit | Attending: Internal Medicine | Admitting: Internal Medicine

## 2021-02-07 ENCOUNTER — Encounter (HOSPITAL_COMMUNITY): Payer: Self-pay | Admitting: Cardiology

## 2021-02-07 ENCOUNTER — Ambulatory Visit (HOSPITAL_BASED_OUTPATIENT_CLINIC_OR_DEPARTMENT_OTHER)
Admission: RE | Admit: 2021-02-07 | Discharge: 2021-02-07 | Disposition: A | Discharge status: Home / Self Care | Payer: BC Managed Care – PPO | Source: Ambulatory Visit | Attending: Cardiology | Admitting: Cardiology

## 2021-02-07 VITALS — BP 142/80 | HR 79 | Wt 191.6 lb

## 2021-02-07 DIAGNOSIS — F17210 Nicotine dependence, cigarettes, uncomplicated: Secondary | ICD-10-CM | POA: Diagnosis not present

## 2021-02-07 DIAGNOSIS — Z7901 Long term (current) use of anticoagulants: Secondary | ICD-10-CM | POA: Diagnosis not present

## 2021-02-07 DIAGNOSIS — Z7902 Long term (current) use of antithrombotics/antiplatelets: Secondary | ICD-10-CM | POA: Insufficient documentation

## 2021-02-07 DIAGNOSIS — I2510 Atherosclerotic heart disease of native coronary artery without angina pectoris: Secondary | ICD-10-CM | POA: Diagnosis not present

## 2021-02-07 DIAGNOSIS — I428 Other cardiomyopathies: Secondary | ICD-10-CM | POA: Insufficient documentation

## 2021-02-07 DIAGNOSIS — G4733 Obstructive sleep apnea (adult) (pediatric): Secondary | ICD-10-CM | POA: Diagnosis not present

## 2021-02-07 DIAGNOSIS — I5022 Chronic systolic (congestive) heart failure: Secondary | ICD-10-CM | POA: Diagnosis not present

## 2021-02-07 DIAGNOSIS — Z955 Presence of coronary angioplasty implant and graft: Secondary | ICD-10-CM | POA: Diagnosis not present

## 2021-02-07 DIAGNOSIS — E119 Type 2 diabetes mellitus without complications: Secondary | ICD-10-CM | POA: Diagnosis not present

## 2021-02-07 DIAGNOSIS — Z09 Encounter for follow-up examination after completed treatment for conditions other than malignant neoplasm: Secondary | ICD-10-CM | POA: Insufficient documentation

## 2021-02-07 DIAGNOSIS — I5020 Unspecified systolic (congestive) heart failure: Secondary | ICD-10-CM | POA: Diagnosis not present

## 2021-02-07 DIAGNOSIS — Z8673 Personal history of transient ischemic attack (TIA), and cerebral infarction without residual deficits: Secondary | ICD-10-CM | POA: Diagnosis not present

## 2021-02-07 DIAGNOSIS — I480 Paroxysmal atrial fibrillation: Secondary | ICD-10-CM | POA: Insufficient documentation

## 2021-02-07 DIAGNOSIS — I110 Hypertensive heart disease with heart failure: Secondary | ICD-10-CM | POA: Diagnosis not present

## 2021-02-07 DIAGNOSIS — Z79899 Other long term (current) drug therapy: Secondary | ICD-10-CM | POA: Insufficient documentation

## 2021-02-07 LAB — ECHOCARDIOGRAM COMPLETE
Calc EF: 35.7 %
S' Lateral: 4.4 cm
Single Plane A2C EF: 34.6 %
Single Plane A4C EF: 36.7 %

## 2021-02-07 LAB — BASIC METABOLIC PANEL
Anion gap: 9 (ref 5–15)
BUN: 16 mg/dL (ref 6–20)
CO2: 23 mmol/L (ref 22–32)
Calcium: 9.4 mg/dL (ref 8.9–10.3)
Chloride: 107 mmol/L (ref 98–111)
Creatinine, Ser: 1.27 mg/dL — ABNORMAL HIGH (ref 0.61–1.24)
GFR, Estimated: >60 mL/min (ref >60)
Glucose, Bld: 98 mg/dL (ref 70–99)
Potassium: 4.8 mmol/L (ref 3.5–5.1)
Sodium: 139 mmol/L (ref 135–145)

## 2021-02-07 LAB — CBC
HCT: 44.4 % (ref 39.0–52.0)
Hemoglobin: 14.8 g/dL (ref 13.0–17.0)
MCH: 34.8 pg — ABNORMAL HIGH (ref 26.0–34.0)
MCHC: 33.3 g/dL (ref 30.0–36.0)
MCV: 104.5 fL — ABNORMAL HIGH (ref 80.0–100.0)
Platelets: 174 10*3/uL (ref 150–400)
RBC: 4.25 MIL/uL (ref 4.22–5.81)
RDW: 13.6 % (ref 11.5–15.5)
WBC: 6.6 10*3/uL (ref 4.0–10.5)
nRBC: 0 % (ref 0.0–0.2)

## 2021-02-07 MED ORDER — ENTRESTO 97-103 MG PO TABS: 1.0000 | ORAL_TABLET | Freq: Two times a day (BID) | ORAL | 6 refills | Status: DC

## 2021-02-07 NOTE — Patient Instructions (Signed)
Increase Entresto to 97/103 mg, one tab twice a day  Labs today We will only contact you if something comes back abnormal or we need to make some changes. Otherwise no news is good news!  Labs needed in 7-10 days  Your physician recommends that you schedule a follow-up appointment in: 3 months  in the Advanced Practitioners (PA/NP) Clinic     Do the following things EVERYDAY: Weigh yourself in the morning before breakfast. Write it down and keep it in a log. Take your medicines as prescribed Eat low salt foods--Limit salt (sodium) to 2000 mg per day.  Stay as active as you can everyday Limit all fluids for the day to less than 2 liters   At the [facility], you and your health needs are our priority. As part of our continuing mission to provide you with exceptional heart care, we have created designated Provider Care Teams. These Care Teams include your primary Cardiologist (physician) and Advanced Practice Providers (APPs- Physician Assistants and Nurse Practitioners) who all work together to provide you with the care you need, when you need it.   You may see any of the following providers on your designated Care Team at your next follow up: Dr Glori Bickers Dr Haynes Kerns, NP Lyda Jester, [place] [facility] [place], [place] Audry Riles, PharmD   Please be sure to bring in all your medications bottles to every appointment.    If you have any questions or concerns before your next appointment please send [place] a message through [place] or call our office at [phone].    TO LEAVE A MESSAGE FOR THE NURSE SELECT OPTION 2, PLEASE LEAVE A MESSAGE INCLUDING: YOUR NAME DATE OF BIRTH CALL BACK NUMBER REASON FOR CALL**this is important as we prioritize the call backs  YOU WILL RECEIVE A CALL BACK THE SAME DAY AS LONG AS YOU CALL BEFORE 4:00 PM

## 2021-02-07 NOTE — Progress Notes (Signed)
Patient ID: Joseph [name], male   DOB: 1965-03-23, 55 y.o.   MRN: [id number]     Advanced Heart Failure Clinic Note   PCP: Dr. Pilar Plate EP: Dr Caryl Comes  HF: Dr. Aundra Dubin   HPI: Joseph [name] is a 55 y.o. male with history of chronic systolic HF, nonischemic cardiomyopathy, HTN, TIA, stroke, DM2, paroxysmal atrial fibrillation, tobacco abuse and polysubstance abuse.   Patient had atrial fibrillation noted on ICD interrogation and started apixaban in 1/16.   Echo in 5/18 showed improvement in EF to 50-55% with mild LVH.   Patient was lost to followup for about 4 years and stopped all his medications, it appears. He was seen in the ER in 4/22 with dyspnea/CHF and was started on Lasix.  He was seen by primary care and started back on some of his CHF medications.  Echo in 6/22 showed EF down to 25-30% with mild LV dilation, global hypokinesis, mildly decreased RV systolic function.  LHC/RHC in 7/22 showed severe disease in RCA treated by PCI; filling pressures were normal and CI mildly low at 2.13.   Echo was done today and reviewed, EF 30% with diffuse hypokinesis, mild LVH, mildly decreased RV systolic function.   He returns for followup of CHF.  Still smoking but trying to cut back.  No chest pain.  No BRPBR/melena.  No significant exertional dyspnea.  Weight is up 7 lbs.  He takes Lasix 1-2 times/week.   Labs (5/18): K 4.5, creatinine 1.0 Labs (8/18): hgb 13.3, LDL 51, K 3.9, creatinine 2.27 Labs (10/18): K 4.4, creatinine 1.19 Labs (6/22): K 4.7, creatinine 1.4 Labs (8/22): K 4.1, creatinine 1.23 Labs (9/22): LDL 59, HDL 63, K 4.9, creatinine 0.99  SH: No drugs as of 06/28/13. Quit smoking in 10/18. No ETOH.   FH: Mother: DM2, living        Father deceased, no issues  Review of systems complete and found to be negative unless listed in HPI.    PMH: 1) HTN 2) NICM: LHC (06/2013): Mild coronary obstructive disease with 30% narrowing in the mid AV groove circumflex, and 30% proximal and 40%  mid RCA stenoses with otherwise normal LAD and ramus intermediate vessel.  RHC (06/2013) RA: 6, RV: 26/9, PA: 26/13, PC: a 18 mean 13, CO/CI Fick: 3.7/2.0, left ventriculography EF 15%; ECHO (06/2013) EF 20%, diffuse HK, mild MR; TEE (07/2013) no thrombus; ECHO (10/2013): EF 20-25%, diff HK, LA mod dilated; [employer] ICD implanted (12/01/13).  Echo (4/16) with EF 30-35%, diffuse hypokinesis, normal RV.  - Echo (5/18) with EF 50-55%, mild LVH.  - Echo (6/22) with EF down to 25-30% with mild LV dilation, global hypokinesis, mildly decreased RV systolic function.  - LHC/RHC (8/22): Severe RCA disease treated with PCI (not enough CAD to explain severity of fall in EF); mean RA 1, PA 42/18, mean PCWP 7, CI 2.13.  3) DM2 4) Stroke: MRI (07/2013) revealed an acute infarction, with a greater than 3 cm infarct involving left frontal opercular cortex and subcortical white matter. Carotid dopplers (07/2013) mild soft plaque origin ICA. 1-39% ICA stenosis. Vertebral artery flow is antegrade 5) Polysubstance abuse: + cocaine UDS in past.  6) Atrial fibrillation: Paroxysmal, started Eliquis in 1/16.  7) Nephrolithiasis with AKI 8) CAD: LHC in 8/22 with severe RCA disease treated by PCI.    Current Outpatient Medications  Medication Sig Dispense Refill   apixaban (ELIQUIS) 5 MG TABS tablet Take 1 tablet (5 mg total) by mouth 2 (two)  times daily. 180 tablet 3   atorvastatin (LIPITOR) 80 MG tablet Take 1 tablet (80 mg total) by mouth every evening. 90 tablet 3   Blood Glucose Monitoring Suppl (ACCU-CHEK AVIVA CONNECT) w/Device KIT 1 Units by Does not apply route daily. 1 kit 0   carvedilol (COREG) 25 MG tablet Take 1 tablet (25 mg total) by mouth 2 (two) times daily with a meal. 60 tablet 3   clopidogrel (PLAVIX) 75 MG tablet Take 1 tablet (75 mg total) by mouth daily. 30 tablet 4   empagliflozin (JARDIANCE) 10 MG TABS tablet Take 10 mg by mouth daily. 90 tablet 3   furosemide (LASIX) 20 MG tablet Take 1 tablet  (20 mg total) by mouth as needed. 30 tablet 11   glucose blood (ACCU-CHEK AVIVA) test strip Use as instructed 100 each 12   insulin glargine (LANTUS SOLOSTAR) 100 UNIT/ML Solostar Pen Inject 25 Units into the skin daily. 15 mL 3   Insulin Syringes, Disposable, U-100 0.3 ML MISC 1 each by Does not apply route daily. 100 each 1   metFORMIN (GLUCOPHAGE) 1000 MG tablet TAKE 1 TABLET BY MOUTH TWICE DAILY WITH MEALS 60 tablet 2   nitroGLYCERIN (NITROSTAT) 0.4 MG SL tablet Place 1 tablet (0.4 mg total) under the tongue every 5 (five) minutes as needed for chest pain. 25 tablet 3   ReliOn Ultra Thin Lancets MISC Patient will need to test his blood glucose once daily prior to breakfast and metformin and amaryl administration. 100 each 2   sacubitril-valsartan (ENTRESTO) 97-103 MG Take 1 tablet by mouth 2 (two) times daily. 60 tablet 6   spironolactone (ALDACTONE) 25 MG tablet Take 1 tablet (25 mg total) by mouth daily. 60 tablet 3   UNIFINE PENTIPS 31G X 6 MM MISC USE AS DIRECTED WITH INSULIN 100 each 0   No current facility-administered medications for this encounter.   Facility-Administered Medications Ordered in Other Encounters  Medication Dose Route Frequency Provider Last Rate Last Admin   sodium chloride flush (NS) 0.9 % injection 3 mL  3 mL Intravenous Q12H Larey Dresser, MD         Vitals:   02/07/21 0925  BP: (!) 142/80  Pulse: 79  SpO2: 98%  Weight: 86.9 kg (191 lb 9.6 oz)   Wt Readings from Last 3 Encounters:  02/07/21 86.9 kg (191 lb 9.6 oz)  11/14/20 84.2 kg (185 lb 9.6 oz)  11/08/20 83.5 kg (184 lb)     PHYSICAL EXAM: General: NAD Neck: No JVD, no thyromegaly or thyroid nodule.  Lungs: Clear to auscultation bilaterally with normal respiratory effort. CV: Nondisplaced PMI.  Heart regular S1/S2, no S3/S4, no murmur.  No peripheral edema.  No carotid bruit.  Normal pedal pulses.  Abdomen: Soft, nontender, no hepatosplenomegaly, no distention.  Skin: Intact without lesions  or rashes.  Neurologic: Alert and oriented x 3.  Psych: Normal affect. Extremities: No clubbing or cyanosis.  HEENT: Normal.   ASSESSMENT & PLAN:  1) Chronic systolic HF: Nonischemic cardiomyopathy s/p [employer] ICD. Narrow QRS so not CRT candidate. Echo 06/2014 showed 30-35% with diffuse hypokinesis. Echo in 5/18 with EF improved to 50-55%. He stopped all his meds and was lost to followup for about 4 years.  Echo in 6/22 showed EF significantly down to 25-30% with mild RV dysfunction.  LHC/RHC in 8/22 showed severe RCA disease treated with PCI; filling pressures were normal and CI mildly decreased at 2.13.  Echo in 12/22 with EF 30%, mildly decreased  RV systolic function.  I do not think that he had enough coronary disease to explain the extent of his cardiomyopathy.  NYHA class I-II symptoms.  Not volume overloaded on exam.    - Continue coreg 25 mg BID.  - Continue spironolactone 25 mg daily.  - Increase Entresto to 97/103 bid.  BMET today and in 10 days.   - Continue empagliflozin.  - Continue Lasix 20 mg prn.  2) Atrial fibrillation: Paroxysmal, NSR today.  - Continue Eliquis 5 mg BID. CBC today.  3) OSA: Using CPAP.   4) Polysubstance abuse: Has been drug free since 06/2013.  5) Stroke: In 5/15, likely related to atrial fibrillation. Now on Eliquis.  6) Smoking: Still smoking 1 ppd.  He is trying to cut back.  7) CAD: Severe RCA disease on cath in 8/22, treated with PCI.  As above, I do not think the single vessel disease explains the extent of his cardiomyopathy.  - No ASA given Eliquis use.  - He will continue Plavix x 6 months then stop.  - Continue statin, good lipids in 9/22.   Followup 3 months with APP  Loralie Champagne, MD  02/07/2021

## 2021-02-19 ENCOUNTER — Other Ambulatory Visit: Payer: Self-pay

## 2021-02-19 ENCOUNTER — Ambulatory Visit (HOSPITAL_COMMUNITY)
Admission: RE | Admit: 2021-02-19 | Discharge: 2021-02-19 | Disposition: A | Discharge status: Home / Self Care | Payer: BC Managed Care – PPO | Source: Ambulatory Visit | Attending: Cardiology | Admitting: Cardiology

## 2021-02-19 DIAGNOSIS — I5020 Unspecified systolic (congestive) heart failure: Secondary | ICD-10-CM | POA: Diagnosis not present

## 2021-02-19 LAB — BASIC METABOLIC PANEL
Anion gap: 7 (ref 5–15)
BUN: 13 mg/dL (ref 6–20)
CO2: 24 mmol/L (ref 22–32)
Calcium: 9.3 mg/dL (ref 8.9–10.3)
Chloride: 105 mmol/L (ref 98–111)
Creatinine, Ser: 1.28 mg/dL — ABNORMAL HIGH (ref 0.61–1.24)
GFR, Estimated: >60 mL/min (ref >60)
Glucose, Bld: 132 mg/dL — ABNORMAL HIGH (ref 70–99)
Potassium: 4.6 mmol/L (ref 3.5–5.1)
Sodium: 136 mmol/L (ref 135–145)

## 2021-03-16 ENCOUNTER — Ambulatory Visit: Payer: BC Managed Care – PPO | Admitting: Family Medicine

## 2021-03-16 ENCOUNTER — Other Ambulatory Visit: Payer: Self-pay

## 2021-03-16 ENCOUNTER — Encounter: Payer: Self-pay | Admitting: Family Medicine

## 2021-03-16 ENCOUNTER — Ambulatory Visit (INDEPENDENT_AMBULATORY_CARE_PROVIDER_SITE_OTHER): Payer: BC Managed Care – PPO

## 2021-03-16 VITALS — BP 136/88 | HR 94 | Ht 65.0 in | Wt 192.6 lb

## 2021-03-16 DIAGNOSIS — E119 Type 2 diabetes mellitus without complications: Secondary | ICD-10-CM | POA: Diagnosis not present

## 2021-03-16 DIAGNOSIS — F17200 Nicotine dependence, unspecified, uncomplicated: Secondary | ICD-10-CM

## 2021-03-16 DIAGNOSIS — Z794 Long term (current) use of insulin: Secondary | ICD-10-CM

## 2021-03-16 DIAGNOSIS — Z23 Encounter for immunization: Secondary | ICD-10-CM

## 2021-03-16 DIAGNOSIS — I10 Essential (primary) hypertension: Secondary | ICD-10-CM | POA: Diagnosis not present

## 2021-03-16 LAB — POCT GLYCOSYLATED HEMOGLOBIN (HGB A1C): HbA1c, POC (controlled diabetic range): 5.6 % (ref 0.0–7.0)

## 2021-03-16 MED ORDER — CARVEDILOL 25 MG PO TABS: 25.0000 mg | ORAL_TABLET | Freq: Two times a day (BID) | ORAL | 3 refills | Status: DC

## 2021-03-16 MED ORDER — LANTUS SOLOSTAR 100 UNIT/ML ~~LOC~~ SOPN: 18.0000 [IU] | PEN_INJECTOR | Freq: Every day | SUBCUTANEOUS | 3 refills | Status: DC

## 2021-03-16 NOTE — Assessment & Plan Note (Signed)
Currently smokes 1/2 PPD.  Not interested in quitting, counseled on smoking cessation. - Continue the conversation

## 2021-03-16 NOTE — Progress Notes (Signed)
° ° °  SUBJECTIVE:   CHIEF COMPLAINT / HPI:   T2DM follow-up Patient reports he has no complaints, does not feel any neuropathy signs and overall is stable. Med reconciliation confirmed.  Current medications include Lantus 25 units daily, metformin 1000 mg twice daily, Jardiance 10 mg daily.  Fasting blood sugars typically 80-90, has not had any symptomatic hypoglycemic episodes.  Patient is also recently reestablished with cardiology and has been placed back on several medications.  He is needing a refill of his carvedilol, confirmed with previous documentation and refill the prescription.  Patient is on Eliquis for his stroke and is on Plavix for the next several months for his CAD.  Patient continues to smoke 1/2 PPD and is not interested in quitting.  PERTINENT  PMH / PSH: Reviewed  OBJECTIVE:   BP 136/88    Pulse 94    Ht 5\' 5"  (1.651 m)    Wt 192 lb 9.6 oz (87.4 kg)    SpO2 100%    BMI 32.05 kg/m   Gen: well-appearing, NAD CV: RRR, no m/r/g appreciated, no peripheral edema Pulm: CTAB, no wheezes/crackles GI: soft, non-tender, non-distended  ASSESSMENT/PLAN:   Smoking Currently smokes 1/2 PPD.  Not interested in quitting, counseled on smoking cessation. - Continue the conversation  HTN (hypertension) BP mildly elevated 136/88 in the office.  Current medications include spironolactone 25 mg daily, Entresto 97-1 03 twice daily, carvedilol twice daily.  BMPs last month were notable for an increase in serum creatinine although GFR remains > 60. - We will continue to monitor - Continue current medications  Long-term insulin use in type 2 diabetes (HCC) A1c 5.6.  Has not had any hypoglycemic events he is aware of recently.  Fasting sugars 80s-90s. - Decrease Lantus to 18 units daily - Continue Jardiance 10 mg, metformin 1000 mg twice daily - Continue atorvastatin 80 mg - Patient to get ophthalmology exam this year - Microalbumin collected today  Medical refill Refilled  carvedilol 25mg  BID.     Joseph [name], [place]

## 2021-03-16 NOTE — Patient Instructions (Addendum)
It was so great seeing you today! Today we discussed the following:  - Your A1c today was 5.6, keep doing the great work! We are going to DECREASE your insuline today to 18 Units.  - Your blood pressure was mildly elevated, we will check your urine and that will help [place] determine if we need to adjust any of your medications.    Please make sure to bring any medications you take to your appointments. If you have any questions or concerns please call the office at [phone].

## 2021-03-16 NOTE — Assessment & Plan Note (Signed)
BP mildly elevated 136/88 in the office.  Current medications include spironolactone 25 mg daily, Entresto 97-1 03 twice daily, carvedilol twice daily.  BMPs last month were notable for an increase in serum creatinine although GFR remains > 60. - We will continue to monitor - Continue current medications

## 2021-03-16 NOTE — Assessment & Plan Note (Signed)
A1c 5.6.  Has not had any hypoglycemic events he is aware of recently.  Fasting sugars 80s-90s. - Decrease Lantus to 18 units daily - Continue Jardiance 10 mg, metformin 1000 mg twice daily - Continue atorvastatin 80 mg - Patient to get ophthalmology exam this year - Microalbumin collected today

## 2021-03-18 LAB — MICROALBUMIN / CREATININE URINE RATIO
Creatinine, Urine: 108 mg/dL
Microalb/Creat Ratio: 29 mg/g creat (ref 0–29)
Microalbumin, Urine: 30.8 ug/mL

## 2021-03-21 ENCOUNTER — Encounter: Payer: Self-pay | Admitting: Family Medicine

## 2021-04-11 ENCOUNTER — Telehealth: Payer: Self-pay | Admitting: *Deleted

## 2021-04-11 ENCOUNTER — Other Ambulatory Visit: Payer: Self-pay | Admitting: Gastroenterology

## 2021-04-11 DIAGNOSIS — I2510 Atherosclerotic heart disease of native coronary artery without angina pectoris: Secondary | ICD-10-CM | POA: Diagnosis not present

## 2021-04-11 DIAGNOSIS — Z1211 Encounter for screening for malignant neoplasm of colon: Secondary | ICD-10-CM | POA: Diagnosis not present

## 2021-04-11 DIAGNOSIS — E119 Type 2 diabetes mellitus without complications: Secondary | ICD-10-CM | POA: Diagnosis not present

## 2021-04-11 NOTE — Telephone Encounter (Signed)
° °  Pre-operative Risk Assessment    Patient Name: Joseph [name]  DOB: 01/12/1966 MRN: [id number]      Request for Surgical Clearance    Procedure:   COLONOSCOPY   Date of Surgery:  Clearance 06/22/21                                 Surgeon:  DR. HUNG Surgeon's Group or Practice Name:  [facility] Phone number:  [phone] Fax number:  [phone]   Type of Clearance Requested:   - Medical  - Pharmacy:  Hold Clopidogrel (Plavix) and Apixaban (Eliquis)     Type of Anesthesia:   PROPOFOL   Additional requests/questions:    Jiles Prows   04/11/2021, 4:10 PM

## 2021-04-12 NOTE — Telephone Encounter (Signed)
Clinical pharmacist to review Eliquis 

## 2021-04-12 NOTE — Telephone Encounter (Signed)
Colonoscopy procedure is currently scheduled for April, patient is currently scheduled to see heart failure APP in March, cardiac clearance can be addressed at that time.

## 2021-04-12 NOTE — Telephone Encounter (Deleted)
Patient with diagnosis of *** on *** for anticoagulation.    Procedure: colonoscopy Date of procedure: 06/22/21   [id number] Score =    {Click here to calculate score.  REFRESH note before signing. :1} This indicates a  % annual risk of stroke. The patient's score is based upon:        CrCl *** Platelet count ***  Patient does/does not*** require pre-op antibiotics for dental procedure.  Per office protocol, patient can hold *** for *** days prior to procedure. Preop team to address Plavix request.

## 2021-04-12 NOTE — Telephone Encounter (Signed)
Patient with diagnosis of afib on Eliquis for anticoagulation.    Procedure: colonoscopy Date of procedure: 06/22/21  CHA2DS2-VASc Score = 6  This indicates a 9.7% annual risk of stroke. The patient's score is based upon: CHF History: 1 HTN History: 1 Diabetes History: 1 Stroke History: 2 Vascular Disease History: 1 Age Score: 0 Gender Score: 0  CrCl 12mL/min using adjusted body weight Platelet count 174K  Per office protocol, patient can hold Eliquis for 1 day prior to procedure. He should resume as soon as safely possible after given his elevated stroke risk. Preop team to address Plavix request.

## 2021-04-17 NOTE — Telephone Encounter (Signed)
Patient is scheduled for colonoscopy on 06/22/21. We will need to hold Plavix. He had a recent PCI on 09/29/20.  He has a F/U appointment in CHF clinic on  05/08/21. Can you please comment on how long the patient can hold Plavix for procedure. Please rout response back to P CV DIV PREOP.  Thank you

## 2021-04-18 NOTE — Telephone Encounter (Signed)
If this is not an urgent colonoscopy (e.g. just screening), would wait until 8/23 (1 year post-PCI) before holding Plavix and doing scope.  If it is urgent, he can stop Plavix x 5 days prior and restart after (>6 months post-PCI now).

## 2021-04-24 NOTE — Telephone Encounter (Signed)
See recommendation by Dr. Aundra Dubin

## 2021-04-24 NOTE — Telephone Encounter (Signed)
° °  Patient Name: Joseph [name]  DOB: May 27, 1965 MRN: [id number]  Primary Cardiologist: Loralie Champagne, MD  Chart reviewed as part of pre-operative protocol coverage.  Will route to callback team to contact GI to inquire about urgency of colonoscopy. Per Dr. Claris Gladden recommendation, "If this is not an urgent colonoscopy (e.g. just screening), would wait until 8/23 (1 year post-PCI) before holding Plavix and doing scope.  If it is urgent, he can stop Plavix x 5 days prior and restart after (>6 months post-PCI now). " Please update preop team when this is known.   Charlie Pitter, PA-C 04/24/2021, 5:07 PM

## 2021-04-25 NOTE — Telephone Encounter (Signed)
I tried to reach Dr. Minerva Areola surgery scheduler or nurse to as to if procedure is urgent or not, please see notes from Dr. Aundra Dubin. Operator tried to get someone on the phone but I was on hold for 5+ minutes. I will send FYI notes to requesting office to please let us know if procedure is urgent. The notes being sent at this time are just asking for their office to call us back and clarify; not giving clearance at this time.

## 2021-04-25 NOTE — Telephone Encounter (Addendum)
Recommend clearance request be re-submitted by GI team closer to rescheduled procedure date so preop team can review updated history and ensure appropriate to clear at that time. (Ideal time is 1-2 months prior.) Will remove this chart from preop box at this time. Thank you!

## 2021-04-25 NOTE — Telephone Encounter (Signed)
Thank you Dr. Benson Norway for your quick reply. I will update our pre op team and Dr. Aundra Dubin. Thank you again for the help and the quick reply. Have a great day Dr. Benson Norway.

## 2021-05-08 ENCOUNTER — Ambulatory Visit (HOSPITAL_COMMUNITY)
Admission: RE | Admit: 2021-05-08 | Discharge: 2021-05-08 | Disposition: A | Discharge status: Home / Self Care | Payer: BC Managed Care – PPO | Source: Ambulatory Visit | Attending: Family Medicine | Admitting: Family Medicine

## 2021-05-08 ENCOUNTER — Encounter (HOSPITAL_COMMUNITY): Payer: Self-pay

## 2021-05-08 ENCOUNTER — Telehealth (HOSPITAL_COMMUNITY): Payer: Self-pay | Admitting: Surgery

## 2021-05-08 ENCOUNTER — Other Ambulatory Visit: Payer: Self-pay

## 2021-05-08 ENCOUNTER — Ambulatory Visit (INDEPENDENT_AMBULATORY_CARE_PROVIDER_SITE_OTHER): Payer: BC Managed Care – PPO

## 2021-05-08 VITALS — BP 122/60 | HR 101 | Wt 188.2 lb

## 2021-05-08 DIAGNOSIS — Z01818 Encounter for other preprocedural examination: Secondary | ICD-10-CM

## 2021-05-08 DIAGNOSIS — I491 Atrial premature depolarization: Secondary | ICD-10-CM | POA: Diagnosis not present

## 2021-05-08 DIAGNOSIS — I480 Paroxysmal atrial fibrillation: Secondary | ICD-10-CM | POA: Insufficient documentation

## 2021-05-08 DIAGNOSIS — F1911 Other psychoactive substance abuse, in remission: Secondary | ICD-10-CM | POA: Insufficient documentation

## 2021-05-08 DIAGNOSIS — I5022 Chronic systolic (congestive) heart failure: Secondary | ICD-10-CM | POA: Insufficient documentation

## 2021-05-08 DIAGNOSIS — F17210 Nicotine dependence, cigarettes, uncomplicated: Secondary | ICD-10-CM | POA: Diagnosis not present

## 2021-05-08 DIAGNOSIS — I428 Other cardiomyopathies: Secondary | ICD-10-CM | POA: Insufficient documentation

## 2021-05-08 DIAGNOSIS — Z79899 Other long term (current) drug therapy: Secondary | ICD-10-CM | POA: Diagnosis not present

## 2021-05-08 DIAGNOSIS — Z91199 Patient's noncompliance with other medical treatment and regimen due to unspecified reason: Secondary | ICD-10-CM | POA: Insufficient documentation

## 2021-05-08 DIAGNOSIS — Z955 Presence of coronary angioplasty implant and graft: Secondary | ICD-10-CM | POA: Diagnosis not present

## 2021-05-08 DIAGNOSIS — Z7984 Long term (current) use of oral hypoglycemic drugs: Secondary | ICD-10-CM | POA: Diagnosis not present

## 2021-05-08 DIAGNOSIS — E119 Type 2 diabetes mellitus without complications: Secondary | ICD-10-CM | POA: Diagnosis not present

## 2021-05-08 DIAGNOSIS — I110 Hypertensive heart disease with heart failure: Secondary | ICD-10-CM | POA: Diagnosis not present

## 2021-05-08 DIAGNOSIS — Z7902 Long term (current) use of antithrombotics/antiplatelets: Secondary | ICD-10-CM | POA: Diagnosis not present

## 2021-05-08 DIAGNOSIS — Z95810 Presence of automatic (implantable) cardiac defibrillator: Secondary | ICD-10-CM | POA: Diagnosis not present

## 2021-05-08 DIAGNOSIS — R000 Tachycardia, unspecified: Secondary | ICD-10-CM | POA: Diagnosis not present

## 2021-05-08 DIAGNOSIS — Z8673 Personal history of transient ischemic attack (TIA), and cerebral infarction without residual deficits: Secondary | ICD-10-CM | POA: Insufficient documentation

## 2021-05-08 DIAGNOSIS — I2510 Atherosclerotic heart disease of native coronary artery without angina pectoris: Secondary | ICD-10-CM | POA: Diagnosis not present

## 2021-05-08 DIAGNOSIS — F17200 Nicotine dependence, unspecified, uncomplicated: Secondary | ICD-10-CM

## 2021-05-08 DIAGNOSIS — Z7901 Long term (current) use of anticoagulants: Secondary | ICD-10-CM | POA: Diagnosis not present

## 2021-05-08 DIAGNOSIS — G4733 Obstructive sleep apnea (adult) (pediatric): Secondary | ICD-10-CM | POA: Diagnosis not present

## 2021-05-08 DIAGNOSIS — R9431 Abnormal electrocardiogram [ECG] [EKG]: Secondary | ICD-10-CM | POA: Diagnosis not present

## 2021-05-08 LAB — BASIC METABOLIC PANEL
Anion gap: 7 (ref 5–15)
BUN: 23 mg/dL — ABNORMAL HIGH (ref 6–20)
CO2: 20 mmol/L — ABNORMAL LOW (ref 22–32)
Calcium: 9.3 mg/dL (ref 8.9–10.3)
Chloride: 111 mmol/L (ref 98–111)
Creatinine, Ser: 1.31 mg/dL — ABNORMAL HIGH (ref 0.61–1.24)
GFR, Estimated: >60 mL/min (ref >60)
Glucose, Bld: 110 mg/dL — ABNORMAL HIGH (ref 70–99)
Potassium: 5.5 mmol/L — ABNORMAL HIGH (ref 3.5–5.1)
Sodium: 138 mmol/L (ref 135–145)

## 2021-05-08 MED ORDER — SPIRONOLACTONE 25 MG PO TABS: 12.5000 mg | ORAL_TABLET | Freq: Every day | ORAL | 3 refills | Status: DC

## 2021-05-08 NOTE — Patient Instructions (Signed)
Thank you for coming in today ? ?Labs were done today, if any labs are abnormal the clinic will call you ? ?Your physician recommends that you schedule a follow-up appointment in:  ?3 months with Dr. Aundra Dubin ? ?CONTINUE PLAVIX UNTIL 09/29/2021 THEN STOP ? ?At the [facility], you and your health needs are our priority. As part of our continuing mission to provide you with exceptional heart care, we have created designated Provider Care Teams. These Care Teams include your primary Cardiologist (physician) and Advanced Practice Providers (APPs- Physician Assistants and Nurse Practitioners) who all work together to provide you with the care you need, when you need it.  ? ?You may see any of the following providers on your designated Care Team at your next follow up: ?Dr Glori Bickers ?Dr Loralie Champagne ?Darrick Grinder, NP ?Lyda Jester, PA ?Jessica Milford,NP ?Marlyce Huge, PA ?Audry Riles, PharmD ? ? ?Please be sure to bring in all your medications bottles to every appointment.  ? ?If you have any questions or concerns before your next appointment please send [place] a message through [place] or call our office at [phone].   ? ?TO LEAVE A MESSAGE FOR THE NURSE SELECT OPTION 2, PLEASE LEAVE A MESSAGE INCLUDING: ?YOUR NAME ?DATE OF BIRTH ?CALL BACK NUMBER ?REASON FOR CALL**this is important as we prioritize the call backs ? ?YOU WILL RECEIVE A CALL BACK THE SAME DAY AS LONG AS YOU CALL BEFORE 4:00 PM ? ?

## 2021-05-08 NOTE — Telephone Encounter (Signed)
-----   Message from Rafael Bihari, [place] sent at 05/08/2021 10:21 AM EST ----- ?K elevated. Reduce spiro to 12.5 mg daily. Stop any KCL suppl if taking. Reduce K-rich foods in diet.  ? ?Repeat BMET in 1 week. ?

## 2021-05-08 NOTE — Progress Notes (Signed)
Patient ID: Joseph [name], male   DOB: 05/07/1965, 56 y.o.   MRN: [id number]     Advanced Heart Failure Clinic Note   PCP: Dr. Pilar Plate EP: Dr Caryl Comes  GI: Dr. Benson Norway HF: Dr. Aundra Dubin   HPI: Joseph [name] is a 56 y.o. male with history of chronic systolic HF, nonischemic cardiomyopathy, HTN, TIA, stroke, DM2, paroxysmal atrial fibrillation, tobacco abuse and polysubstance abuse.   Patient had atrial fibrillation noted on ICD interrogation and started apixaban in 1/16.   Echo in 5/18 showed improvement in EF to 50-55% with mild LVH.   Patient was lost to followup for about 4 years and stopped all his medications, it appears. He was seen in the ER in 4/22 with dyspnea/CHF and was started on Lasix.  He was seen by primary care and started back on some of his CHF medications.  Echo in 6/22 showed EF down to 25-30% with mild LV dilation, global hypokinesis, mildly decreased RV systolic function.  LHC/RHC in 7/22 showed severe disease in RCA treated by PCI; filling pressures were normal and CI mildly low at 2.13.   Echo 12/22 EF 30% with diffuse hypokinesis, mild LVH, mildly decreased RV systolic function.   Today he returns for HF follow up. Overall feeling fine. He does not have exertional dyspnea with work duties, walking up stairs or lifting. Denies abnormal bleeding, palpitations, CP, dizziness, edema, or PND/Orthopnea. Appetite ok. No fever or chills. Weight at home 188 pounds. Taking all medications. Smoking but trying to cut back. Takes Lasix every other day. Cannot tolerate CPAP. Works at a [employer] full time.  Device Interrogation (personally reviewed): average HR 105, 3.1 hrs/day of activity. Device rep came to give patient a new transmitter for home.  ECG (personally reviewed): ST w/ PAC  Labs (5/18): K 4.5, creatinine 1.0 Labs (8/18): hgb 13.3, LDL 51, K 3.9, creatinine 2.27 Labs (10/18): K 4.4, creatinine 1.19 Labs (6/22): K 4.7, creatinine 1.4 Labs (8/22): K 4.1, creatinine  1.23 Labs (9/22): LDL 59, HDL 63, K 4.9, creatinine 0.99 Labs (12/22): K 4.6, creatinine 1.28  SH: No drugs as of 06/28/13. Quit smoking in 10/18. No ETOH.   FH: Mother: DM2, living        Father deceased, no issues  Review of systems complete and found to be negative unless listed in HPI.    PMH: 1) HTN 2) NICM: LHC (06/2013): Mild coronary obstructive disease with 30% narrowing in the mid AV groove circumflex, and 30% proximal and 40% mid RCA stenoses with otherwise normal LAD and ramus intermediate vessel.  RHC (06/2013) RA: 6, RV: 26/9, PA: 26/13, PC: a 18 mean 13, CO/CI Fick: 3.7/2.0, left ventriculography EF 15%; ECHO (06/2013) EF 20%, diffuse HK, mild MR; TEE (07/2013) no thrombus; ECHO (10/2013): EF 20-25%, diff HK, LA mod dilated; [employer] ICD implanted (12/01/13).  Echo (4/16) with EF 30-35%, diffuse hypokinesis, normal RV.  - Echo (5/18) with EF 50-55%, mild LVH.  - Echo (6/22) with EF down to 25-30% with mild LV dilation, global hypokinesis, mildly decreased RV systolic function.  - LHC/RHC (8/22): Severe RCA disease treated with PCI (not enough CAD to explain severity of fall in EF); mean RA 1, PA 42/18, mean PCWP 7, CI 2.13.  - Echo (12/22): EF 30% with diffuse hypokinesis, mild LVH, mildly decreased RV systolic function.  3) DM2 4) Stroke: MRI (07/2013) revealed an acute infarction, with a greater than 3 cm infarct involving left frontal opercular cortex and subcortical white  matter. Carotid dopplers (07/2013) mild soft plaque origin ICA. 1-39% ICA stenosis. Vertebral artery flow is antegrade 5) Polysubstance abuse: + cocaine UDS in past.  6) Atrial fibrillation: Paroxysmal, started Eliquis in 1/16.  7) Nephrolithiasis with AKI 8) CAD: LHC in 8/22 with severe RCA disease treated by PCI.   Current Outpatient Medications  Medication Sig Dispense Refill   apixaban (ELIQUIS) 5 MG TABS tablet Take 1 tablet (5 mg total) by mouth 2 (two) times daily. 180 tablet 3   atorvastatin  (LIPITOR) 80 MG tablet Take 1 tablet (80 mg total) by mouth every evening. 90 tablet 3   Blood Glucose Monitoring Suppl (ACCU-CHEK AVIVA CONNECT) w/Device KIT 1 Units by Does not apply route daily. 1 kit 0   carvedilol (COREG) 25 MG tablet Take 1 tablet (25 mg total) by mouth 2 (two) times daily with a meal. 60 tablet 3   clopidogrel (PLAVIX) 75 MG tablet Take 1 tablet (75 mg total) by mouth daily. 30 tablet 4   empagliflozin (JARDIANCE) 10 MG TABS tablet Take 10 mg by mouth daily. 90 tablet 3   furosemide (LASIX) 20 MG tablet Take 1 tablet (20 mg total) by mouth as needed. 30 tablet 11   glucose blood (ACCU-CHEK AVIVA) test strip Use as instructed 100 each 12   insulin glargine (LANTUS SOLOSTAR) 100 UNIT/ML Solostar Pen Inject 18 Units into the skin daily. 15 mL 3   Insulin Syringes, Disposable, U-100 0.3 ML MISC 1 each by Does not apply route daily. 100 each 1   metFORMIN (GLUCOPHAGE) 1000 MG tablet TAKE 1 TABLET BY MOUTH TWICE DAILY WITH MEALS 60 tablet 2   nitroGLYCERIN (NITROSTAT) 0.4 MG SL tablet Place 1 tablet (0.4 mg total) under the tongue every 5 (five) minutes as needed for chest pain. 25 tablet 3   ReliOn Ultra Thin Lancets MISC Patient will need to test his blood glucose once daily prior to breakfast and metformin and amaryl administration. 100 each 2   sacubitril-valsartan (ENTRESTO) 97-103 MG Take 1 tablet by mouth 2 (two) times daily. 60 tablet 6   spironolactone (ALDACTONE) 25 MG tablet Take 1 tablet (25 mg total) by mouth daily. 60 tablet 3   UNIFINE PENTIPS 31G X 6 MM MISC USE AS DIRECTED WITH INSULIN 100 each 0   No current facility-administered medications for this encounter.   Facility-Administered Medications Ordered in Other Encounters  Medication Dose Route Frequency Provider Last Rate Last Admin   sodium chloride flush (NS) 0.9 % injection 3 mL  3 mL Intravenous Q12H Larey Dresser, MD       BP 122/60    Pulse (!) 101    Wt 85.4 kg (188 lb 3.2 oz)    SpO2 97%    BMI  31.32 kg/m   Wt Readings from Last 3 Encounters:  05/08/21 85.4 kg (188 lb 3.2 oz)  03/16/21 87.4 kg (192 lb 9.6 oz)  02/07/21 86.9 kg (191 lb 9.6 oz)    PHYSICAL EXAM: General:  NAD. No resp difficulty HEENT: Normal Neck: Supple. No JVD. Carotids 2+ bilat; no bruits. No lymphadenopathy or thryomegaly appreciated. Cor: PMI nondisplaced. Tachy rate & rhythm. No rubs, gallops or murmurs. Lungs: Clear, diminished in bases. Abdomen: Obese, nontender, nondistended. No hepatosplenomegaly. No bruits or masses. Good bowel sounds. Extremities: No cyanosis, clubbing, rash, edema Neuro: Alert & oriented x 3, cranial nerves grossly intact. Moves all 4 extremities w/o difficulty. Affect pleasant.  ASSESSMENT & PLAN: 1. Chronic systolic HF: Nonischemic cardiomyopathy s/p [place]  [employer] ICD. Narrow QRS so not CRT candidate. Echo 06/2014 showed 30-35% with diffuse hypokinesis. Echo in 5/18 with EF improved to 50-55%. He stopped all his meds and was lost to followup for about 4 years.  Echo in 6/22 showed EF significantly down to 25-30% with mild RV dysfunction.  LHC/RHC in 8/22 showed severe RCA disease treated with PCI; filling pressures were normal and CI mildly decreased at 2.13.  Echo in 12/22 with EF 30%, mildly decreased RV systolic function.  I do not think that he had enough coronary disease to explain the extent of his cardiomyopathy.  NYHA class I-II symptoms.  Not volume overloaded on exam.    - Continue Coreg 25 mg bid.  - Continue spironolactone 25 mg daily.  - Continue Entresto 97/103 bid.  BMET today. - Continue empagliflozin.  - Continue Lasix 20 mg prn.  2. Atrial fibrillation: Paroxysmal, NSR today. No bleeding issues. - Continue Eliquis 5 mg bid. CBC 12/22 stable. 3. OSA: Not wearing CPAP, says he cannot tolerate mask.    - Will send a message to Dr. Theodosia Blender office to see if they can help with his mask. 4. Polysubstance abuse: Has been drug free since 06/2013.  5. Stroke: In 5/15,  likely related to atrial fibrillation. Now on Eliquis.  6. Smoking: Still smoking 1 ppd.  Encouraged cessation. 7. CAD: Severe RCA disease on cath in 8/22, treated with PCI.  As above, I do not think the single vessel disease explains the extent of his cardiomyopathy. No chest pain. - No ASA given Eliquis use.  - Continue Plavix until 09/29/21 then stop (1 year post PCI). Discussed with Dr. Aundra Dubin. - Continue statin, good lipids in 9/22.  8. Pre-Op Clearance: Screening colonoscopy scheduled for 8/23.  He is at an acceptable risk for procedure. OK to hold Eliquis 2 days before procedure. Discussed with Dr. Aundra Dubin. Will forward to Dr. Benson Norway.  Followup 3 months with Dr. Aundra Dubin.  [place], [place]  05/08/2021

## 2021-05-08 NOTE — Telephone Encounter (Signed)
Patient contacted to review results and recommendations per provider.  Medication list updated in [facility] and repeat labwork added to Tuesday March 14th. ?

## 2021-05-09 LAB — CUP PACEART REMOTE DEVICE CHECK
Battery Remaining Longevity: 102 mo
Battery Remaining Percentage: 94 %
Brady Statistic RV Percent Paced: 0 %
Date Time Interrogation Session: 20230307083900
HighPow Impedance: 72 Ohm
Implantable Lead Implant Date: 20150930
Implantable Lead Location: 753860
Implantable Lead Model: 180
Implantable Lead Serial Number: 311831
Implantable Pulse Generator Implant Date: 20150930
Lead Channel Impedance Value: 404 Ohm
Lead Channel Pacing Threshold Amplitude: 0.9 V
Lead Channel Pacing Threshold Pulse Width: 0.4 ms
Lead Channel Setting Pacing Amplitude: 2.4 V
Lead Channel Setting Pacing Pulse Width: 0.4 ms
Lead Channel Setting Sensing Sensitivity: 0.6 mV
Pulse Gen Serial Number: 191604

## 2021-05-15 ENCOUNTER — Other Ambulatory Visit: Payer: Self-pay

## 2021-05-15 ENCOUNTER — Ambulatory Visit (HOSPITAL_COMMUNITY)
Admission: RE | Admit: 2021-05-15 | Discharge: 2021-05-15 | Disposition: A | Discharge status: Home / Self Care | Payer: BC Managed Care – PPO | Source: Ambulatory Visit | Attending: Cardiology | Admitting: Cardiology

## 2021-05-15 DIAGNOSIS — I5022 Chronic systolic (congestive) heart failure: Secondary | ICD-10-CM | POA: Insufficient documentation

## 2021-05-15 LAB — BASIC METABOLIC PANEL
Anion gap: 6 (ref 5–15)
BUN: 30 mg/dL — ABNORMAL HIGH (ref 6–20)
CO2: 25 mmol/L (ref 22–32)
Calcium: 9.8 mg/dL (ref 8.9–10.3)
Chloride: 109 mmol/L (ref 98–111)
Creatinine, Ser: 1.83 mg/dL — ABNORMAL HIGH (ref 0.61–1.24)
GFR, Estimated: 43 mL/min — ABNORMAL LOW (ref >60)
Glucose, Bld: 83 mg/dL (ref 70–99)
Potassium: 5 mmol/L (ref 3.5–5.1)
Sodium: 140 mmol/L (ref 135–145)

## 2021-05-16 ENCOUNTER — Telehealth (HOSPITAL_COMMUNITY): Payer: Self-pay | Admitting: Surgery

## 2021-05-16 NOTE — Telephone Encounter (Signed)
Patient called to review results and recommendations per provider. I left a message for a return call.  ?

## 2021-05-16 NOTE — Telephone Encounter (Signed)
-----   Message from Rafael Bihari, [place] sent at 05/16/2021  8:02 AM EDT ----- ?K improved, but SCr elevated from baseline.  ? ?Please ensure patient is taking Lasix PRN (previously was taking every other day, but needs to only take for 3 lb weight gain/24 hours or 5 lbs/week and/or swelling). ? ?Will need repeat BMET in 3-4 weeks to follow kidney function. ?

## 2021-05-22 NOTE — Progress Notes (Signed)
Remote ICD transmission.   

## 2021-05-23 ENCOUNTER — Telehealth (HOSPITAL_COMMUNITY): Payer: Self-pay | Admitting: Cardiology

## 2021-05-23 DIAGNOSIS — I5022 Chronic systolic (congestive) heart failure: Secondary | ICD-10-CM

## 2021-05-23 NOTE — Telephone Encounter (Signed)
Patient called.  Patient aware.  

## 2021-05-23 NOTE — Telephone Encounter (Signed)
-----   Message from Rafael Bihari, [place] sent at 05/16/2021  8:02 AM EDT ----- ?K improved, but SCr elevated from baseline.  ? ?Please ensure patient is taking Lasix PRN (previously was taking every other day, but needs to only take for 3 lb weight gain/24 hours or 5 lbs/week and/or swelling). ? ?Will need repeat BMET in 3-4 weeks to follow kidney function. ?

## 2021-06-28 ENCOUNTER — Other Ambulatory Visit (HOSPITAL_COMMUNITY): Payer: BC Managed Care – PPO

## 2021-08-07 ENCOUNTER — Ambulatory Visit (INDEPENDENT_AMBULATORY_CARE_PROVIDER_SITE_OTHER): Payer: BC Managed Care – PPO

## 2021-08-07 DIAGNOSIS — I5020 Unspecified systolic (congestive) heart failure: Secondary | ICD-10-CM | POA: Diagnosis not present

## 2021-08-08 ENCOUNTER — Encounter (HOSPITAL_COMMUNITY): Payer: BC Managed Care – PPO | Admitting: Cardiology

## 2021-08-09 ENCOUNTER — Ambulatory Visit (HOSPITAL_COMMUNITY)
Admission: RE | Admit: 2021-08-09 | Discharge: 2021-08-09 | Disposition: A | Discharge status: Home / Self Care | Payer: BC Managed Care – PPO | Source: Ambulatory Visit | Attending: Cardiology | Admitting: Cardiology

## 2021-08-09 ENCOUNTER — Encounter (HOSPITAL_COMMUNITY): Payer: BC Managed Care – PPO | Admitting: Cardiology

## 2021-08-09 ENCOUNTER — Telehealth (HOSPITAL_COMMUNITY): Payer: Self-pay

## 2021-08-09 ENCOUNTER — Encounter (HOSPITAL_COMMUNITY): Payer: Self-pay | Admitting: Cardiology

## 2021-08-09 VITALS — BP 160/90 | HR 80 | Wt 183.0 lb

## 2021-08-09 DIAGNOSIS — F17210 Nicotine dependence, cigarettes, uncomplicated: Secondary | ICD-10-CM | POA: Insufficient documentation

## 2021-08-09 DIAGNOSIS — Z8673 Personal history of transient ischemic attack (TIA), and cerebral infarction without residual deficits: Secondary | ICD-10-CM | POA: Diagnosis not present

## 2021-08-09 DIAGNOSIS — Z955 Presence of coronary angioplasty implant and graft: Secondary | ICD-10-CM | POA: Insufficient documentation

## 2021-08-09 DIAGNOSIS — E875 Hyperkalemia: Secondary | ICD-10-CM | POA: Insufficient documentation

## 2021-08-09 DIAGNOSIS — Z95810 Presence of automatic (implantable) cardiac defibrillator: Secondary | ICD-10-CM | POA: Diagnosis not present

## 2021-08-09 DIAGNOSIS — E785 Hyperlipidemia, unspecified: Secondary | ICD-10-CM | POA: Insufficient documentation

## 2021-08-09 DIAGNOSIS — E782 Mixed hyperlipidemia: Secondary | ICD-10-CM

## 2021-08-09 DIAGNOSIS — Z794 Long term (current) use of insulin: Secondary | ICD-10-CM | POA: Insufficient documentation

## 2021-08-09 DIAGNOSIS — Z7984 Long term (current) use of oral hypoglycemic drugs: Secondary | ICD-10-CM | POA: Insufficient documentation

## 2021-08-09 DIAGNOSIS — G4733 Obstructive sleep apnea (adult) (pediatric): Secondary | ICD-10-CM | POA: Insufficient documentation

## 2021-08-09 DIAGNOSIS — Z9989 Dependence on other enabling machines and devices: Secondary | ICD-10-CM | POA: Insufficient documentation

## 2021-08-09 DIAGNOSIS — N183 Chronic kidney disease, stage 3 unspecified: Secondary | ICD-10-CM | POA: Diagnosis not present

## 2021-08-09 DIAGNOSIS — Z7902 Long term (current) use of antithrombotics/antiplatelets: Secondary | ICD-10-CM | POA: Insufficient documentation

## 2021-08-09 DIAGNOSIS — E1122 Type 2 diabetes mellitus with diabetic chronic kidney disease: Secondary | ICD-10-CM | POA: Insufficient documentation

## 2021-08-09 DIAGNOSIS — Z833 Family history of diabetes mellitus: Secondary | ICD-10-CM | POA: Diagnosis not present

## 2021-08-09 DIAGNOSIS — I5022 Chronic systolic (congestive) heart failure: Secondary | ICD-10-CM | POA: Insufficient documentation

## 2021-08-09 DIAGNOSIS — I2510 Atherosclerotic heart disease of native coronary artery without angina pectoris: Secondary | ICD-10-CM | POA: Insufficient documentation

## 2021-08-09 DIAGNOSIS — Z79899 Other long term (current) drug therapy: Secondary | ICD-10-CM | POA: Diagnosis not present

## 2021-08-09 DIAGNOSIS — F1911 Other psychoactive substance abuse, in remission: Secondary | ICD-10-CM | POA: Insufficient documentation

## 2021-08-09 DIAGNOSIS — I5020 Unspecified systolic (congestive) heart failure: Secondary | ICD-10-CM | POA: Diagnosis not present

## 2021-08-09 DIAGNOSIS — I480 Paroxysmal atrial fibrillation: Secondary | ICD-10-CM | POA: Diagnosis not present

## 2021-08-09 DIAGNOSIS — I130 Hypertensive heart and chronic kidney disease with heart failure and stage 1 through stage 4 chronic kidney disease, or unspecified chronic kidney disease: Secondary | ICD-10-CM | POA: Diagnosis not present

## 2021-08-09 DIAGNOSIS — Z7901 Long term (current) use of anticoagulants: Secondary | ICD-10-CM | POA: Diagnosis not present

## 2021-08-09 LAB — CUP PACEART REMOTE DEVICE CHECK
Battery Remaining Longevity: 102 mo
Battery Remaining Percentage: 91 %
Brady Statistic RV Percent Paced: 0 %
Date Time Interrogation Session: 20230608100400
HighPow Impedance: 69 Ohm
Implantable Lead Implant Date: 20150930
Implantable Lead Location: 753860
Implantable Lead Model: 180
Implantable Lead Serial Number: 311831
Implantable Pulse Generator Implant Date: 20150930
Lead Channel Impedance Value: 396 Ohm
Lead Channel Pacing Threshold Amplitude: 0.9 V
Lead Channel Pacing Threshold Pulse Width: 0.4 ms
Lead Channel Setting Pacing Amplitude: 2.4 V
Lead Channel Setting Pacing Pulse Width: 0.4 ms
Lead Channel Setting Sensing Sensitivity: 0.6 mV
Pulse Gen Serial Number: 191604

## 2021-08-09 LAB — LIPID PANEL
Cholesterol: 168 mg/dL (ref 0–200)
HDL: 92 mg/dL (ref >40)
LDL Cholesterol: 65 mg/dL (ref 0–99)
Total CHOL/HDL Ratio: 1.8 RATIO
Triglycerides: 56 mg/dL (ref <150)
VLDL: 11 mg/dL (ref 0–40)

## 2021-08-09 LAB — BASIC METABOLIC PANEL
Anion gap: 7 (ref 5–15)
BUN: 10 mg/dL (ref 6–20)
CO2: 24 mmol/L (ref 22–32)
Calcium: 8.9 mg/dL (ref 8.9–10.3)
Chloride: 107 mmol/L (ref 98–111)
Creatinine, Ser: 0.98 mg/dL (ref 0.61–1.24)
GFR, Estimated: >60 mL/min (ref >60)
Glucose, Bld: 99 mg/dL (ref 70–99)
Potassium: 5.3 mmol/L — ABNORMAL HIGH (ref 3.5–5.1)
Sodium: 138 mmol/L (ref 135–145)

## 2021-08-09 NOTE — Progress Notes (Signed)
Patient ID: Joseph [name], male   DOB: 09/06/1965, 56 y.o.   MRN: [id number]     Advanced Heart Failure Clinic Note   PCP: Dr. Pilar Plate EP: Dr Caryl Comes  HF: Dr. Aundra Dubin   HPI: Joseph [name] is a 56 y.o. male with history of chronic systolic HF, nonischemic cardiomyopathy, HTN, TIA, stroke, DM2, paroxysmal atrial fibrillation, tobacco abuse and polysubstance abuse.   Patient had atrial fibrillation noted on ICD interrogation and started apixaban in 1/16.   Echo in 5/18 showed improvement in EF to 50-55% with mild LVH.   Patient was lost to followup for about 4 years and stopped all his medications, it appears. He was seen in the ER in 4/22 with dyspnea/CHF and was started on Lasix.  He was seen by primary care and started back on some of his CHF medications.  Echo in 6/22 showed EF down to 25-30% with mild LV dilation, global hypokinesis, mildly decreased RV systolic function.  LHC/RHC in 7/22 showed severe disease in RCA treated by PCI; filling pressures were normal and CI mildly low at 2.13.   Echo in 12/22 showed EF 30% with diffuse hypokinesis, mild LVH, mildly decreased RV systolic function.   He returns for followup of CHF.  He did not take his meds yet today and BP is high.  He says that he normally takes all his meds as ordered.  He is still smoking 1 ppd and is not ready to quit.  He is not short of breath walking up stairs.  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  No orthopnea/PND.  No chest pain. Spironolactone has been decreased with elevated K. He has not used and Lasix.   ECG (personally reviewed): NSR, LAFB  Labs (5/18): K 4.5, creatinine 1.0 Labs (8/18): hgb 13.3, LDL 51, K 3.9, creatinine 2.27 Labs (10/18): K 4.4, creatinine 1.19 Labs (6/22): K 4.7, creatinine 1.4 Labs (8/22): K 4.1, creatinine 1.23 Labs (9/22): LDL 59, HDL 63, K 4.9, creatinine 0.99 Labs (3/23): K 5, creatinine 1.83  SH: No drugs as of 06/28/13. Quit smoking in 10/18. No ETOH.   FH:  Mother: DM2, living        Father deceased, no issues  Review of systems complete and found to be negative unless listed in HPI.    PMH: 1) HTN 2) NICM: LHC (06/2013): Mild coronary obstructive disease with 30% narrowing in the mid AV groove circumflex, and 30% proximal and 40% mid RCA stenoses with otherwise normal LAD and ramus intermediate vessel.  RHC (06/2013) RA: 6, RV: 26/9, PA: 26/13, PC: a 18 mean 13, CO/CI Fick: 3.7/2.0, left ventriculography EF 15%; ECHO (06/2013) EF 20%, diffuse HK, mild MR; TEE (07/2013) no thrombus; ECHO (10/2013): EF 20-25%, diff HK, LA mod dilated; [employer] ICD implanted (12/01/13).  Echo (4/16) with EF 30-35%, diffuse hypokinesis, normal RV.  - Echo (5/18) with EF 50-55%, mild LVH.  - Echo (6/22) with EF down to 25-30% with mild LV dilation, global hypokinesis, mildly decreased RV systolic function.  - LHC/RHC (8/22): Severe RCA disease treated with PCI (not enough CAD to explain severity of fall in EF); mean RA 1, PA 42/18, mean PCWP 7, CI 2.13.  - Echo (12/22): EF 30%, mildly decreased RV systolic function 3) DM2 4) Stroke: MRI (07/2013) revealed an acute infarction, with a greater than 3 cm infarct involving left frontal opercular cortex and subcortical white matter. Carotid dopplers (07/2013) mild soft plaque origin ICA. 1-39%  ICA stenosis. Vertebral artery flow is antegrade 5) Polysubstance abuse: + cocaine UDS in past.  6) Atrial fibrillation: Paroxysmal, started Eliquis in 1/16.  7) Nephrolithiasis with AKI 8) CAD: LHC in 8/22 with severe RCA disease treated by PCI.  9) CKD stage 3   Current Outpatient Medications  Medication Sig Dispense Refill   apixaban (ELIQUIS) 5 MG TABS tablet Take 1 tablet (5 mg total) by mouth 2 (two) times daily. 180 tablet 3   atorvastatin (LIPITOR) 80 MG tablet Take 1 tablet (80 mg total) by mouth every evening. 90 tablet 3   Blood Glucose Monitoring Suppl (ACCU-CHEK AVIVA CONNECT) w/Device KIT 1 Units by Does not apply  route daily. 1 kit 0   carvedilol (COREG) 25 MG tablet Take 1 tablet (25 mg total) by mouth 2 (two) times daily with a meal. 60 tablet 3   empagliflozin (JARDIANCE) 10 MG TABS tablet Take 10 mg by mouth daily. 90 tablet 3   furosemide (LASIX) 20 MG tablet Take 1 tablet (20 mg total) by mouth as needed. 30 tablet 11   glucose blood (ACCU-CHEK AVIVA) test strip Use as instructed 100 each 12   insulin glargine (LANTUS SOLOSTAR) 100 UNIT/ML Solostar Pen Inject 18 Units into the skin daily. 15 mL 3   Insulin Syringes, Disposable, U-100 0.3 ML MISC 1 each by Does not apply route daily. 100 each 1   metFORMIN (GLUCOPHAGE) 1000 MG tablet TAKE 1 TABLET BY MOUTH TWICE DAILY WITH MEALS 60 tablet 2   nitroGLYCERIN (NITROSTAT) 0.4 MG SL tablet Place 1 tablet (0.4 mg total) under the tongue every 5 (five) minutes as needed for chest pain. 25 tablet 3   ReliOn Ultra Thin Lancets MISC Patient will need to test his blood glucose once daily prior to breakfast and metformin and amaryl administration. 100 each 2   sacubitril-valsartan (ENTRESTO) 97-103 MG Take 1 tablet by mouth 2 (two) times daily. 60 tablet 6   spironolactone (ALDACTONE) 25 MG tablet Take 0.5 tablets (12.5 mg total) by mouth daily. 60 tablet 3   UNIFINE PENTIPS 31G X 6 MM MISC USE AS DIRECTED WITH INSULIN 100 each 0   No current facility-administered medications for this encounter.   Facility-Administered Medications Ordered in Other Encounters  Medication Dose Route Frequency Provider Last Rate Last Admin   sodium chloride flush (NS) 0.9 % injection 3 mL  3 mL Intravenous Q12H Larey Dresser, MD         Vitals:   08/09/21 1006  BP: (!) 160/90  Pulse: 80  SpO2: 99%  Weight: 83 kg (183 lb)   Wt Readings from Last 3 Encounters:  08/09/21 83 kg (183 lb)  05/08/21 85.4 kg (188 lb 3.2 oz)  03/16/21 87.4 kg (192 lb 9.6 oz)     PHYSICAL EXAM: General: NAD Neck: No JVD, no thyromegaly or thyroid nodule.  Lungs: Clear to auscultation  bilaterally with normal respiratory effort. CV: Nondisplaced PMI.  Heart regular S1/S2, no S3/S4, no murmur.  No peripheral edema.  No carotid bruit.  Normal pedal pulses.  Abdomen: Soft, nontender, no hepatosplenomegaly, no distention.  Skin: Intact without lesions or rashes.  Neurologic: Alert and oriented x 3.  Psych: Normal affect. Extremities: No clubbing or cyanosis.  HEENT: Normal.   ASSESSMENT & PLAN:  1) Chronic systolic HF: Nonischemic cardiomyopathy s/p [employer] ICD. Narrow QRS so not CRT candidate. Echo 06/2014 showed 30-35% with diffuse hypokinesis. Echo in 5/18 with EF improved to 50-55%. He stopped all his meds  and was lost to followup for about 4 years.  Echo in 6/22 showed EF significantly down to 25-30% with mild RV dysfunction.  LHC/RHC in 8/22 showed severe RCA disease treated with PCI; filling pressures were normal and CI mildly decreased at 2.13.  Echo in 12/22 with EF 30%, mildly decreased RV systolic function.  I do not think that he had enough coronary disease to explain the extent of his cardiomyopathy.  NYHA class I-II symptoms.  Not volume overloaded on exam.    - Continue coreg 25 mg BID.  - Continue spironolactone 12.5 mg daily, dose decreased due to hyperkalemia. BMET today.  - Continue Entresto 97/103 bid.   - Continue empagliflozin.  - Continue Lasix 20 mg prn.  2) Atrial fibrillation: Paroxysmal, NSR today.  - Continue Eliquis 5 mg BID.  3) OSA: Using CPAP.   4) Polysubstance abuse: Has been drug free since 06/2013.  5) Stroke: In 5/15, likely related to atrial fibrillation. Now on Eliquis.  6) Smoking: Still smoking 1 ppd. Not ready to cut back.  7) CAD: Severe RCA disease on cath in 8/22, treated with PCI.  As above, I do not think the single vessel disease explains the extent of his cardiomyopathy.  - No ASA given Eliquis use.  - He will stop Plavix in 8/23.  - Continue statin, check lipids today.  8) HTN: He has not taken any of his meds today.    Asked him to make sure to take all morning meds before coming to clinic appts.   Followup 4 months with APP  Loralie Champagne, MD  08/09/2021

## 2021-08-09 NOTE — Telephone Encounter (Addendum)
Pt aware, agreeable, and verbalized understanding  Labs scheduled   ----- Message from Larey Dresser, MD sent at 08/09/2021  4:32 PM EDT ----- Creatinine better.  Make sure no K supplement and follow low K diet.  BMET again in 2 wks to follow K.

## 2021-08-09 NOTE — Patient Instructions (Signed)
Medication Changes:  Stop plavix  Lab Work:  Labs done today, your results will be available in MyChart, we will contact you for abnormal readings.   Testing/Procedures:  none  Referrals:  none  Special Instructions // Education:  PLEASE TAKE ALL MEDICATIONS AS PRESCRIBED EVEN BEFORE DOCTOR APPOINTMENTS UNLESS INSTRUCTED OTHERWISE  Follow-Up in: 4 months   At the [facility], you and your health needs are our priority. We have a designated team specialized in the treatment of Heart Failure. This Care Team includes your primary Heart Failure Specialized Cardiologist (physician), Advanced Practice Providers (APPs- Physician Assistants and Nurse Practitioners), and Pharmacist who all work together to provide you with the care you need, when you need it.   You may see any of the following providers on your designated Care Team at your next follow up:  Dr Glori Bickers Dr Haynes Kerns, NP Lyda Jester, [place] [facility] [place], [place] Audry Riles, PharmD   Please be sure to bring in all your medications bottles to every appointment.   Need to Contact us:  If you have any questions or concerns before your next appointment please send [place] a message through [place] or call our office at [phone].    TO LEAVE A MESSAGE FOR THE NURSE SELECT OPTION 2, PLEASE LEAVE A MESSAGE INCLUDING: YOUR NAME DATE OF BIRTH CALL BACK NUMBER REASON FOR CALL**this is important as we prioritize the call backs  YOU WILL RECEIVE A CALL BACK THE SAME DAY AS LONG AS YOU CALL BEFORE 4:00 PM

## 2021-08-22 NOTE — Progress Notes (Signed)
Remote ICD transmission.   

## 2021-08-23 ENCOUNTER — Ambulatory Visit (HOSPITAL_COMMUNITY)
Admission: RE | Admit: 2021-08-23 | Discharge: 2021-08-23 | Disposition: A | Discharge status: Home / Self Care | Payer: BC Managed Care – PPO | Source: Ambulatory Visit | Attending: Internal Medicine | Admitting: Internal Medicine

## 2021-08-23 DIAGNOSIS — I5022 Chronic systolic (congestive) heart failure: Secondary | ICD-10-CM | POA: Diagnosis not present

## 2021-08-23 LAB — BASIC METABOLIC PANEL
Anion gap: 12 (ref 5–15)
BUN: 18 mg/dL (ref 6–20)
CO2: 18 mmol/L — ABNORMAL LOW (ref 22–32)
Calcium: 8.9 mg/dL (ref 8.9–10.3)
Chloride: 107 mmol/L (ref 98–111)
Creatinine, Ser: 1.36 mg/dL — ABNORMAL HIGH (ref 0.61–1.24)
GFR, Estimated: >60 mL/min (ref >60)
Glucose, Bld: 127 mg/dL — ABNORMAL HIGH (ref 70–99)
Potassium: 3.8 mmol/L (ref 3.5–5.1)
Sodium: 137 mmol/L (ref 135–145)

## 2021-08-27 ENCOUNTER — Telehealth: Payer: Self-pay

## 2021-08-27 NOTE — Telephone Encounter (Signed)
   Pre-operative Risk Assessment    Patient Name: Joseph [name]  DOB: [birth year] MRN: [id number]      Request for Surgical Clearance    Procedure:   COLONOSCOPY  Date of Surgery:  Clearance 10/19/21                                 Surgeon:  DR. HUNG Surgeon's Group or Practice Name:  [facility], [place] Phone number:  [phone] Fax number:  [phone]   Type of Clearance Requested:   - Pharmacy:  Hold Clopidogrel (Plavix) and Apixaban (Eliquis) NEEDS INSTRUCTIONS    Type of Anesthesia:   PROPOFOL   Additional requests/questions:    Signed, Michaelle Copas   08/27/2021, 3:24 PM

## 2021-08-28 NOTE — Telephone Encounter (Signed)
Reached out to patient, got VM. LMTCB.

## 2021-09-12 NOTE — Telephone Encounter (Signed)
Attempted to contact patient. Left message for patient to call back regarding clearance.

## 2021-09-24 ENCOUNTER — Other Ambulatory Visit: Payer: Self-pay

## 2021-09-24 MED ORDER — APIXABAN 5 MG PO TABS
5.0000 mg | ORAL_TABLET | Freq: Two times a day (BID) | ORAL | 3 refills | Status: DC
Start: 2021-09-24 — End: 2022-11-07

## 2021-10-01 NOTE — Telephone Encounter (Signed)
Contacted patient on 10/01/2021.  Recommendations for holding Plavix and continuing Eliquis reviewed.  Patient expressed understanding.  He reported that he had not been able to pick up refill of Eliquis last week.  Medications reviewed and apixaban was reordered 09/24/2021.  Instructed patient to contact pharmacy.  I will remove patient from preoperative goal at this time.  Jossie Ng. Emigdio Wildeman NP-C     10/01/2021, 11:34 AM [place] Office [phone] Fax [phone]

## 2021-10-11 ENCOUNTER — Encounter (HOSPITAL_COMMUNITY): Payer: Self-pay | Admitting: Gastroenterology

## 2021-10-18 ENCOUNTER — Encounter (HOSPITAL_COMMUNITY): Payer: Self-pay | Admitting: Certified Registered"

## 2021-10-19 ENCOUNTER — Ambulatory Visit (HOSPITAL_COMMUNITY)
Admission: RE | Admit: 2021-10-19 | Payer: BC Managed Care – PPO | Source: Home / Self Care | Admitting: Gastroenterology

## 2021-10-19 SURGERY — COLONOSCOPY WITH PROPOFOL
Anesthesia: Monitor Anesthesia Care

## 2021-10-19 NOTE — Progress Notes (Signed)
Called patient  no answer and left voice mail x2 at [phone] and 0710 Asked patient to call and let us know if he was coming. At 0800 have not heard from patient we are taking him off schedule as  a no show

## 2021-10-19 NOTE — Anesthesia Preprocedure Evaluation (Deleted)
Anesthesia Evaluation    Reviewed: Allergy & Precautions, Patient's Chart, lab work & pertinent test results  Airway        Dental   Pulmonary sleep apnea and Continuous Positive Airway Pressure Ventilation , Current Smoker and Patient abstained from smoking.,           Cardiovascular hypertension, Pt. on home beta blockers and Pt. on medications + CAD, + Cardiac Stents and +CHF  + dysrhythmias + Cardiac Defibrillator      Neuro/Psych CVA    GI/Hepatic negative GI ROS, (+)     substance abuse  ,   Endo/Other  diabetes, Insulin Dependent, Oral Hypoglycemic Agents  Renal/GU negative Renal ROS     Musculoskeletal negative musculoskeletal ROS (+)   Abdominal   Peds  Hematology  (+) Blood dyscrasia, ,   Anesthesia Other Findings   Reproductive/Obstetrics                             Anesthesia Physical Anesthesia Plan  ASA: 3  Anesthesia Plan:    Post-op Pain Management:    Induction:   PONV Risk Score and Plan:   Airway Management Planned:   Additional Equipment:   Intra-op Plan:   Post-operative Plan:   Informed Consent:   Plan Discussed with:   Anesthesia Plan Comments:         Anesthesia Quick Evaluation

## 2021-11-09 ENCOUNTER — Other Ambulatory Visit (HOSPITAL_COMMUNITY): Payer: Self-pay | Admitting: Family Medicine

## 2021-11-09 ENCOUNTER — Other Ambulatory Visit: Payer: Self-pay | Admitting: Family Medicine

## 2021-11-16 ENCOUNTER — Other Ambulatory Visit (HOSPITAL_COMMUNITY): Payer: Self-pay | Admitting: Cardiology

## 2021-11-16 MED ORDER — FUROSEMIDE 20 MG PO TABS: 20.0000 mg | ORAL_TABLET | ORAL | 3 refills | Status: DC | PRN

## 2021-12-06 NOTE — Progress Notes (Addendum)
Patient ID: Joseph [name], male   DOB: 04-12-1965, 56 y.o.   MRN: [id number]     Advanced Heart Failure Clinic Note   PCP: [facility] EP: Dr Caryl Comes  HF: Dr. Aundra Dubin   HPI: Joseph [name] is a 56 y.o. male with history of chronic systolic HF, nonischemic cardiomyopathy, HTN, TIA, stroke, DM2, paroxysmal atrial fibrillation, tobacco abuse and polysubstance abuse.   Patient had atrial fibrillation noted on ICD interrogation and started apixaban in 1/16.   Echo in 5/18 showed improvement in EF to 50-55% with mild LVH.   Patient was lost to followup for about 4 years and stopped all his medications, it appears. He was seen in the ER in 4/22 with dyspnea/CHF and was started on Lasix.  He was seen by primary care and started back on some of his CHF medications.  Echo in 6/22 showed EF down to 25-30% with mild LV dilation, global hypokinesis, mildly decreased RV systolic function.  LHC/RHC in 7/22 showed severe disease in RCA treated by PCI; filling pressures were normal and CI mildly low at 2.13.   Echo in 12/22 showed EF 30% with diffuse hypokinesis, mild LVH, mildly decreased RV systolic function.   Patient is here today for 4 month HF f/u. He has been feeling well. 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 No dyspnea, orthopnea, PND or LE edema. Has been taking furosemide every other day for a couple of weeks. Does not have any concerns for fluid retention.   Continues to smoke 1 ppd. Not ready to quit.  Reports having trouble with remote transmitter and requesting new one.    Labs (5/18): K 4.5, creatinine 1.0 Labs (8/18): hgb 13.3, LDL 51, K 3.9, creatinine 2.27 Labs (10/18): K 4.4, creatinine 1.19 Labs (6/22): K 4.7, creatinine 1.4 Labs (8/22): K 4.1, creatinine 1.23 Labs (9/22): LDL 59, HDL 63, K 4.9, creatinine 0.99 Labs (3/23): K 5, creatinine 1.83 Labs (06/23) LDL 65, K 3.8, Scr 1.36  SH: No drugs as of 06/28/13. Quit smoking in 10/18. No ETOH.    FH: Mother: DM2, living        Father deceased, no issues  Review of systems complete and found to be negative unless listed in HPI.    PMH: 1) HTN 2) NICM: LHC (06/2013): Mild coronary obstructive disease with 30% narrowing in the mid AV groove circumflex, and 30% proximal and 40% mid RCA stenoses with otherwise normal LAD and ramus intermediate vessel.  RHC (06/2013) RA: 6, RV: 26/9, PA: 26/13, PC: a 18 mean 13, CO/CI Fick: 3.7/2.0, left ventriculography EF 15%; ECHO (06/2013) EF 20%, diffuse HK, mild MR; TEE (07/2013) no thrombus; ECHO (10/2013): EF 20-25%, diff HK, LA mod dilated; [employer] ICD implanted (12/01/13).  Echo (4/16) with EF 30-35%, diffuse hypokinesis, normal RV.  - Echo (5/18) with EF 50-55%, mild LVH.  - Echo (6/22) with EF down to 25-30% with mild LV dilation, global hypokinesis, mildly decreased RV systolic function.  - LHC/RHC (8/22): Severe RCA disease treated with PCI (not enough CAD to explain severity of fall in EF); mean RA 1, PA 42/18, mean PCWP 7, CI 2.13.  - Echo (12/22): EF 30%, mildly decreased RV systolic function 3) DM2 4) Stroke: MRI (07/2013) revealed an acute infarction, with a greater than 3 cm infarct involving left frontal opercular cortex and subcortical white matter. Carotid dopplers (07/2013) mild soft plaque origin ICA. 1-39% ICA stenosis. Vertebral artery flow is antegrade 5) Polysubstance  abuse: + cocaine UDS in past.  6) Atrial fibrillation: Paroxysmal, started Eliquis in 1/16.  7) Nephrolithiasis with AKI 8) CAD: LHC in 8/22 with severe RCA disease treated by PCI.  9) CKD stage 3   Current Outpatient Medications  Medication Sig Dispense Refill   apixaban (ELIQUIS) 5 MG TABS tablet Take 1 tablet (5 mg total) by mouth 2 (two) times daily. 180 tablet 3   atorvastatin (LIPITOR) 80 MG tablet Take 1 tablet (80 mg total) by mouth every evening. 90 tablet 3   Blood Glucose Monitoring Suppl (ACCU-CHEK AVIVA CONNECT) w/Device KIT 1 Units by Does not  apply route daily. 1 kit 0   carvedilol (COREG) 25 MG tablet TAKE 1 TABLET BY MOUTH TWICE DAILY WITH A MEAL 60 tablet 3   empagliflozin (JARDIANCE) 10 MG TABS tablet Take 10 mg by mouth daily. 90 tablet 3   furosemide (LASIX) 20 MG tablet Take 1 tablet (20 mg total) by mouth as needed. 30 tablet 3   glucose blood (ACCU-CHEK AVIVA) test strip Use as instructed 100 each 12   insulin glargine (LANTUS SOLOSTAR) 100 UNIT/ML Solostar Pen Inject 18 Units into the skin daily. 15 mL 3   insulin glargine-yfgn (SEMGLEE, YFGN,) 100 UNIT/ML injection Inject 18 Units into the skin daily.     Insulin Syringes, Disposable, U-100 0.3 ML MISC 1 each by Does not apply route daily. 100 each 1   metFORMIN (GLUCOPHAGE) 1000 MG tablet TAKE 1 TABLET BY MOUTH TWICE DAILY WITH MEALS 60 tablet 2   nitroGLYCERIN (NITROSTAT) 0.4 MG SL tablet Place 1 tablet (0.4 mg total) under the tongue every 5 (five) minutes as needed for chest pain. 25 tablet 3   ReliOn Ultra Thin Lancets MISC Patient will need to test his blood glucose once daily prior to breakfast and metformin and amaryl administration. 100 each 2   sacubitril-valsartan (ENTRESTO) 97-103 MG Take 1 tablet by mouth 2 (two) times daily. 60 tablet 6   spironolactone (ALDACTONE) 25 MG tablet Take 25 mg by mouth daily.     UNIFINE PENTIPS 31G X 6 MM MISC USE AS DIRECTED WITH INSULIN 100 each 0   No current facility-administered medications for this encounter.   Facility-Administered Medications Ordered in Other Encounters  Medication Dose Route Frequency Provider Last Rate Last Admin   sodium chloride flush (NS) 0.9 % injection 3 mL  3 mL Intravenous Q12H Larey Dresser, MD         Vitals:   12/10/21 0900  BP: 130/72  Pulse: 93  SpO2: 98%  Weight: 81.6 kg (180 lb)    Wt Readings from Last 3 Encounters:  12/10/21 81.6 kg (180 lb)  08/09/21 83 kg (183 lb)  05/08/21 85.4 kg (188 lb 3.2 oz)     PHYSICAL EXAM: General:  Well appearing.  HEENT: normal Neck:  supple. no JVD. Carotids 2+ bilat; no bruits.  Cor: PMI nondisplaced. Regular rate & rhythm. No rubs, gallops or murmurs. Lungs: clear Abdomen: soft, nontender, nondistended.  Extremities: no cyanosis, clubbing, rash, edema Neuro: alert & orientedx3, cranial nerves grossly intact. moves all 4 extremities w/o difficulty. Affect pleasant  [employer] device interrogation: no VT/VF, activity ~ 1 hr/day, no HL index data   ASSESSMENT & PLAN:  1) Chronic systolic HF: Nonischemic cardiomyopathy s/p [employer] ICD. Narrow QRS so not CRT candidate. Echo 06/2014 showed 30-35% with diffuse hypokinesis. Echo in 5/18 with EF improved to 50-55%. He stopped all his meds and was lost to followup for  about 4 years.  Echo in 6/22 showed EF significantly down to 25-30% with mild RV dysfunction.  LHC/RHC in 8/22 showed severe RCA disease treated with PCI; filling pressures were normal and CI mildly decreased at 2.13.  Echo in 12/22 with EF 30%, mildly decreased RV systolic function.  I do not think that he had enough coronary disease to explain the extent of his cardiomyopathy.  NYHA class I-II symptoms.   - Volume looks good on exam. ReDS 40% (doubt accurate). HL unavailable on [employer] interrogation. Has been taking 20 mg lasix every other day (ordered PRN). Check labs today, BMET/BNP - Continue coreg 25 mg BID.  - Continue spiro 25 daily. Dose previously reduced to 12.5 mg daily d/t hyperkalemia, uncertain how long has been back on higher dose. Need to watch potassium. - Continue Entresto 97/103 bid.   - Continue empagliflozin.  - BMET/BNP today - Repeat echo at next visit to reassess LV function - [employer] rep assisting patient with obtaining new remote transmitter 2) Atrial fibrillation: Paroxysmal, regular rhythm on exam today - [employer] rep faxing over device transmission - Continue Eliquis 5 mg BID.  3) OSA: Using CPAP.   4) Polysubstance abuse: Has been drug free since 06/2013.  5) Stroke:  In 5/15, likely related to atrial fibrillation. Now on Eliquis. CBC at next f/u.  6) Smoking: Still smoking 1 ppd. Not ready to quit. 7) CAD: Severe RCA disease on cath in 8/22, treated with PCI.  As above, I do not think the single vessel disease explains the extent of his cardiomyopathy.  - No ASA given Eliquis use.  - Continue statin, LDL at goal 06/23 8) HTN: BP stable. - Meds as above 9) DM II: On insulin - Continue empagliflozin - Last A1c 5.6% in January 23  F/u: 4 months with APP with echo  [facility], Dustie Brittle N, PA-C  12/10/2021

## 2021-12-10 ENCOUNTER — Other Ambulatory Visit (HOSPITAL_COMMUNITY): Payer: Self-pay | Admitting: *Deleted

## 2021-12-10 ENCOUNTER — Ambulatory Visit (HOSPITAL_COMMUNITY)
Admission: RE | Admit: 2021-12-10 | Discharge: 2021-12-10 | Disposition: A | Discharge status: Home / Self Care | Payer: BC Managed Care – PPO | Source: Ambulatory Visit | Attending: Physician Assistant | Admitting: Physician Assistant

## 2021-12-10 ENCOUNTER — Ambulatory Visit (INDEPENDENT_AMBULATORY_CARE_PROVIDER_SITE_OTHER): Payer: BC Managed Care – PPO

## 2021-12-10 ENCOUNTER — Telehealth (HOSPITAL_COMMUNITY): Payer: Self-pay | Admitting: *Deleted

## 2021-12-10 ENCOUNTER — Encounter (HOSPITAL_COMMUNITY): Payer: Self-pay

## 2021-12-10 VITALS — BP 130/72 | HR 93 | Wt 180.0 lb

## 2021-12-10 DIAGNOSIS — E875 Hyperkalemia: Secondary | ICD-10-CM | POA: Insufficient documentation

## 2021-12-10 DIAGNOSIS — I428 Other cardiomyopathies: Secondary | ICD-10-CM

## 2021-12-10 DIAGNOSIS — Z7984 Long term (current) use of oral hypoglycemic drugs: Secondary | ICD-10-CM | POA: Diagnosis not present

## 2021-12-10 DIAGNOSIS — Z7901 Long term (current) use of anticoagulants: Secondary | ICD-10-CM | POA: Diagnosis not present

## 2021-12-10 DIAGNOSIS — I5022 Chronic systolic (congestive) heart failure: Secondary | ICD-10-CM

## 2021-12-10 DIAGNOSIS — Z955 Presence of coronary angioplasty implant and graft: Secondary | ICD-10-CM | POA: Insufficient documentation

## 2021-12-10 DIAGNOSIS — Z833 Family history of diabetes mellitus: Secondary | ICD-10-CM | POA: Diagnosis not present

## 2021-12-10 DIAGNOSIS — Z8673 Personal history of transient ischemic attack (TIA), and cerebral infarction without residual deficits: Secondary | ICD-10-CM | POA: Diagnosis not present

## 2021-12-10 DIAGNOSIS — G4733 Obstructive sleep apnea (adult) (pediatric): Secondary | ICD-10-CM | POA: Diagnosis not present

## 2021-12-10 DIAGNOSIS — I2510 Atherosclerotic heart disease of native coronary artery without angina pectoris: Secondary | ICD-10-CM

## 2021-12-10 DIAGNOSIS — F17210 Nicotine dependence, cigarettes, uncomplicated: Secondary | ICD-10-CM | POA: Insufficient documentation

## 2021-12-10 DIAGNOSIS — F17200 Nicotine dependence, unspecified, uncomplicated: Secondary | ICD-10-CM | POA: Diagnosis not present

## 2021-12-10 DIAGNOSIS — I10 Essential (primary) hypertension: Secondary | ICD-10-CM

## 2021-12-10 DIAGNOSIS — Z79899 Other long term (current) drug therapy: Secondary | ICD-10-CM | POA: Diagnosis not present

## 2021-12-10 DIAGNOSIS — I480 Paroxysmal atrial fibrillation: Secondary | ICD-10-CM | POA: Insufficient documentation

## 2021-12-10 DIAGNOSIS — I130 Hypertensive heart and chronic kidney disease with heart failure and stage 1 through stage 4 chronic kidney disease, or unspecified chronic kidney disease: Secondary | ICD-10-CM | POA: Insufficient documentation

## 2021-12-10 DIAGNOSIS — E1122 Type 2 diabetes mellitus with diabetic chronic kidney disease: Secondary | ICD-10-CM | POA: Insufficient documentation

## 2021-12-10 LAB — BASIC METABOLIC PANEL
Anion gap: 7 (ref 5–15)
BUN: 15 mg/dL (ref 6–20)
CO2: 22 mmol/L (ref 22–32)
Calcium: 9 mg/dL (ref 8.9–10.3)
Chloride: 108 mmol/L (ref 98–111)
Creatinine, Ser: 1.24 mg/dL (ref 0.61–1.24)
GFR, Estimated: >60 mL/min (ref >60)
Glucose, Bld: 262 mg/dL — ABNORMAL HIGH (ref 70–99)
Potassium: 5.2 mmol/L — ABNORMAL HIGH (ref 3.5–5.1)
Sodium: 137 mmol/L (ref 135–145)

## 2021-12-10 LAB — BRAIN NATRIURETIC PEPTIDE: B Natriuretic Peptide: 39.3 pg/mL (ref 0.0–100.0)

## 2021-12-10 NOTE — Telephone Encounter (Signed)
Called patient per Jeneen Rinks, PA with following: 1. BNP normal, no evidence of fluid build up. Potassium elevated at 5.2.  2. Confirmed patient is taking Spiro 25 mg daily - instructed him to decrease to 12.5 mg daily. 3. Repeat lab scheduled for one week (pt may or may not be able to make it depending on his work schedule).

## 2021-12-10 NOTE — Addendum Note (Signed)
Encounter addended by: Joette Catching, PA-C on: 12/10/2021 11:30 AM  Actions taken: Clinical Note Signed, Result note filed

## 2021-12-10 NOTE — Addendum Note (Signed)
Encounter addended by: Rockwell Alexandria, CMA on: 12/10/2021 9:42 AM  Actions taken: Flowsheet accepted, Clinical Note Signed

## 2021-12-10 NOTE — Addendum Note (Signed)
Encounter addended by: Joette Catching, PA-C on: 12/10/2021 4:37 PM  Actions taken: Clinical Note Signed

## 2021-12-10 NOTE — Patient Instructions (Addendum)
Thank you for coming in today  Labs were done today, if any labs are abnormal the clinic will call you No news is good news  Your physician recommends that you schedule a follow-up appointment in:  4 months with echocardiogram in clinic  Your physician has requested that you have an echocardiogram. Echocardiography is a painless test that uses sound waves to create images of your heart. It provides your doctor with information about the size and shape of your heart and how well your heart's chambers and valves are working. This procedure takes approximately one hour. There are no restrictions for this procedure.     Do the following things EVERYDAY: Weigh yourself in the morning before breakfast. Write it down and keep it in a log. Take your medicines as prescribed Eat low salt foods--Limit salt (sodium) to 2000 mg per day.  Stay as active as you can everyday Limit all fluids for the day to less than 2 liters  At the [facility], you and your health needs are our priority. As part of our continuing mission to provide you with exceptional heart care, we have created designated Provider Care Teams. These Care Teams include your primary Cardiologist (physician) and Advanced Practice Providers (APPs- Physician Assistants and Nurse Practitioners) who all work together to provide you with the care you need, when you need it.   You may see any of the following providers on your designated Care Team at your next follow up: Dr Glori Bickers Dr Loralie Champagne Dr. Roxana Hires, NP Lyda Jester, [place] [facility] [place], [place] Forestine Na, NP Audry Riles, PharmD   Please be sure to bring in all your medications bottles to every appointment.   If you have any questions or concerns before your next appointment please send [place] a message through [place] or call our office at [phone].    TO LEAVE A MESSAGE FOR THE NURSE SELECT OPTION 2, PLEASE LEAVE  A MESSAGE INCLUDING: YOUR NAME DATE OF BIRTH CALL BACK NUMBER REASON FOR CALL**this is important as we prioritize the call backs  YOU WILL RECEIVE A CALL BACK THE SAME DAY AS LONG AS YOU CALL BEFORE 4:00 PM

## 2021-12-10 NOTE — Progress Notes (Signed)
ReDS Vest / Clip - 12/10/21 0900       ReDS Vest / Clip   Station Marker A    Ruler Value 28    ReDS Value Range High volume overload    ReDS Actual Value 45

## 2021-12-11 LAB — CUP PACEART REMOTE DEVICE CHECK
Battery Remaining Longevity: 96 mo
Battery Remaining Percentage: 87 %
Brady Statistic RV Percent Paced: 0 %
Date Time Interrogation Session: 20231009085600
HighPow Impedance: 64 Ohm
Implantable Lead Implant Date: 20150930
Implantable Lead Location: 753860
Implantable Lead Model: 180
Implantable Lead Serial Number: 311831
Implantable Pulse Generator Implant Date: 20150930
Lead Channel Impedance Value: 394 Ohm
Lead Channel Pacing Threshold Amplitude: 0.9 V
Lead Channel Pacing Threshold Pulse Width: 0.4 ms
Lead Channel Setting Pacing Amplitude: 2.4 V
Lead Channel Setting Pacing Pulse Width: 0.4 ms
Lead Channel Setting Sensing Sensitivity: 0.6 mV
Pulse Gen Serial Number: 191604

## 2021-12-19 ENCOUNTER — Other Ambulatory Visit (HOSPITAL_COMMUNITY): Payer: BC Managed Care – PPO

## 2021-12-20 ENCOUNTER — Other Ambulatory Visit (HOSPITAL_COMMUNITY): Payer: BC Managed Care – PPO

## 2021-12-20 ENCOUNTER — Ambulatory Visit (HOSPITAL_COMMUNITY)
Admission: RE | Admit: 2021-12-20 | Discharge: 2021-12-20 | Disposition: A | Discharge status: Home / Self Care | Payer: BC Managed Care – PPO | Source: Ambulatory Visit | Attending: Cardiology | Admitting: Cardiology

## 2021-12-20 DIAGNOSIS — I5022 Chronic systolic (congestive) heart failure: Secondary | ICD-10-CM | POA: Diagnosis not present

## 2021-12-20 LAB — BASIC METABOLIC PANEL
Anion gap: 10 (ref 5–15)
BUN: 23 mg/dL — ABNORMAL HIGH (ref 6–20)
CO2: 23 mmol/L (ref 22–32)
Calcium: 9.5 mg/dL (ref 8.9–10.3)
Chloride: 108 mmol/L (ref 98–111)
Creatinine, Ser: 1.33 mg/dL — ABNORMAL HIGH (ref 0.61–1.24)
GFR, Estimated: >60 mL/min (ref >60)
Glucose, Bld: 71 mg/dL (ref 70–99)
Potassium: 4.2 mmol/L (ref 3.5–5.1)
Sodium: 141 mmol/L (ref 135–145)

## 2021-12-25 ENCOUNTER — Other Ambulatory Visit: Payer: Self-pay | Admitting: Family Medicine

## 2022-01-11 NOTE — Progress Notes (Signed)
Remote ICD transmission.   

## 2022-02-13 ENCOUNTER — Other Ambulatory Visit (HOSPITAL_COMMUNITY): Payer: Self-pay | Admitting: Cardiology

## 2022-02-13 ENCOUNTER — Other Ambulatory Visit: Payer: Self-pay | Admitting: Family Medicine

## 2022-04-10 ENCOUNTER — Other Ambulatory Visit (HOSPITAL_COMMUNITY): Payer: Self-pay | Admitting: Cardiology

## 2022-06-03 ENCOUNTER — Other Ambulatory Visit (HOSPITAL_COMMUNITY): Payer: Self-pay | Admitting: Cardiology

## 2022-06-03 ENCOUNTER — Other Ambulatory Visit: Payer: Self-pay | Admitting: Family Medicine

## 2022-08-02 ENCOUNTER — Other Ambulatory Visit: Payer: Self-pay | Admitting: Family Medicine

## 2022-08-02 ENCOUNTER — Other Ambulatory Visit (HOSPITAL_COMMUNITY): Payer: Self-pay | Admitting: Cardiology

## 2022-08-15 ENCOUNTER — Other Ambulatory Visit (HOSPITAL_COMMUNITY): Payer: Self-pay | Admitting: Cardiology

## 2022-08-15 DIAGNOSIS — I5022 Chronic systolic (congestive) heart failure: Secondary | ICD-10-CM

## 2022-11-07 ENCOUNTER — Ambulatory Visit (HOSPITAL_BASED_OUTPATIENT_CLINIC_OR_DEPARTMENT_OTHER)
Admission: RE | Admit: 2022-11-07 | Discharge: 2022-11-07 | Disposition: A | Discharge status: Home / Self Care | Payer: BC Managed Care – PPO | Source: Ambulatory Visit | Attending: Cardiology | Admitting: Cardiology

## 2022-11-07 ENCOUNTER — Other Ambulatory Visit: Payer: Self-pay

## 2022-11-07 ENCOUNTER — Ambulatory Visit (HOSPITAL_COMMUNITY)
Admission: RE | Admit: 2022-11-07 | Discharge: 2022-11-07 | Disposition: A | Discharge status: Home / Self Care | Payer: BC Managed Care – PPO | Source: Ambulatory Visit | Attending: Internal Medicine | Admitting: Internal Medicine

## 2022-11-07 ENCOUNTER — Ambulatory Visit: Payer: BC Managed Care – PPO | Admitting: Family Medicine

## 2022-11-07 ENCOUNTER — Encounter: Payer: Self-pay | Admitting: Family Medicine

## 2022-11-07 ENCOUNTER — Encounter (HOSPITAL_COMMUNITY): Payer: Self-pay | Admitting: Cardiology

## 2022-11-07 VITALS — BP 144/88 | HR 98 | Ht 65.0 in | Wt 182.0 lb

## 2022-11-07 VITALS — BP 140/80 | HR 90 | Wt 182.6 lb

## 2022-11-07 DIAGNOSIS — I5022 Chronic systolic (congestive) heart failure: Secondary | ICD-10-CM | POA: Diagnosis not present

## 2022-11-07 DIAGNOSIS — E785 Hyperlipidemia, unspecified: Secondary | ICD-10-CM

## 2022-11-07 DIAGNOSIS — I10 Essential (primary) hypertension: Secondary | ICD-10-CM

## 2022-11-07 DIAGNOSIS — I2510 Atherosclerotic heart disease of native coronary artery without angina pectoris: Secondary | ICD-10-CM | POA: Insufficient documentation

## 2022-11-07 DIAGNOSIS — N183 Chronic kidney disease, stage 3 unspecified: Secondary | ICD-10-CM | POA: Insufficient documentation

## 2022-11-07 DIAGNOSIS — I130 Hypertensive heart and chronic kidney disease with heart failure and stage 1 through stage 4 chronic kidney disease, or unspecified chronic kidney disease: Secondary | ICD-10-CM | POA: Diagnosis not present

## 2022-11-07 DIAGNOSIS — Z8673 Personal history of transient ischemic attack (TIA), and cerebral infarction without residual deficits: Secondary | ICD-10-CM | POA: Diagnosis not present

## 2022-11-07 DIAGNOSIS — I480 Paroxysmal atrial fibrillation: Secondary | ICD-10-CM

## 2022-11-07 DIAGNOSIS — Z794 Long term (current) use of insulin: Secondary | ICD-10-CM | POA: Diagnosis not present

## 2022-11-07 DIAGNOSIS — E1122 Type 2 diabetes mellitus with diabetic chronic kidney disease: Secondary | ICD-10-CM | POA: Diagnosis not present

## 2022-11-07 DIAGNOSIS — G4733 Obstructive sleep apnea (adult) (pediatric): Secondary | ICD-10-CM

## 2022-11-07 DIAGNOSIS — I428 Other cardiomyopathies: Secondary | ICD-10-CM | POA: Diagnosis not present

## 2022-11-07 DIAGNOSIS — F17210 Nicotine dependence, cigarettes, uncomplicated: Secondary | ICD-10-CM | POA: Diagnosis not present

## 2022-11-07 DIAGNOSIS — Z955 Presence of coronary angioplasty implant and graft: Secondary | ICD-10-CM | POA: Insufficient documentation

## 2022-11-07 DIAGNOSIS — Z7901 Long term (current) use of anticoagulants: Secondary | ICD-10-CM | POA: Diagnosis not present

## 2022-11-07 DIAGNOSIS — IMO0001 Reserved for inherently not codable concepts without codable children: Secondary | ICD-10-CM

## 2022-11-07 DIAGNOSIS — I255 Ischemic cardiomyopathy: Secondary | ICD-10-CM | POA: Diagnosis not present

## 2022-11-07 DIAGNOSIS — I509 Heart failure, unspecified: Secondary | ICD-10-CM | POA: Diagnosis not present

## 2022-11-07 DIAGNOSIS — F17200 Nicotine dependence, unspecified, uncomplicated: Secondary | ICD-10-CM

## 2022-11-07 DIAGNOSIS — Z79899 Other long term (current) drug therapy: Secondary | ICD-10-CM | POA: Diagnosis not present

## 2022-11-07 DIAGNOSIS — E119 Type 2 diabetes mellitus without complications: Secondary | ICD-10-CM

## 2022-11-07 LAB — ECHOCARDIOGRAM COMPLETE
AR max vel: 2.67 cm2
AV Area VTI: 2.68 cm2
AV Area mean vel: 2.55 cm2
AV Mean grad: 5 mmHg
AV Peak grad: 8.9 mmHg
Ao pk vel: 1.49 m/s
Calc EF: 54.9 %
Est EF: 50
S' Lateral: 4.1 cm
Single Plane A2C EF: 49.9 %
Single Plane A4C EF: 56.1 %

## 2022-11-07 LAB — BASIC METABOLIC PANEL
Anion gap: 9 (ref 5–15)
BUN: 14 mg/dL (ref 6–20)
CO2: 21 mmol/L — ABNORMAL LOW (ref 22–32)
Calcium: 9.3 mg/dL (ref 8.9–10.3)
Chloride: 109 mmol/L (ref 98–111)
Creatinine, Ser: 1.15 mg/dL (ref 0.61–1.24)
GFR, Estimated: >60 mL/min (ref >60)
Glucose, Bld: 97 mg/dL (ref 70–99)
Potassium: 4.7 mmol/L (ref 3.5–5.1)
Sodium: 139 mmol/L (ref 135–145)

## 2022-11-07 LAB — LIPID PANEL
Cholesterol: 152 mg/dL (ref 0–200)
HDL: 92 mg/dL (ref >40)
LDL Cholesterol: 39 mg/dL (ref 0–99)
Total CHOL/HDL Ratio: 1.7 ratio
Triglycerides: 103 mg/dL (ref <150)
VLDL: 21 mg/dL (ref 0–40)

## 2022-11-07 LAB — CBC
HCT: 44.9 % (ref 39.0–52.0)
Hemoglobin: 14.8 g/dL (ref 13.0–17.0)
MCH: 33 pg (ref 26.0–34.0)
MCHC: 33 g/dL (ref 30.0–36.0)
MCV: 100.2 fL — ABNORMAL HIGH (ref 80.0–100.0)
Platelets: 201 10*3/uL (ref 150–400)
RBC: 4.48 MIL/uL (ref 4.22–5.81)
RDW: 15.3 % (ref 11.5–15.5)
WBC: 7.6 10*3/uL (ref 4.0–10.5)
nRBC: 0 % (ref 0.0–0.2)

## 2022-11-07 LAB — POCT GLYCOSYLATED HEMOGLOBIN (HGB A1C): HbA1c, POC (controlled diabetic range): 6 % (ref 0.0–7.0)

## 2022-11-07 LAB — BRAIN NATRIURETIC PEPTIDE: B Natriuretic Peptide: 39.7 pg/mL (ref 0.0–100.0)

## 2022-11-07 MED ORDER — CARVEDILOL 25 MG PO TABS: 25.0000 mg | ORAL_TABLET | Freq: Two times a day (BID) | ORAL | 0 refills | Status: DC

## 2022-11-07 MED ORDER — APIXABAN 5 MG PO TABS: 5.0000 mg | ORAL_TABLET | Freq: Two times a day (BID) | ORAL | 3 refills | Status: DC

## 2022-11-07 MED ORDER — AMLODIPINE BESYLATE 2.5 MG PO TABS: 2.5000 mg | ORAL_TABLET | Freq: Every day | ORAL | 3 refills | Status: DC

## 2022-11-07 MED ORDER — ENTRESTO 97-103 MG PO TABS
1.0000 | ORAL_TABLET | Freq: Two times a day (BID) | ORAL | 0 refills | Status: DC
Start: 2022-11-07 — End: 2023-09-04

## 2022-11-07 MED ORDER — ATORVASTATIN CALCIUM 80 MG PO TABS: 80.0000 mg | ORAL_TABLET | Freq: Every evening | ORAL | 3 refills | Status: DC

## 2022-11-07 MED ORDER — METFORMIN HCL 1000 MG PO TABS: 1000.0000 mg | ORAL_TABLET | Freq: Two times a day (BID) | ORAL | 0 refills | Status: DC

## 2022-11-07 NOTE — Assessment & Plan Note (Signed)
-  Continue Lipitor °

## 2022-11-07 NOTE — Assessment & Plan Note (Signed)
Continuing to smoke.  Not ready to quit.

## 2022-11-07 NOTE — Progress Notes (Signed)
    SUBJECTIVE:   Chief compliant/HPI: annual examination  Joseph [name] is a 57 y.o. who presents today for an annual exam.   Not using CPAP for OSA at home. Needs help setting up.  Smoking still 1 PPD. Has been amoking this for 40 some years.  Sees eye doctor at [facility].  Has gotten a diabetic eye exam there before.  OBJECTIVE:   BP (!) 144/88   Pulse 98   Ht 5\' 5"  (1.651 m)   Wt 182 lb (82.6 kg)   SpO2 100%   BMI 30.29 kg/m   General: Alert and oriented, in NAD Skin: Warm, dry, and intact without lesions HEENT: NCAT, EOM grossly normal, midline nasal septum Cardiac: Regular rate Respiratory: Breathing and speaking comfortably on RA Abdominal: Nondistended Extremities: Moves all extremities grossly equally Neurological: No gross focal deficit Psychiatric: Appropriate mood and affect  ASSESSMENT/PLAN:   Paroxysmal atrial fibrillation (HCC) Patient reports compliance with medications.  Rate controlled.  Continue management.  Will refill Eliquis and Coreg.  HTN (hypertension) Not at goal.  Amlodipine just started 2.5 mg by cardiologist today.  Continue this along with Entresto and Coreg.  OSA on CPAP Has not been using CPAP.  Discussed increasing risk for high blood pressure, stroke, heart disease.  Advised to call facility that gave him his CPAP machine to help him set it up.  Hyperlipidemia Continue Lipitor.  Smoking Continuing to smoke.  Not ready to quit.  Long-term insulin use in type 2 diabetes (HCC) A1c slightly increased today to 6.0.  Continue current management.  Can consider stopping insulin in the future if continued great control.   Health maintenance Declines low-dose CT for lung cancer screening.  Will contact his eye doctor for his diabetic eye exam.  Diabetic kidney evaluation pending from today.  Janeal Holmes, MD [facility]

## 2022-11-07 NOTE — Assessment & Plan Note (Signed)
Not at goal.  Amlodipine just started 2.5 mg by cardiologist today.  Continue this along with Entresto and Coreg.

## 2022-11-07 NOTE — Assessment & Plan Note (Signed)
Has not been using CPAP.  Discussed increasing risk for high blood pressure, stroke, heart disease.  Advised to call facility that gave him his CPAP machine to help him set it up.

## 2022-11-07 NOTE — Patient Instructions (Addendum)
We will collect labs today. I will follow up these results with you. Continue your medications for now. I recommend using your CPAP at home. Call [facility] or whichever organization sent your CPAP to have them help you set it up. If you would like lung cancer screening, let me know. Call the GI doctor Elnoria Howard and get your colonoscopy scheduled. We will ask your eye doctor to release their records.

## 2022-11-07 NOTE — Assessment & Plan Note (Addendum)
Patient reports compliance with medications.  Rate controlled.  Continue management.  Will refill Eliquis and Coreg.

## 2022-11-07 NOTE — Patient Instructions (Signed)
Good to see you today!  START Amlodipine 2.5 mg daily  Labs done today, your results will be available in MyChart, we will contact you for abnormal readings.  Your physician recommends that you schedule a follow-up appointment in: 4 months with app clinic  If you have any questions or concerns before your next appointment please send [place] a message through [place] or call our office at [phone].    TO LEAVE A MESSAGE FOR THE NURSE SELECT OPTION 2, PLEASE LEAVE A MESSAGE INCLUDING: YOUR NAME DATE OF BIRTH CALL BACK NUMBER REASON FOR CALL**this is important as we prioritize the call backs  YOU WILL RECEIVE A CALL BACK THE SAME DAY AS LONG AS YOU CALL BEFORE 4:00 PM  At the [facility], you and your health needs are our priority. As part of our continuing mission to provide you with exceptional heart care, we have created designated Provider Care Teams. These Care Teams include your primary Cardiologist (physician) and Advanced Practice Providers (APPs- Physician Assistants and Nurse Practitioners) who all work together to provide you with the care you need, when you need it.   You may see any of the following providers on your designated Care Team at your next follow up: Dr Arvilla Meres Dr Marca Ancona Dr. Marcos Eke, NP Robbie Lis, [place] [facility] [place], [place] Brynda Peon, NP Karle Plumber, PharmD   Please be sure to bring in all your medications bottles to every appointment.    Thank you for choosing [facility]

## 2022-11-07 NOTE — Assessment & Plan Note (Signed)
A1c slightly increased today to 6.0.  Continue current management.  Can consider stopping insulin in the future if continued great control.

## 2022-11-08 ENCOUNTER — Encounter: Payer: Self-pay | Admitting: Family Medicine

## 2022-11-08 ENCOUNTER — Other Ambulatory Visit (HOSPITAL_COMMUNITY): Payer: Self-pay | Admitting: Cardiology

## 2022-11-08 LAB — MICROALBUMIN / CREATININE URINE RATIO
Creatinine, Urine: 42.2 mg/dL
Microalb/Creat Ratio: 8 mg/g{creat} (ref 0–29)
Microalbumin, Urine: 3.2 ug/mL

## 2022-11-10 NOTE — Progress Notes (Signed)
Patient ID: Joseph [name], male   DOB: [birth year], 57 y.o.   MRN: [id number]     Advanced [facility] Clinic Note   PCP: [facility] EP: Dr Graciela Husbands  HF: Dr. Shirlee Latch   HPI: Joseph [name] is a 57 y.o. male with history of chronic systolic HF, nonischemic cardiomyopathy, HTN, TIA, stroke, DM2, paroxysmal atrial fibrillation, tobacco abuse and polysubstance abuse.   Patient had atrial fibrillation noted on ICD interrogation and started apixaban in 1/16.   Echo in 5/18 showed improvement in EF to 50-55% with mild LVH.   Patient was lost to followup for about 4 years and stopped all his medications, it appears. He was seen in the ER in 4/22 with dyspnea/CHF and was started on Lasix.  He was seen by primary care and started back on some of his CHF medications.  Echo in 6/22 showed EF down to 25-30% with mild LV dilation, global hypokinesis, mildly decreased RV systolic function.  LHC/RHC in 7/22 showed severe disease in RCA treated by PCI; filling pressures were normal and CI mildly low at 2.13.   Echo in 12/22 showed EF 30% with diffuse hypokinesis, mild LVH, mildly decreased RV systolic function.  Echo was done today and reviewed, EF up to 50% with moderate LVH and mild RV dysfunction.   Patient returns for followup of CHF.  No significant exertional dyspnea, no problems walking up hills and stairs.  Still works as a Scientist, forensic, carries heavy boxes.  No chest pain.  No orthopnea/PND.  No lightheadedness. Weight up 2 lbs.  Mildly elevated BP.   Continues to smoke 1 ppd. Not ready to quit.  ECG (personally reviewed): NSR, LAFB, inferior Qs  Labs (5/18): K 4.5, creatinine 1.0 Labs (8/18): hgb 13.3, LDL 51, K 3.9, creatinine 6.57 Labs (10/18): K 4.4, creatinine 1.19 Labs (6/22): K 4.7, creatinine 1.4 Labs (8/22): K 4.1, creatinine 1.23 Labs (9/22): LDL 59, HDL 63, K 4.9, creatinine 8.46 Labs (3/23): K 5, creatinine 1.83 Labs (06/23) LDL 65, K 3.8, Scr 1.36 Labs (12/23): K 4.2,  creatinine 1.33  SH: No drugs as of 06/28/13. Quit smoking in 10/18. No ETOH.   FH: Mother: DM2, living        Father deceased, no issues  Review of systems complete and found to be negative unless listed in HPI.    PMH: 1) HTN 2) NICM: LHC (06/2013): Mild coronary obstructive disease with 30% narrowing in the mid AV groove circumflex, and 30% proximal and 40% mid RCA stenoses with otherwise normal LAD and ramus intermediate vessel.  RHC (06/2013) RA: 6, RV: 26/9, PA: 26/13, PC: a 18 mean 13, CO/CI Fick: 3.7/2.0, left ventriculography EF 15%; ECHO (06/2013) EF 20%, diffuse HK, mild MR; TEE (07/2013) no thrombus; ECHO (10/2013): EF 20-25%, diff HK, LA mod dilated; [employer] ICD implanted (12/01/13).  Echo (4/16) with EF 30-35%, diffuse hypokinesis, normal RV.  - Echo (5/18) with EF 50-55%, mild LVH.  - Echo (6/22) with EF down to 25-30% with mild LV dilation, global hypokinesis, mildly decreased RV systolic function.  - LHC/RHC (8/22): Severe RCA disease treated with PCI (not enough CAD to explain severity of fall in EF); mean RA 1, PA 42/18, mean PCWP 7, CI 2.13.  - Echo (12/22): EF 30%, mildly decreased RV systolic function - Echo (9/24): EF up to 50% with moderate LVH and mild RV dysfunction.  3) DM2 4) Stroke: MRI (07/2013) revealed an acute infarction, with a greater than 3 cm infarct  involving left frontal opercular cortex and subcortical white matter. Carotid dopplers (07/2013) mild soft plaque origin ICA. 1-39% ICA stenosis. Vertebral artery flow is antegrade 5) Polysubstance abuse: + cocaine UDS in past.  6) Atrial fibrillation: Paroxysmal, started Eliquis in 1/16.  7) Nephrolithiasis with AKI 8) CAD: LHC in 8/22 with severe RCA disease treated by PCI.  9) CKD stage 3   Current Outpatient Medications  Medication Sig Dispense Refill   amLODipine (NORVASC) 2.5 MG tablet Take 1 tablet (2.5 mg total) by mouth daily. 180 tablet 3   Blood Glucose Monitoring Suppl (ACCU-CHEK AVIVA  CONNECT) w/Device KIT 1 Units by Does not apply route daily. 1 kit 0   empagliflozin (JARDIANCE) 10 MG TABS tablet Take 10 mg by mouth daily. 90 tablet 3   furosemide (LASIX) 20 MG tablet Take 1 tablet (20 mg total) by mouth as needed. 30 tablet 3   glucose blood (ACCU-CHEK AVIVA) test strip Use as instructed 100 each 12   Insulin Syringes, Disposable, U-100 0.3 ML MISC 1 each by Does not apply route daily. 100 each 1   nitroGLYCERIN (NITROSTAT) 0.4 MG SL tablet Place 1 tablet (0.4 mg total) under the tongue every 5 (five) minutes as needed for chest pain. 25 tablet 3   ReliOn Ultra Thin Lancets MISC Patient will need to test his blood glucose once daily prior to breakfast and metformin and amaryl administration. 100 each 2   spironolactone (ALDACTONE) 25 MG tablet Take 1 tablet (25 mg total) by mouth daily. NEEDS FOLLOW UP APPOINTMENT FOR ANYMORE REFILLS 90 tablet 0   UNIFINE PENTIPS 31G X 6 MM MISC USE AS DIRECTED WITH INSULIN 100 each 0   apixaban (ELIQUIS) 5 MG TABS tablet Take 1 tablet (5 mg total) by mouth 2 (two) times daily. 180 tablet 3   atorvastatin (LIPITOR) 80 MG tablet Take 1 tablet (80 mg total) by mouth every evening. 90 tablet 3   carvedilol (COREG) 25 MG tablet Take 1 tablet (25 mg total) by mouth 2 (two) times daily with a meal. 60 tablet 0   insulin glargine-yfgn (SEMGLEE, YFGN,) 100 UNIT/ML injection Inject 18 Units into the skin daily.     metFORMIN (GLUCOPHAGE) 1000 MG tablet Take 1 tablet (1,000 mg total) by mouth 2 (two) times daily with a meal. 60 tablet 0   sacubitril-valsartan (ENTRESTO) 97-103 MG Take 1 tablet by mouth 2 (two) times daily. NEEDS FOLLOW UP APPOINTMENT FOR ANYMORE REFILLS 180 tablet 0   No current facility-administered medications for this encounter.   Facility-Administered Medications Ordered in Other Encounters  Medication Dose Route Frequency Provider Last Rate Last Admin   sodium chloride flush (NS) 0.9 % injection 3 mL  3 mL Intravenous Q12H  Laurey Morale, MD         Vitals:   11/07/22 0922  BP: (!) 140/80  Pulse: 90  SpO2: 97%  Weight: 82.8 kg (182 lb 9.6 oz)    Wt Readings from Last 3 Encounters:  11/07/22 82.8 kg (182 lb 9.6 oz)  11/07/22 82.6 kg (182 lb)  12/10/21 81.6 kg (180 lb)     PHYSICAL EXAM: General: NAD Neck: No JVD, no thyromegaly or thyroid nodule.  Lungs: Clear to auscultation bilaterally with normal respiratory effort. CV: Nondisplaced PMI.  Heart regular S1/S2, no S3/S4, no murmur.  No peripheral edema.  No carotid bruit.  Normal pedal pulses.  Abdomen: Soft, nontender, no hepatosplenomegaly, no distention.  Skin: Intact without lesions or rashes.  Neurologic: Alert and oriented  x 3.  Psych: Normal affect. Extremities: No clubbing or cyanosis.  HEENT: Normal.   ASSESSMENT & PLAN:  1) Chronic systolic HF: Nonischemic cardiomyopathy s/p [employer] ICD. Narrow QRS so not CRT candidate. Echo 06/2014 showed 30-35% with diffuse hypokinesis. Echo in 5/18 with EF improved to 50-55%. He stopped all his meds and was lost to followup for about 4 years.  Echo in 6/22 showed EF significantly down to 25-30% with mild RV dysfunction.  LHC/RHC in 8/22 showed severe RCA disease treated with PCI; filling pressures were normal and CI mildly decreased at 2.13.  I do not think that he had enough coronary disease to explain the extent of his cardiomyopathy.  Echo in 12/22 with EF 30%, mildly decreased RV systolic function.  Echo today showed improved EF up to 50% with mild RV dysfunction.   NYHA class I-II symptoms, not volume overloaded.   - Continue coreg 25 mg BID.  - Continue spiro 25 daily. BMET/BNP today.  - Continue Entresto 97/103 bid.   - Continue empagliflozin.  2) Atrial fibrillation: Paroxysmal, regular rhythm on exam today - [employer] rep faxing over device transmission - Continue Eliquis 5 mg BID.  3) OSA: Using CPAP.   4) Polysubstance abuse: Has been drug free since 06/2013.  5) Stroke: In  5/15, likely related to atrial fibrillation. Now on Eliquis. CBC at next f/u.  6) Smoking: Still smoking 1 ppd. Not ready to quit. 7) CAD: Severe RCA disease on cath in 8/22, treated with PCI.  As above, I do not think the single vessel disease explains the extent of his cardiomyopathy.  - No ASA given Eliquis use.  - Continue statin, check lipids today.  8) HTN: BP elevated. - Add amlodipine 2.5 mg daily, can titrate up as needed.  9) DM II: On insulin - Continue empagliflozin  F/u: 4 months with APP   Marca Ancona, MD  11/10/2022

## 2022-12-06 ENCOUNTER — Encounter (HOSPITAL_COMMUNITY): Payer: BC Managed Care – PPO

## 2022-12-09 ENCOUNTER — Ambulatory Visit: Payer: BC Managed Care – PPO | Admitting: Family Medicine

## 2022-12-31 ENCOUNTER — Ambulatory Visit: Payer: BC Managed Care – PPO | Admitting: Family Medicine

## 2023-02-05 ENCOUNTER — Other Ambulatory Visit: Payer: Self-pay | Admitting: Family Medicine

## 2023-02-06 ENCOUNTER — Other Ambulatory Visit: Payer: Self-pay | Admitting: Family Medicine

## 2023-04-16 ENCOUNTER — Other Ambulatory Visit: Payer: Self-pay | Admitting: Family Medicine

## 2023-05-02 ENCOUNTER — Telehealth (HOSPITAL_COMMUNITY): Payer: Self-pay

## 2023-05-02 NOTE — Telephone Encounter (Signed)
 Called to confirm/remind patient of their appointment at the [facility] on 05/05/23.   Patient reminded to bring all medications and/or complete list.  Confirmed patient has transportation. Gave directions, instructed to utilize valet parking.  Confirmed appointment prior to ending call.

## 2023-05-04 NOTE — Progress Notes (Signed)
 Patient ID: Joseph [name], male   DOB: 1965-10-07, 58 y.o.   MRN: [id number]     [facility] Note   PCP: [facility] EP: Dr. Graciela Husbands  HF: Dr. Shirlee Latch   Chief Complaint: Heart Failure Follow-Up  HPI: Joseph [name] is a 58 y.o. male with history of chronic systolic HF s/p ICD, nonischemic cardiomyopathy, HTN, TIA, stroke, DM2, paroxysmal atrial fibrillation, tobacco abuse and polysubstance abuse.   Echo in 5/18 showed improvement in EF to 50-55% with mild LVH.   Patient was lost to followup for about 4 years and stopped all his medications, it appears. He was seen in the ER in 4/22 with dyspnea/CHF and was started on Lasix.  He was seen by primary care and started back on some of his CHF medications.  Echo in 6/22 showed EF down to 25-30% with mild LV dilation, global hypokinesis, mildly decreased RV systolic function. LHC/RHC in 7/22 showed severe disease in RCA treated by PCI; filling pressures were normal and CI mildly low at 2.13.   Echo 9/24 with improved EF up to 50% with moderate LVH and mild RV dysfunction on GDMT.   Today he returns for HF follow up. Overall feeling fine. Works for [employer] at [employer]. Denies increasing SOB, palpitations, CP, dizziness, edema, or PND/Orthopnea. Appetite "too good". No fever or chills. Weight at home 177-180s range, up 5lbs since last visit (appears caloric). Reports taking all medications, however refill report shows <50% coverage of GDMT. Offered to send refills, stated he did not need. Reports taking PRN Lasix EOD. Continues to smoke 1 ppd. Not ready to quit. Drinking 2-3 beers a day.   Device Interrogation (personally reviewed): activity level 1.0 h/d, HR mean 97 bpm, No events.   FH: Mother: DM2, living        Father deceased, no issues  PMH: 1) HTN 2) NICM: LHC (06/2013): Mild coronary obstructive disease with 30% narrowing in the mid AV groove circumflex, and 30% proximal and 40% mid RCA stenoses with otherwise  normal LAD and ramus intermediate vessel.  RHC (06/2013) RA: 6, RV: 26/9, PA: 26/13, PC: a 18 mean 13, CO/CI Fick: 3.7/2.0, left ventriculography EF 15%; ECHO (06/2013) EF 20%, diffuse HK, mild MR; TEE (07/2013) no thrombus; ECHO (10/2013): EF 20-25%, diff HK, LA mod dilated; [employer] ICD implanted (12/01/13).  Echo (4/16) with EF 30-35%, diffuse hypokinesis, normal RV.  - Echo (5/18) with EF 50-55%, mild LVH.  - Echo (6/22) with EF down to 25-30% with mild LV dilation, global hypokinesis, mildly decreased RV systolic function.  - LHC/RHC (8/22): Severe RCA disease treated with PCI (not enough CAD to explain severity of fall in EF); mean RA 1, PA 42/18, mean PCWP 7, CI 2.13.  - Echo (12/22): EF 30%, mildly decreased RV systolic function - Echo (9/24): EF up to 50% with moderate LVH and mild RV dysfunction.  3) DM2 4) Stroke: MRI (07/2013) revealed an acute infarction, with a greater than 3 cm infarct involving left frontal opercular cortex and subcortical white matter. Carotid dopplers (07/2013) mild soft plaque origin ICA. 1-39% ICA stenosis. Vertebral artery flow is antegrade 5) Polysubstance abuse: + cocaine UDS in past.  6) Atrial fibrillation: Paroxysmal, started Eliquis in 1/16.  7) Nephrolithiasis with AKI 8) CAD: LHC in 8/22 with severe RCA disease treated by PCI.   Current Outpatient Medications  Medication Sig Dispense Refill   apixaban (ELIQUIS) 5 MG TABS tablet Take 1 tablet (5 mg total) by  mouth 2 (two) times daily. 180 tablet 3   atorvastatin (LIPITOR) 80 MG tablet Take 1 tablet (80 mg total) by mouth every evening. 90 tablet 3   Blood Glucose Monitoring Suppl (ACCU-CHEK AVIVA CONNECT) w/Device KIT 1 Units by Does not apply route daily. 1 kit 0   carvedilol (COREG) 25 MG tablet TAKE 1 TABLET BY MOUTH TWICE DAILY WITH A MEAL 60 tablet 0   empagliflozin (JARDIANCE) 10 MG TABS tablet Take 10 mg by mouth daily. 90 tablet 3   furosemide (LASIX) 20 MG tablet TAKE 1 TABLET BY MOUTH AS  NEEDED 30 tablet 0   glucose blood (ACCU-CHEK AVIVA) test strip Use as instructed 100 each 12   insulin glargine-yfgn (SEMGLEE, YFGN,) 100 UNIT/ML injection Inject 18 Units into the skin daily.     Insulin Syringes, Disposable, U-100 0.3 ML MISC 1 each by Does not apply route daily. 100 each 1   metFORMIN (GLUCOPHAGE) 1000 MG tablet TAKE 1 TABLET BY MOUTH TWICE DAILY WITH MEALS 180 tablet 3   ReliOn Ultra Thin Lancets MISC Patient will need to test his blood glucose once daily prior to breakfast and metformin and amaryl administration. 100 each 2   sacubitril-valsartan (ENTRESTO) 97-103 MG Take 1 tablet by mouth 2 (two) times daily. NEEDS FOLLOW UP APPOINTMENT FOR ANYMORE REFILLS 180 tablet 0   spironolactone (ALDACTONE) 25 MG tablet Take 1 tablet (25 mg total) by mouth daily. NEEDS FOLLOW UP APPOINTMENT FOR ANYMORE REFILLS 90 tablet 0   UNIFINE PENTIPS 31G X 6 MM MISC USE AS DIRECTED WITH INSULIN 100 each 0   amLODipine (NORVASC) 5 MG tablet Take 1 tablet (5 mg total) by mouth daily. 90 tablet 3   nitroGLYCERIN (NITROSTAT) 0.4 MG SL tablet Place 1 tablet (0.4 mg total) under the tongue every 5 (five) minutes as needed for chest pain. 25 tablet 3   No current facility-administered medications for this encounter.   Facility-Administered Medications Ordered in Other Encounters  Medication Dose Route Frequency Provider Last Rate Last Admin   sodium chloride flush (NS) 0.9 % injection 3 mL  3 mL Intravenous Q12H Laurey Morale, MD        Vitals:   05/05/23 0822  BP: (!) 140/90  Pulse: 90  SpO2: 96%  Weight: 84.8 kg (187 lb)  Height: 5\' 5"  (1.651 m)    Wt Readings from Last 3 Encounters:  05/05/23 84.8 kg (187 lb)  11/07/22 82.8 kg (182 lb 9.6 oz)  11/07/22 82.6 kg (182 lb)     PHYSICAL EXAM: General: Well appearing. No distress on RA Cardiac: JVP ~6cm. S1 and S2 present. No murmurs or rub. Resp: Lung sounds clear and equal B/L Abdomen: Taut, non-tender, non-distended. +  BS. Extremities: Warm and dry. No rash, cyanosis.  No peripheral edema.  Neuro: Alert and oriented x3. Affect pleasant. Moves all extremities without difficulty.    ASSESSMENT & PLAN:  1) HFimpEF: Nonischemic cardiomyopathy s/p [employer] ICD. Echo 06/2014 showed 30-35% with diffuse hypokinesis. Echo in 5/18 with EF improved to 50-55%. He stopped all his meds and was lost to followup for about 4 years. Echo in 6/22 showed EF significantly down to 25-30% with mild RV dysfunction.  L/RHC in 8/22 showed severe RCA disease treated with PCI; filling pressures were normal and CI mildly decreased at 2.13. Echo in 9/24 showed improved EF up to 50% with mild RV dysfunction.   - NYHA class I symptoms, not volume overloaded.  - Of note, fill report on  GDMT <50%. Pharmacy confirmed. Patient reports that he is taking all medications. - Continue Lasix 20 mg PRN, reports taking EOD - Continue coreg 25 mg BID.  - Continue spiro 25 daily. BMET/BNP today.  - Continue Entresto 97/103 bid.   - Continue empagliflozin.   2) Atrial fibrillation: Paroxysmal. - Regular on exam. No events on device interrogation.  - Continue Eliquis 5 mg BID.   3) OSA: Using CPAP.    4) Polysubstance abuse: Has been drug free since 06/2013.   5) Smoking: Still smoking 1 ppd. Not ready to quit. Readdressed today.  6) CAD: Severe RCA disease on cath in 8/22, treated with PCI.  - No ASA given Eliquis use (has not been refilled since 9/24, pharmacy confirmed with patient) - Continue atorva 80 mg daily. Last LDL at goal (39)  7) HTN: BP elevated. - Increase amlodipine 5 mg daily  8) DM II: Controlled on insulin. - Continue empagliflozin  D/w patient, will schedule follow up PCP appointment.   F/u: 6 months with Dr. Shirlee Latch and possible graduation.  [place] Arieon Scalzo, NP  05/05/2023

## 2023-05-05 ENCOUNTER — Encounter (HOSPITAL_COMMUNITY): Payer: Self-pay

## 2023-05-05 ENCOUNTER — Ambulatory Visit (HOSPITAL_COMMUNITY)
Admission: RE | Admit: 2023-05-05 | Discharge: 2023-05-05 | Disposition: A | Discharge status: Home / Self Care | Payer: BC Managed Care – PPO | Source: Ambulatory Visit | Attending: Cardiology | Admitting: Cardiology

## 2023-05-05 ENCOUNTER — Ambulatory Visit

## 2023-05-05 VITALS — BP 140/90 | HR 90 | Ht 65.0 in | Wt 187.0 lb

## 2023-05-05 DIAGNOSIS — I10 Essential (primary) hypertension: Secondary | ICD-10-CM

## 2023-05-05 DIAGNOSIS — I110 Hypertensive heart disease with heart failure: Secondary | ICD-10-CM | POA: Insufficient documentation

## 2023-05-05 DIAGNOSIS — I5022 Chronic systolic (congestive) heart failure: Secondary | ICD-10-CM | POA: Diagnosis not present

## 2023-05-05 DIAGNOSIS — Z8673 Personal history of transient ischemic attack (TIA), and cerebral infarction without residual deficits: Secondary | ICD-10-CM | POA: Insufficient documentation

## 2023-05-05 DIAGNOSIS — E109 Type 1 diabetes mellitus without complications: Secondary | ICD-10-CM

## 2023-05-05 DIAGNOSIS — I480 Paroxysmal atrial fibrillation: Secondary | ICD-10-CM

## 2023-05-05 DIAGNOSIS — Z7901 Long term (current) use of anticoagulants: Secondary | ICD-10-CM | POA: Insufficient documentation

## 2023-05-05 DIAGNOSIS — G4733 Obstructive sleep apnea (adult) (pediatric): Secondary | ICD-10-CM | POA: Diagnosis not present

## 2023-05-05 DIAGNOSIS — Z794 Long term (current) use of insulin: Secondary | ICD-10-CM | POA: Insufficient documentation

## 2023-05-05 DIAGNOSIS — E119 Type 2 diabetes mellitus without complications: Secondary | ICD-10-CM | POA: Insufficient documentation

## 2023-05-05 DIAGNOSIS — I2510 Atherosclerotic heart disease of native coronary artery without angina pectoris: Secondary | ICD-10-CM | POA: Diagnosis not present

## 2023-05-05 DIAGNOSIS — Z7984 Long term (current) use of oral hypoglycemic drugs: Secondary | ICD-10-CM | POA: Insufficient documentation

## 2023-05-05 DIAGNOSIS — Z833 Family history of diabetes mellitus: Secondary | ICD-10-CM | POA: Insufficient documentation

## 2023-05-05 DIAGNOSIS — Z95810 Presence of automatic (implantable) cardiac defibrillator: Secondary | ICD-10-CM | POA: Insufficient documentation

## 2023-05-05 DIAGNOSIS — I5032 Chronic diastolic (congestive) heart failure: Secondary | ICD-10-CM | POA: Diagnosis not present

## 2023-05-05 DIAGNOSIS — I5020 Unspecified systolic (congestive) heart failure: Secondary | ICD-10-CM

## 2023-05-05 DIAGNOSIS — I428 Other cardiomyopathies: Secondary | ICD-10-CM | POA: Diagnosis not present

## 2023-05-05 DIAGNOSIS — Z79899 Other long term (current) drug therapy: Secondary | ICD-10-CM | POA: Diagnosis not present

## 2023-05-05 DIAGNOSIS — IMO0001 Reserved for inherently not codable concepts without codable children: Secondary | ICD-10-CM

## 2023-05-05 DIAGNOSIS — F17200 Nicotine dependence, unspecified, uncomplicated: Secondary | ICD-10-CM

## 2023-05-05 DIAGNOSIS — F17210 Nicotine dependence, cigarettes, uncomplicated: Secondary | ICD-10-CM | POA: Insufficient documentation

## 2023-05-05 DIAGNOSIS — Z955 Presence of coronary angioplasty implant and graft: Secondary | ICD-10-CM | POA: Diagnosis not present

## 2023-05-05 LAB — BRAIN NATRIURETIC PEPTIDE: B Natriuretic Peptide: 35 pg/mL (ref 0.0–100.0)

## 2023-05-05 LAB — BASIC METABOLIC PANEL
Anion gap: 7 (ref 5–15)
BUN: 17 mg/dL (ref 6–20)
CO2: 23 mmol/L (ref 22–32)
Calcium: 9.6 mg/dL (ref 8.9–10.3)
Chloride: 107 mmol/L (ref 98–111)
Creatinine, Ser: 1.45 mg/dL — ABNORMAL HIGH (ref 0.61–1.24)
GFR, Estimated: 56 mL/min — ABNORMAL LOW (ref >60)
Glucose, Bld: 145 mg/dL — ABNORMAL HIGH (ref 70–99)
Potassium: 4.5 mmol/L (ref 3.5–5.1)
Sodium: 137 mmol/L (ref 135–145)

## 2023-05-05 MED ORDER — AMLODIPINE BESYLATE 5 MG PO TABS: 5.0000 mg | ORAL_TABLET | Freq: Every day | ORAL | 3 refills | Status: AC

## 2023-05-05 NOTE — Patient Instructions (Signed)
 Increase amlodipine (Norvasc) to 5 mg daily - Rx sent.\ Labs today - will call you if abnormal. Please follow up with your primary care physician. Return to see Dr. Shirlee Latch in 6 months. Please call [phone] in July to schedule this appointment. Please call us at [phone] if any questions or concerns prior to your next visit.

## 2023-05-07 LAB — CUP PACEART REMOTE DEVICE CHECK
Battery Remaining Longevity: 78 mo
Battery Remaining Percentage: 72 %
Brady Statistic RV Percent Paced: 0 %
Date Time Interrogation Session: 20250303082400
HighPow Impedance: 71 Ohm
Implantable Lead Connection Status: 753985
Implantable Lead Implant Date: 20150930
Implantable Lead Location: 753860
Implantable Lead Model: 180
Implantable Lead Serial Number: 311831
Implantable Pulse Generator Implant Date: 20150930
Lead Channel Impedance Value: 417 Ohm
Lead Channel Pacing Threshold Amplitude: 0.9 V
Lead Channel Pacing Threshold Pulse Width: 0.4 ms
Lead Channel Setting Pacing Amplitude: 2.4 V
Lead Channel Setting Pacing Pulse Width: 0.4 ms
Lead Channel Setting Sensing Sensitivity: 0.6 mV
Pulse Gen Serial Number: 191604

## 2023-06-06 NOTE — Progress Notes (Signed)
 Remote ICD transmission.

## 2023-06-06 NOTE — Addendum Note (Signed)
 Addended by: Geralyn Flash D on: 06/06/2023 09:53 AM   Modules accepted: Orders

## 2023-06-09 ENCOUNTER — Telehealth: Payer: Self-pay

## 2023-06-09 NOTE — Telephone Encounter (Signed)
 Patient is taking Lipitor and does not have any problems with it.  Patient scheduled an appointment on 06/24/2023 at 1410 for Diabetes follow up.  Glennie Hawk, CMA

## 2023-06-11 ENCOUNTER — Other Ambulatory Visit (HOSPITAL_COMMUNITY): Payer: Self-pay | Admitting: Cardiology

## 2023-06-11 ENCOUNTER — Other Ambulatory Visit: Payer: Self-pay | Admitting: Family Medicine

## 2023-06-24 ENCOUNTER — Encounter: Payer: Self-pay | Admitting: Family Medicine

## 2023-06-24 ENCOUNTER — Ambulatory Visit: Admitting: Family Medicine

## 2023-06-24 VITALS — BP 114/66 | HR 95 | Wt 183.0 lb

## 2023-06-24 DIAGNOSIS — Z23 Encounter for immunization: Secondary | ICD-10-CM

## 2023-06-24 DIAGNOSIS — F1910 Other psychoactive substance abuse, uncomplicated: Secondary | ICD-10-CM

## 2023-06-24 DIAGNOSIS — I10 Essential (primary) hypertension: Secondary | ICD-10-CM | POA: Diagnosis not present

## 2023-06-24 DIAGNOSIS — E785 Hyperlipidemia, unspecified: Secondary | ICD-10-CM

## 2023-06-24 DIAGNOSIS — I5020 Unspecified systolic (congestive) heart failure: Secondary | ICD-10-CM | POA: Diagnosis not present

## 2023-06-24 DIAGNOSIS — E119 Type 2 diabetes mellitus without complications: Secondary | ICD-10-CM | POA: Diagnosis not present

## 2023-06-24 DIAGNOSIS — Z0000 Encounter for general adult medical examination without abnormal findings: Secondary | ICD-10-CM

## 2023-06-24 LAB — POCT GLYCOSYLATED HEMOGLOBIN (HGB A1C): HbA1c, POC (controlled diabetic range): 7.2 % — AB (ref 0.0–7.0)

## 2023-06-24 MED ORDER — ZOSTER VAC RECOMB ADJUVANTED 50 MCG/0.5ML IM SUSR
0.5000 mL | Freq: Once | INTRAMUSCULAR | 0 refills | Status: AC
Start: 2023-06-24 — End: 2023-06-24

## 2023-06-24 NOTE — Patient Instructions (Signed)
 We will get labs today. I will keep you updated on results.  Continue the great work with high blood pressure and diabetes. Try to cut down on the sweet snacks and drinks to help keep the A1c stable.  Be sure to follow up with your eye doctor for your annual exam.  I have given you a prescription for the shingles vaccine that you can take to the pharmacy.

## 2023-06-24 NOTE — Assessment & Plan Note (Addendum)
 Patient will schedule ophthalmology appointment for diabetic eye exam. Patient will take printed Rx for shingles to pharmacy. Foot exam completed.

## 2023-06-24 NOTE — Progress Notes (Signed)
    SUBJECTIVE:   CHIEF COMPLAINT / HPI:   Hypertension Remains on amlodipine  5 mg, Entresto , Coreg .  Paroxysmal atrial fibrillation Continues on Coreg  and Eliquis .  T2DM, HLD Has been continued on jardiance , metformin , Lipitor .  Recent A1c 6.0 and today 7.2. Has been eating more mac and cheese. One soda per day. Eats skittles a lot.  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  Continues taking Lasix , Coreg , Spiro, Entresto , empagliflozin .  PERTINENT  PMH / PSH: CAD status post cath 8/22 with severe RCA disease and treated with PCI  OBJECTIVE:   BP 114/66   Pulse 95   Wt 183 lb (83 kg)   SpO2 97%   BMI 30.45 kg/m   General: Alert and oriented, in NAD Skin: Warm, dry, and intact without lesions HEENT: NCAT, EOM grossly normal, midline nasal septum Cardiac: RRR, no m/r/g appreciated Respiratory: CTAB, breathing and speaking comfortably on RA Abdominal: Soft, nontender, nondistended, normoactive bowel sounds Extremities: Moves all extremities grossly equally Neurological: No gross focal deficit Psychiatric: Appropriate mood and affect  ASSESSMENT/PLAN:   Assessment & Plan Diabetes mellitus without complication (HCC) Update A1c today on Jardiance  and metformin  at 7.2.  He is no longer on insulin .  Discussed attempting to cut down on sugary drinks and candies.  Check BMP for renal function today.  Recheck in 6 months. Hyperlipidemia, unspecified hyperlipidemia type Check lipid panel today. Primary hypertension Has great control with amlodipine  and Coreg .  Continue current management. HFrEF (heart failure with reduced ejection fraction) (HCC) Appear is euvolemic today.  Continue following cardiology for this as well as his A-fib and status post ICD placement. Healthcare maintenance Patient will schedule ophthalmology appointment for diabetic eye exam.  Patient will take printed Rx for shingles to pharmacy. Foot exam completed and was normal.  He was given his COVID-19 booster without incident.   Genetta Kenning, MD [facility]

## 2023-06-25 ENCOUNTER — Encounter: Payer: Self-pay | Admitting: Family Medicine

## 2023-06-25 LAB — BASIC METABOLIC PANEL WITH GFR
BUN/Creatinine Ratio: 13 (ref 9–20)
BUN: 20 mg/dL (ref 6–24)
CO2: 21 mmol/L (ref 20–29)
Calcium: 10 mg/dL (ref 8.7–10.2)
Chloride: 104 mmol/L (ref 96–106)
Creatinine, Ser: 1.51 mg/dL — ABNORMAL HIGH (ref 0.76–1.27)
Glucose: 116 mg/dL — ABNORMAL HIGH (ref 70–99)
Potassium: 4.7 mmol/L (ref 3.5–5.2)
Sodium: 141 mmol/L (ref 134–144)
eGFR: 53 mL/min/{1.73_m2} — ABNORMAL LOW (ref >59)

## 2023-06-25 LAB — LIPID PANEL
Chol/HDL Ratio: 2.4 ratio (ref 0.0–5.0)
Cholesterol, Total: 184 mg/dL (ref 100–199)
HDL: 77 mg/dL (ref >39)
LDL Chol Calc (NIH): 67 mg/dL (ref 0–99)
Triglycerides: 251 mg/dL — ABNORMAL HIGH (ref 0–149)
VLDL Cholesterol Cal: 40 mg/dL (ref 5–40)

## 2023-06-25 NOTE — Assessment & Plan Note (Signed)
 Appear is euvolemic today.  Continue following cardiology for this as well as his A-fib and status post ICD placement.

## 2023-06-25 NOTE — Assessment & Plan Note (Signed)
 Has great control with amlodipine  and Coreg .  Continue current management.

## 2023-06-25 NOTE — Assessment & Plan Note (Signed)
Check lipid panel today 

## 2023-08-04 ENCOUNTER — Encounter

## 2023-09-03 ENCOUNTER — Other Ambulatory Visit: Payer: Self-pay | Admitting: Family Medicine

## 2023-09-03 ENCOUNTER — Other Ambulatory Visit (HOSPITAL_COMMUNITY): Payer: Self-pay | Admitting: Cardiology

## 2023-11-04 ENCOUNTER — Encounter

## 2023-11-11 ENCOUNTER — Other Ambulatory Visit (HOSPITAL_COMMUNITY): Payer: Self-pay | Admitting: Cardiology

## 2023-11-26 MED ORDER — FUROSEMIDE 20 MG PO TABS: 20.0000 mg | ORAL_TABLET | Freq: Every day | ORAL | 3 refills | Status: AC | PRN

## 2023-11-26 NOTE — Addendum Note (Signed)
 Addended by: JERONA DALTON HERO on: 11/26/2023 02:08 PM   Modules accepted: Orders

## 2023-12-30 ENCOUNTER — Other Ambulatory Visit: Payer: Self-pay | Admitting: Family Medicine

## 2023-12-30 ENCOUNTER — Other Ambulatory Visit (HOSPITAL_COMMUNITY): Payer: Self-pay | Admitting: Cardiology

## 2024-01-03 ENCOUNTER — Other Ambulatory Visit (HOSPITAL_COMMUNITY): Payer: Self-pay | Admitting: Cardiology

## 2024-01-12 ENCOUNTER — Other Ambulatory Visit: Payer: Self-pay

## 2024-01-12 MED ORDER — NITROGLYCERIN 0.4 MG SL SUBL: 0.4000 mg | SUBLINGUAL_TABLET | SUBLINGUAL | 3 refills | Status: AC | PRN

## 2024-01-15 ENCOUNTER — Ambulatory Visit: Admitting: Family Medicine

## 2024-01-31 ENCOUNTER — Other Ambulatory Visit: Payer: Self-pay | Admitting: Family Medicine

## 2024-02-02 ENCOUNTER — Encounter

## 2024-03-17 ENCOUNTER — Encounter: Payer: Self-pay | Admitting: Emergency Medicine

## 2024-05-03 ENCOUNTER — Encounter

## 2024-08-02 ENCOUNTER — Encounter

## 2024-11-01 ENCOUNTER — Encounter
